# Patient Record
Sex: Female | Born: 1955
Health system: Southern US, Community
[De-identification: ages and names within clinical notes are randomized; demographics above are authoritative.]

## PROBLEM LIST (undated history)

## (undated) DIAGNOSIS — Z9889 Other specified postprocedural states: Secondary | ICD-10-CM

## (undated) DIAGNOSIS — R112 Nausea with vomiting, unspecified: Secondary | ICD-10-CM

## (undated) DIAGNOSIS — I219 Acute myocardial infarction, unspecified: Secondary | ICD-10-CM

## (undated) DIAGNOSIS — K219 Gastro-esophageal reflux disease without esophagitis: Secondary | ICD-10-CM

## (undated) DIAGNOSIS — T7840XA Allergy, unspecified, initial encounter: Secondary | ICD-10-CM

## (undated) DIAGNOSIS — T8859XA Other complications of anesthesia, initial encounter: Secondary | ICD-10-CM

## (undated) DIAGNOSIS — E119 Type 2 diabetes mellitus without complications: Secondary | ICD-10-CM

## (undated) DIAGNOSIS — E785 Hyperlipidemia, unspecified: Secondary | ICD-10-CM

## (undated) DIAGNOSIS — I10 Essential (primary) hypertension: Secondary | ICD-10-CM

## (undated) DIAGNOSIS — I2510 Atherosclerotic heart disease of native coronary artery without angina pectoris: Secondary | ICD-10-CM

## (undated) HISTORY — DX: Acute myocardial infarction, unspecified: I21.9

## (undated) HISTORY — PX: CARDIAC CATHETERIZATION: SHX172

## (undated) HISTORY — PX: OTHER SURGICAL HISTORY: SHX169

## (undated) HISTORY — DX: Allergy, unspecified, initial encounter: T78.40XA

## (undated) HISTORY — DX: Type 2 diabetes mellitus without complications: E11.9

## (undated) HISTORY — DX: Hyperlipidemia, unspecified: E78.5

## (undated) HISTORY — DX: Essential (primary) hypertension: I10

## (undated) HISTORY — DX: Gastro-esophageal reflux disease without esophagitis: K21.9

---

## 1990-03-11 HISTORY — PX: CHOLECYSTECTOMY: SHX55

## 2004-03-11 HISTORY — PX: LAMINECTOMY: SHX219

## 2016-03-11 HISTORY — PX: COLONOSCOPY: SHX174

## 2016-10-04 LAB — HM COLONOSCOPY

## 2018-05-07 ENCOUNTER — Encounter: Payer: Self-pay | Admitting: Neurology

## 2018-05-07 ENCOUNTER — Ambulatory Visit (INDEPENDENT_AMBULATORY_CARE_PROVIDER_SITE_OTHER): Payer: BLUE CROSS/BLUE SHIELD | Admitting: Neurology

## 2018-05-07 VITALS — BP 133/88 | HR 78 | Ht 65.5 in | Wt 153.0 lb

## 2018-05-07 DIAGNOSIS — H02403 Unspecified ptosis of bilateral eyelids: Secondary | ICD-10-CM | POA: Diagnosis not present

## 2018-05-07 NOTE — Progress Notes (Signed)
Reason for visit: Ptosis  Referring physician: Dr. Claybon Jabs is a 63 y.o. female  History of present illness:  Ms. Busalacchi is a 63 year old right-handed white female who noted onset of some discomfort around the right eye about a year ago, she noted swelling around the eye, she has had some problems with ptosis when she becomes very tired that may actually close the eye down.  She had more recently over the last several months have noted some problems with the left eye as well, the left eye will become droopy and will close down completely to the point where she cannot see out of the eye.  She has noted some generalized blurred vision out of both eyes that does not improve with covering one eye or the other.  The patient denies any numbness of the face or the extremities, she has had prior lumbosacral spine surgery and had a right foot drop around the time of surgery that has improved.  The patient does have some increase in generalized fatigue recently.  She has just moved from the [place], [place] area and will be seeing her primary doctor for the first time in the near future.  She gives a history of ehrlichiosis and Lyme disease that was treated several years ago.  The patient was sent to this office for an evaluation of possible myasthenia gravis.  Past Medical History:  Diagnosis Date  . Diabetes mellitus without complication (HCC)   . HLD (hyperlipidemia)     Past Surgical History:  Procedure Laterality Date  . CHOLECYSTECTOMY  1992  . LAMINECTOMY  2006    Family History  Problem Relation Age of Onset  . Diabetes Mother   . Colon cancer Mother   . Prostate cancer Father   . Hypercholesterolemia Father     Social history:  reports that she quit smoking about 5 years ago. She has never used smokeless tobacco. She reports current alcohol use of about 4.0 standard drinks of alcohol per week. She reports previous drug use.  Medications:  Prior to Admission  medications   Medication Sig Start Date End Date Taking? Authorizing Provider  atorvastatin (LIPITOR) 20 MG tablet  04/18/18  Yes [provider]  metFORMIN (GLUCOPHAGE) 1000 MG tablet  03/27/18  Yes [provider]  TRULICITY 0.75 MG/0.5ML SOPN  03/28/18  Yes [provider]     Not on File  ROS:  Out of a complete 14 system review of symptoms, the patient complains only of the following symptoms, and all other reviewed systems are negative.  Fatigue Blurred vision Aching muscles  Blood pressure 133/88, pulse 78, height 5' 5.5" (1.664 m), weight 153 lb (69.4 kg).  Physical Exam  General: The patient is alert and cooperative at the time of the examination.  Eyes: Pupils are equal, round, and reactive to light. Discs are flat bilaterally.  The sclera of the eyes appear to be somewhat inflamed bilaterally, right greater than left.  There may be some slight proptosis on the right.  With superior gaze for 1 minute, no increase in ptosis was noted, no divergence of gaze or subjective double vision was noted.  Neck: The neck is supple, no carotid bruits are noted.  Respiratory: The respiratory examination is clear.  Cardiovascular: The cardiovascular examination reveals a regular rate and rhythm, no obvious murmurs or rubs are noted.  Skin: Extremities are without significant edema.  Neurologic Exam  Mental status: The patient is alert and oriented x  3 at the time of the examination. The patient has apparent normal recent and remote memory, with an apparently normal attention span and concentration ability.  Cranial nerves: Facial symmetry is present. There is good sensation of the face to pinprick and soft touch bilaterally. The strength of the facial muscles and the muscles to head turning and shoulder shrug are normal bilaterally. Speech is well enunciated, no aphasia or dysarthria is noted. Extraocular movements are full. Visual fields are full. The tongue is  midline, and the patient has symmetric elevation of the soft palate. No obvious hearing deficits are noted.  Motor: The motor testing reveals 5 over 5 strength of all 4 extremities. Good symmetric motor tone is noted throughout.  With arms outstretched for 1 minute, no fatigable weakness of the deltoid muscles was noted.  Sensory: Sensory testing is intact to pinprick, soft touch, vibration sensation, and position sense on all 4 extremities, with exception of some decreased pinprick sensation and vibration sensation on the right foot. No evidence of extinction is noted.  Coordination: Cerebellar testing reveals good finger-nose-finger and heel-to-shin bilaterally.  Gait and station: Gait is normal. Tandem gait is normal. Romberg is negative. No drift is seen.  Reflexes: Deep tendon reflexes are symmetric and normal bilaterally. Toes are downgoing bilaterally.   Assessment/Plan:  1.  Reports of bilateral ptosis  The patient will be evaluated for myasthenia gravis.  The clinical examination however does show some swelling around the right eye with inflammatory changes of the sclera of the eyes bilaterally, right greater than left.  There may be some right-sided ptosis.  The patient will need to be evaluated for thyroid eye disease as well.  If the blood work is unremarkable, we may consider MRI of the brain and orbits to further evaluate this issue.  The patient will follow-up in 3 months.  An empiric trial on Mestinon may be done in the future.  Marlan Palau MD 05/07/2018 3:18 PM  [facility] [address] [place], [place] [address]  Phone [phone] Fax [phone]

## 2018-05-11 LAB — ANGIOTENSIN CONVERTING ENZYME: Angio Convert Enzyme: 35 U/L (ref 14–82)

## 2018-05-11 LAB — VITAMIN B12: Vitamin B-12: 576 pg/mL (ref 232–1245)

## 2018-05-11 LAB — TSH: TSH: 0.691 u[IU]/mL (ref 0.450–4.500)

## 2018-05-11 LAB — SEDIMENTATION RATE: Sed Rate: 9 mm/hr (ref 0–40)

## 2018-05-11 LAB — THYROID PEROXIDASE ANTIBODY: Thyroperoxidase Ab SerPl-aCnc: 17 IU/mL (ref 0–34)

## 2018-05-11 LAB — B. BURGDORFI ANTIBODIES: Lyme IgG/IgM Ab: <0.91 {ISR} (ref 0.00–0.90)

## 2018-05-11 LAB — ANA W/REFLEX: Anti Nuclear Antibody(ANA): NEGATIVE

## 2018-05-11 LAB — THYROGLOBULIN ANTIBODY: Thyroglobulin Antibody: <1 IU/mL (ref 0.0–0.9)

## 2018-05-11 LAB — ACETYLCHOLINE RECEPTOR, BINDING: AChR Binding Ab, Serum: <0.03 nmol/L (ref 0.00–0.24)

## 2018-05-12 ENCOUNTER — Telehealth: Payer: Self-pay | Admitting: Neurology

## 2018-05-12 NOTE — Telephone Encounter (Signed)
I called the patient.  The blood work is completely normal.  It is still possible that she may have seronegative myasthenia gravis, if she is amenable to an empiric trial on Mestinon, I will send a prescription in for her.  She is to contact our office if she desires to go on the Mestinon.

## 2018-05-14 ENCOUNTER — Telehealth: Payer: Self-pay | Admitting: Neurology

## 2018-05-14 MED ORDER — PYRIDOSTIGMINE BROMIDE 60 MG PO TABS: ORAL_TABLET | ORAL | 1 refills | Status: DC

## 2018-05-14 NOTE — Telephone Encounter (Signed)
I will call in a prescription for Mestinon taking 30 mg 3 times daily for a couple weeks then go to 60 mg 3 times daily.  If no benefit after another 2 weeks, we will stop the medication entirely.

## 2018-05-14 NOTE — Telephone Encounter (Signed)
Pt called back to in form Dr. Anne Hahn that she is ok with trying the Mestinon. Pt would like it to be sent to the [facility] on [address].

## 2018-05-15 MED ORDER — PYRIDOSTIGMINE BROMIDE 60 MG PO TABS: ORAL_TABLET | ORAL | 1 refills | Status: DC

## 2018-05-15 NOTE — Telephone Encounter (Signed)
RX for mestinon sent to [facility] as requested. I called pt to advise her of this, no answer, left a message asking her to call me back. If pt calls back, please advise her of this.

## 2018-05-15 NOTE — Telephone Encounter (Signed)
Pt has called back because the Mestinon is not at the pharmacy she requested.  Pt is asking that the Mestinon be called into [facility] on [address] [phone]

## 2018-05-15 NOTE — Addendum Note (Signed)
Addended by: Geronimo Running A on: 05/15/2018 12:10 PM   Modules accepted: Orders

## 2018-08-13 ENCOUNTER — Ambulatory Visit: Payer: BLUE CROSS/BLUE SHIELD | Admitting: Neurology

## 2018-09-14 DIAGNOSIS — Z1231 Encounter for screening mammogram for malignant neoplasm of breast: Secondary | ICD-10-CM | POA: Diagnosis not present

## 2018-10-27 DIAGNOSIS — E1169 Type 2 diabetes mellitus with other specified complication: Secondary | ICD-10-CM | POA: Diagnosis not present

## 2018-10-30 DIAGNOSIS — E1169 Type 2 diabetes mellitus with other specified complication: Secondary | ICD-10-CM | POA: Diagnosis not present

## 2018-10-30 DIAGNOSIS — R82998 Other abnormal findings in urine: Secondary | ICD-10-CM | POA: Diagnosis not present

## 2018-11-02 DIAGNOSIS — H02402 Unspecified ptosis of left eyelid: Secondary | ICD-10-CM | POA: Diagnosis not present

## 2018-11-02 DIAGNOSIS — Z0000 Encounter for general adult medical examination without abnormal findings: Secondary | ICD-10-CM | POA: Diagnosis not present

## 2018-11-02 DIAGNOSIS — E1169 Type 2 diabetes mellitus with other specified complication: Secondary | ICD-10-CM | POA: Diagnosis not present

## 2018-11-02 DIAGNOSIS — E7800 Pure hypercholesterolemia, unspecified: Secondary | ICD-10-CM | POA: Diagnosis not present

## 2018-11-25 DIAGNOSIS — Z1212 Encounter for screening for malignant neoplasm of rectum: Secondary | ICD-10-CM | POA: Diagnosis not present

## 2018-12-11 DIAGNOSIS — Z23 Encounter for immunization: Secondary | ICD-10-CM | POA: Diagnosis not present

## 2019-02-25 DIAGNOSIS — E1169 Type 2 diabetes mellitus with other specified complication: Secondary | ICD-10-CM | POA: Diagnosis not present

## 2019-02-25 DIAGNOSIS — E7800 Pure hypercholesterolemia, unspecified: Secondary | ICD-10-CM | POA: Diagnosis not present

## 2019-02-27 DIAGNOSIS — Z20828 Contact with and (suspected) exposure to other viral communicable diseases: Secondary | ICD-10-CM | POA: Diagnosis not present

## 2019-03-07 DIAGNOSIS — Z20828 Contact with and (suspected) exposure to other viral communicable diseases: Secondary | ICD-10-CM | POA: Diagnosis not present

## 2019-03-19 ENCOUNTER — Ambulatory Visit: Payer: PPO | Admitting: Podiatry

## 2019-03-19 ENCOUNTER — Telehealth: Payer: Self-pay | Admitting: *Deleted

## 2019-03-19 ENCOUNTER — Encounter: Payer: Self-pay | Admitting: Podiatry

## 2019-03-19 ENCOUNTER — Other Ambulatory Visit: Payer: Self-pay

## 2019-03-19 DIAGNOSIS — L601 Onycholysis: Secondary | ICD-10-CM | POA: Diagnosis not present

## 2019-03-19 DIAGNOSIS — M79676 Pain in unspecified toe(s): Secondary | ICD-10-CM | POA: Diagnosis not present

## 2019-03-19 DIAGNOSIS — M792 Neuralgia and neuritis, unspecified: Secondary | ICD-10-CM

## 2019-03-19 DIAGNOSIS — E1169 Type 2 diabetes mellitus with other specified complication: Secondary | ICD-10-CM | POA: Diagnosis not present

## 2019-03-19 DIAGNOSIS — M5416 Radiculopathy, lumbar region: Secondary | ICD-10-CM | POA: Diagnosis not present

## 2019-03-19 MED ORDER — NEOMYCIN-POLYMYXIN-HC 3.5-10000-1 OT SOLN: OTIC | 0 refills | Status: DC

## 2019-03-19 MED ORDER — GABAPENTIN 300 MG PO CAPS: 300.0000 mg | ORAL_CAPSULE | Freq: Every day | ORAL | 0 refills | Status: DC

## 2019-03-19 NOTE — Progress Notes (Signed)
  Subjective:  Patient ID: Christina Larsen, female    DOB: Oct 05, 1955,  MRN: [id number]  Chief Complaint  Patient presents with  . Nail Problem    L great toenail; "nail discoloration, thickness, tenderness, noticed nail starting to lift a couple of months ago but been dealing with nail problem for several years; usually get pedicures"   65 y.o. female presents with the above complaint. History confirmed with patient. States she has had numbness in the right great toe since having surgery for her spine. Had dropfoot right, now resolved by surgery.  Objective:  Physical Exam: warm, good capillary refill, nail exam left hallux lysis with dystrophic, no trophic changes or ulcerative lesions and normal DP and PT pulses. Left Foot: normal exam, no swelling, tenderness, instability; ligaments intact, full range of motion of all ankle/foot joints  Right Foot: decreased sensation at right hallux, good strength no residual dropfoot rihgt   Assessment:   1. Onycholysis   2. Lumbar radiculopathy   3. Pain around toenail   4. Neuralgia and neuritis     Plan:  Patient was evaluated and treated and all questions answered.  Nail Onycholysis -Patient elects to proceed with toenail removal today -Nail Avulsed. See procedure note. -Educated on post-procedure care including soaking. Written instructions provided. -Patient to follow up in 2 weeks for nail check.  Procedure: Avulsion of toenail Location: Left 1st toe  Anesthesia: Lidocaine 1% plain; 1.5 mL and Marcaine 0.5% plain; 1.5 mL, digital block. Skin Prep: Betadine. Dressing: Silvadene; telfa; dry, sterile, compression dressing. Technique: Following skin prep, the toe was exsanguinated and a tourniquet was secured at the base of the toe. The nail was freed and avulsed with a hemostat. The area was cleansed. The tourniquet was then removed and sterile dressing applied. Disposition: Patient tolerated procedure well.    Neuralgia, 2/2 Lumbar  radiculopathy and subsequent surgery -Discused treatment options. Discussed trial of gabapentin. Discussed R/b/a. Patient would like to trial. Discussed likely no definitive treatment but we could be focused on treatment management.   Return in about 2 weeks (around 04/02/2019).   MDM

## 2019-03-19 NOTE — Telephone Encounter (Signed)
Patient is wanting to know if it is ok, just removed sock and it is covered in blood,appointment today and had great toenail removed.

## 2019-03-19 NOTE — Telephone Encounter (Signed)
It was probably just some bleeding through the dressing - she can wrap it in more gauze, elevate and it should stop

## 2019-03-19 NOTE — Patient Instructions (Signed)

## 2019-03-20 ENCOUNTER — Telehealth: Payer: Self-pay | Admitting: *Deleted

## 2019-03-20 NOTE — Telephone Encounter (Signed)
Patient called yesterday January 8 and had a nail procedure done by Dr Samuella Cota and the patient called back in the afternoon and stated that the sock was bloody after removing the sock and just wanted to know was that normal and patient was also soaking the bandage and per Dr Samuella Cota that was normal and I stated to the patient if she was trying to get the bandage off to soak 20 minutes due to the bandage could stick to the area and then pat the area dry and apply neosporin and a bandage or could use a band aid and to call the office if any concerns or questions at [phone]. Misty Stanley

## 2019-03-22 ENCOUNTER — Telehealth: Payer: Self-pay | Admitting: *Deleted

## 2019-03-22 NOTE — Telephone Encounter (Signed)
Returned call to patient per Dr. Samuella Cota message, no answer, could not leave Vmessage(voicemail not set up).

## 2019-03-22 NOTE — Telephone Encounter (Signed)
Pt called states she thought she had left her phone number. I told pt that she could take ibuprofen 800mg  3 times a day for the discomfort, if she was able to tolerate the ibuprofen and rest and elevate when possible. Pt states she began the soaking Friday due to her dressing was soaked with blood.

## 2019-03-22 NOTE — Telephone Encounter (Signed)
Left message informing pt I was not certain if she had called but had reviewed Dr. Kandice Hams 03/19/2019 pt schedule and she had a toenail procedure and I was calling to have her call again, if she was the caller at 10:29am and again 1:30pm, both calls the name was difficult to understand the last name and no phone number was given or date of birth, if I had contacted the correct pt to please call again, if not I was sorry for the long message.

## 2019-03-23 ENCOUNTER — Telehealth: Payer: Self-pay | Admitting: Podiatry

## 2019-03-23 NOTE — Telephone Encounter (Signed)
Pt had an ingrown toenail removed on Friday and states she went to work yesterday and after being up on her foot all day her toe became irritated and very painful. Pt would like to know if the doctor can write her a note to be out of work for today and tomorrow.   Please give patient a call if this is something we can do and fax the note to [phone] Attn: Nelva Bush

## 2019-03-23 NOTE — Telephone Encounter (Signed)
Left message informing pt I would fax a letter to [phone] Attn: Nelva Bush with her requested out of work days.

## 2019-04-01 ENCOUNTER — Ambulatory Visit: Payer: PPO | Admitting: Podiatry

## 2019-04-01 ENCOUNTER — Other Ambulatory Visit: Payer: Self-pay

## 2019-04-01 DIAGNOSIS — L601 Onycholysis: Secondary | ICD-10-CM | POA: Diagnosis not present

## 2019-04-01 DIAGNOSIS — M79676 Pain in unspecified toe(s): Secondary | ICD-10-CM

## 2019-04-01 NOTE — Progress Notes (Signed)
  Subjective:  Patient ID: Christina Larsen, female    DOB: 01-Feb-1956,  MRN: [id number]  No chief complaint on file.  64 y.o. female presents for follow up of nail procedure. History confirmed with patient.   Objective:  Physical Exam: Ingrown nail avulsion site: overlying soft crust, no warmth, no drainage and no erythema Assessment:   1. Onycholysis   2. Pain around toenail    Plan:  Patient was evaluated and treated and all questions answered.  S/p Ingrown Toenail Excision, right -Healing well without issue. -Discussed return precautions. -F/u PRN

## 2019-04-02 DIAGNOSIS — G5601 Carpal tunnel syndrome, right upper limb: Secondary | ICD-10-CM | POA: Diagnosis not present

## 2019-04-02 DIAGNOSIS — M79641 Pain in right hand: Secondary | ICD-10-CM | POA: Diagnosis not present

## 2019-04-12 DIAGNOSIS — G5601 Carpal tunnel syndrome, right upper limb: Secondary | ICD-10-CM | POA: Diagnosis not present

## 2019-04-28 DIAGNOSIS — G5601 Carpal tunnel syndrome, right upper limb: Secondary | ICD-10-CM | POA: Diagnosis not present

## 2019-06-21 DIAGNOSIS — E7800 Pure hypercholesterolemia, unspecified: Secondary | ICD-10-CM | POA: Diagnosis not present

## 2019-06-21 DIAGNOSIS — G5600 Carpal tunnel syndrome, unspecified upper limb: Secondary | ICD-10-CM | POA: Diagnosis not present

## 2019-06-21 DIAGNOSIS — M25519 Pain in unspecified shoulder: Secondary | ICD-10-CM | POA: Diagnosis not present

## 2019-06-21 DIAGNOSIS — E1169 Type 2 diabetes mellitus with other specified complication: Secondary | ICD-10-CM | POA: Diagnosis not present

## 2019-06-22 DIAGNOSIS — M25511 Pain in right shoulder: Secondary | ICD-10-CM | POA: Diagnosis not present

## 2019-06-22 DIAGNOSIS — M25512 Pain in left shoulder: Secondary | ICD-10-CM | POA: Diagnosis not present

## 2019-07-28 DIAGNOSIS — M25511 Pain in right shoulder: Secondary | ICD-10-CM | POA: Diagnosis not present

## 2019-07-28 DIAGNOSIS — G5601 Carpal tunnel syndrome, right upper limb: Secondary | ICD-10-CM | POA: Diagnosis not present

## 2019-07-29 DIAGNOSIS — M25511 Pain in right shoulder: Secondary | ICD-10-CM | POA: Diagnosis not present

## 2019-08-04 DIAGNOSIS — M25511 Pain in right shoulder: Secondary | ICD-10-CM | POA: Diagnosis not present

## 2019-08-04 DIAGNOSIS — M25512 Pain in left shoulder: Secondary | ICD-10-CM | POA: Diagnosis not present

## 2019-08-13 DIAGNOSIS — M75111 Incomplete rotator cuff tear or rupture of right shoulder, not specified as traumatic: Secondary | ICD-10-CM | POA: Diagnosis not present

## 2019-08-13 DIAGNOSIS — G5601 Carpal tunnel syndrome, right upper limb: Secondary | ICD-10-CM | POA: Diagnosis not present

## 2019-08-13 DIAGNOSIS — M75110 Incomplete rotator cuff tear or rupture of unspecified shoulder, not specified as traumatic: Secondary | ICD-10-CM | POA: Insufficient documentation

## 2019-08-24 DIAGNOSIS — I10 Essential (primary) hypertension: Secondary | ICD-10-CM | POA: Diagnosis not present

## 2019-08-24 DIAGNOSIS — E1169 Type 2 diabetes mellitus with other specified complication: Secondary | ICD-10-CM | POA: Diagnosis not present

## 2019-08-24 DIAGNOSIS — M25519 Pain in unspecified shoulder: Secondary | ICD-10-CM | POA: Diagnosis not present

## 2019-08-24 DIAGNOSIS — G5600 Carpal tunnel syndrome, unspecified upper limb: Secondary | ICD-10-CM | POA: Diagnosis not present

## 2019-08-26 DIAGNOSIS — G8918 Other acute postprocedural pain: Secondary | ICD-10-CM | POA: Diagnosis not present

## 2019-08-26 DIAGNOSIS — M659 Synovitis and tenosynovitis, unspecified: Secondary | ICD-10-CM | POA: Diagnosis not present

## 2019-08-26 DIAGNOSIS — G5601 Carpal tunnel syndrome, right upper limb: Secondary | ICD-10-CM | POA: Diagnosis not present

## 2019-08-26 DIAGNOSIS — M19011 Primary osteoarthritis, right shoulder: Secondary | ICD-10-CM | POA: Diagnosis not present

## 2019-08-26 DIAGNOSIS — M7541 Impingement syndrome of right shoulder: Secondary | ICD-10-CM | POA: Diagnosis not present

## 2019-08-26 DIAGNOSIS — M25511 Pain in right shoulder: Secondary | ICD-10-CM | POA: Diagnosis not present

## 2019-08-26 DIAGNOSIS — M7521 Bicipital tendinitis, right shoulder: Secondary | ICD-10-CM | POA: Diagnosis not present

## 2019-08-26 DIAGNOSIS — M24111 Other articular cartilage disorders, right shoulder: Secondary | ICD-10-CM | POA: Diagnosis not present

## 2019-08-26 DIAGNOSIS — R600 Localized edema: Secondary | ICD-10-CM | POA: Diagnosis not present

## 2019-08-26 DIAGNOSIS — M75111 Incomplete rotator cuff tear or rupture of right shoulder, not specified as traumatic: Secondary | ICD-10-CM | POA: Diagnosis not present

## 2019-08-26 DIAGNOSIS — Z4889 Encounter for other specified surgical aftercare: Secondary | ICD-10-CM | POA: Diagnosis not present

## 2019-09-08 DIAGNOSIS — G5601 Carpal tunnel syndrome, right upper limb: Secondary | ICD-10-CM | POA: Diagnosis not present

## 2019-09-08 DIAGNOSIS — Z4789 Encounter for other orthopedic aftercare: Secondary | ICD-10-CM | POA: Diagnosis not present

## 2019-09-08 DIAGNOSIS — M25511 Pain in right shoulder: Secondary | ICD-10-CM | POA: Diagnosis not present

## 2019-09-08 DIAGNOSIS — M75111 Incomplete rotator cuff tear or rupture of right shoulder, not specified as traumatic: Secondary | ICD-10-CM | POA: Diagnosis not present

## 2019-09-15 DIAGNOSIS — M25511 Pain in right shoulder: Secondary | ICD-10-CM | POA: Diagnosis not present

## 2019-09-16 DIAGNOSIS — E1169 Type 2 diabetes mellitus with other specified complication: Secondary | ICD-10-CM | POA: Diagnosis not present

## 2019-09-16 DIAGNOSIS — I10 Essential (primary) hypertension: Secondary | ICD-10-CM | POA: Diagnosis not present

## 2019-09-16 DIAGNOSIS — M25519 Pain in unspecified shoulder: Secondary | ICD-10-CM | POA: Diagnosis not present

## 2019-09-16 DIAGNOSIS — G5600 Carpal tunnel syndrome, unspecified upper limb: Secondary | ICD-10-CM | POA: Diagnosis not present

## 2019-09-17 DIAGNOSIS — M25511 Pain in right shoulder: Secondary | ICD-10-CM | POA: Diagnosis not present

## 2019-09-21 DIAGNOSIS — M25511 Pain in right shoulder: Secondary | ICD-10-CM | POA: Diagnosis not present

## 2019-09-23 DIAGNOSIS — M25511 Pain in right shoulder: Secondary | ICD-10-CM | POA: Diagnosis not present

## 2019-09-28 DIAGNOSIS — M25511 Pain in right shoulder: Secondary | ICD-10-CM | POA: Diagnosis not present

## 2019-09-30 DIAGNOSIS — M25511 Pain in right shoulder: Secondary | ICD-10-CM | POA: Diagnosis not present

## 2019-10-06 DIAGNOSIS — G5601 Carpal tunnel syndrome, right upper limb: Secondary | ICD-10-CM | POA: Diagnosis not present

## 2019-10-06 DIAGNOSIS — M25511 Pain in right shoulder: Secondary | ICD-10-CM | POA: Diagnosis not present

## 2019-10-06 DIAGNOSIS — Z4789 Encounter for other orthopedic aftercare: Secondary | ICD-10-CM | POA: Diagnosis not present

## 2019-10-06 DIAGNOSIS — M75111 Incomplete rotator cuff tear or rupture of right shoulder, not specified as traumatic: Secondary | ICD-10-CM | POA: Diagnosis not present

## 2019-10-08 DIAGNOSIS — M25511 Pain in right shoulder: Secondary | ICD-10-CM | POA: Diagnosis not present

## 2019-10-14 DIAGNOSIS — M25511 Pain in right shoulder: Secondary | ICD-10-CM | POA: Diagnosis not present

## 2019-10-18 DIAGNOSIS — M25511 Pain in right shoulder: Secondary | ICD-10-CM | POA: Diagnosis not present

## 2019-10-22 DIAGNOSIS — M25511 Pain in right shoulder: Secondary | ICD-10-CM | POA: Diagnosis not present

## 2019-10-26 DIAGNOSIS — M25511 Pain in right shoulder: Secondary | ICD-10-CM | POA: Diagnosis not present

## 2019-10-28 DIAGNOSIS — E1169 Type 2 diabetes mellitus with other specified complication: Secondary | ICD-10-CM | POA: Diagnosis not present

## 2019-10-28 DIAGNOSIS — E7800 Pure hypercholesterolemia, unspecified: Secondary | ICD-10-CM | POA: Diagnosis not present

## 2019-10-29 DIAGNOSIS — M25511 Pain in right shoulder: Secondary | ICD-10-CM | POA: Diagnosis not present

## 2019-11-01 DIAGNOSIS — G5600 Carpal tunnel syndrome, unspecified upper limb: Secondary | ICD-10-CM | POA: Diagnosis not present

## 2019-11-01 DIAGNOSIS — E1169 Type 2 diabetes mellitus with other specified complication: Secondary | ICD-10-CM | POA: Diagnosis not present

## 2019-11-01 DIAGNOSIS — R82998 Other abnormal findings in urine: Secondary | ICD-10-CM | POA: Diagnosis not present

## 2019-11-01 DIAGNOSIS — E7800 Pure hypercholesterolemia, unspecified: Secondary | ICD-10-CM | POA: Diagnosis not present

## 2019-11-01 DIAGNOSIS — I10 Essential (primary) hypertension: Secondary | ICD-10-CM | POA: Diagnosis not present

## 2019-11-01 DIAGNOSIS — Z1212 Encounter for screening for malignant neoplasm of rectum: Secondary | ICD-10-CM | POA: Diagnosis not present

## 2019-11-01 DIAGNOSIS — Z0000 Encounter for general adult medical examination without abnormal findings: Secondary | ICD-10-CM | POA: Diagnosis not present

## 2019-11-02 DIAGNOSIS — M25511 Pain in right shoulder: Secondary | ICD-10-CM | POA: Diagnosis not present

## 2019-11-04 DIAGNOSIS — M25511 Pain in right shoulder: Secondary | ICD-10-CM | POA: Diagnosis not present

## 2019-11-09 DIAGNOSIS — M25511 Pain in right shoulder: Secondary | ICD-10-CM | POA: Diagnosis not present

## 2019-11-09 DIAGNOSIS — G5601 Carpal tunnel syndrome, right upper limb: Secondary | ICD-10-CM | POA: Diagnosis not present

## 2019-11-12 DIAGNOSIS — M75111 Incomplete rotator cuff tear or rupture of right shoulder, not specified as traumatic: Secondary | ICD-10-CM | POA: Diagnosis not present

## 2019-11-12 DIAGNOSIS — Z4789 Encounter for other orthopedic aftercare: Secondary | ICD-10-CM | POA: Diagnosis not present

## 2019-11-12 DIAGNOSIS — G5601 Carpal tunnel syndrome, right upper limb: Secondary | ICD-10-CM | POA: Diagnosis not present

## 2019-11-23 DIAGNOSIS — Z01419 Encounter for gynecological examination (general) (routine) without abnormal findings: Secondary | ICD-10-CM | POA: Diagnosis not present

## 2019-11-23 DIAGNOSIS — Z1151 Encounter for screening for human papillomavirus (HPV): Secondary | ICD-10-CM | POA: Diagnosis not present

## 2019-11-23 DIAGNOSIS — Z124 Encounter for screening for malignant neoplasm of cervix: Secondary | ICD-10-CM | POA: Diagnosis not present

## 2019-11-24 DIAGNOSIS — Z4789 Encounter for other orthopedic aftercare: Secondary | ICD-10-CM | POA: Diagnosis not present

## 2019-11-24 DIAGNOSIS — M75111 Incomplete rotator cuff tear or rupture of right shoulder, not specified as traumatic: Secondary | ICD-10-CM | POA: Diagnosis not present

## 2019-11-24 DIAGNOSIS — M25511 Pain in right shoulder: Secondary | ICD-10-CM | POA: Diagnosis not present

## 2019-11-24 DIAGNOSIS — G5601 Carpal tunnel syndrome, right upper limb: Secondary | ICD-10-CM | POA: Diagnosis not present

## 2019-12-03 DIAGNOSIS — G5601 Carpal tunnel syndrome, right upper limb: Secondary | ICD-10-CM | POA: Diagnosis not present

## 2019-12-03 DIAGNOSIS — M75111 Incomplete rotator cuff tear or rupture of right shoulder, not specified as traumatic: Secondary | ICD-10-CM | POA: Diagnosis not present

## 2019-12-21 DIAGNOSIS — M75111 Incomplete rotator cuff tear or rupture of right shoulder, not specified as traumatic: Secondary | ICD-10-CM | POA: Diagnosis not present

## 2019-12-21 DIAGNOSIS — G5601 Carpal tunnel syndrome, right upper limb: Secondary | ICD-10-CM | POA: Diagnosis not present

## 2019-12-24 DIAGNOSIS — M75111 Incomplete rotator cuff tear or rupture of right shoulder, not specified as traumatic: Secondary | ICD-10-CM | POA: Diagnosis not present

## 2019-12-24 DIAGNOSIS — Z1231 Encounter for screening mammogram for malignant neoplasm of breast: Secondary | ICD-10-CM | POA: Diagnosis not present

## 2020-01-10 DIAGNOSIS — G8918 Other acute postprocedural pain: Secondary | ICD-10-CM | POA: Diagnosis not present

## 2020-01-10 DIAGNOSIS — M75121 Complete rotator cuff tear or rupture of right shoulder, not specified as traumatic: Secondary | ICD-10-CM | POA: Diagnosis not present

## 2020-01-10 DIAGNOSIS — M19011 Primary osteoarthritis, right shoulder: Secondary | ICD-10-CM | POA: Diagnosis not present

## 2020-01-10 DIAGNOSIS — M7551 Bursitis of right shoulder: Secondary | ICD-10-CM | POA: Diagnosis not present

## 2020-01-10 DIAGNOSIS — M659 Synovitis and tenosynovitis, unspecified: Secondary | ICD-10-CM | POA: Diagnosis not present

## 2020-01-10 DIAGNOSIS — M958 Other specified acquired deformities of musculoskeletal system: Secondary | ICD-10-CM | POA: Diagnosis not present

## 2020-01-16 ENCOUNTER — Other Ambulatory Visit: Payer: Self-pay

## 2020-01-16 ENCOUNTER — Encounter (HOSPITAL_BASED_OUTPATIENT_CLINIC_OR_DEPARTMENT_OTHER): Payer: Self-pay | Admitting: *Deleted

## 2020-01-16 ENCOUNTER — Emergency Department (HOSPITAL_BASED_OUTPATIENT_CLINIC_OR_DEPARTMENT_OTHER): Payer: PPO

## 2020-01-16 ENCOUNTER — Inpatient Hospital Stay (HOSPITAL_BASED_OUTPATIENT_CLINIC_OR_DEPARTMENT_OTHER)
Admission: EM | Admit: 2020-01-16 | Discharge: 2020-01-19 | DRG: 247 | Disposition: A | Discharge status: Home / Self Care | Payer: PPO | Attending: Cardiovascular Disease | Admitting: Cardiovascular Disease

## 2020-01-16 DIAGNOSIS — Q245 Malformation of coronary vessels: Secondary | ICD-10-CM | POA: Diagnosis not present

## 2020-01-16 DIAGNOSIS — I472 Ventricular tachycardia: Secondary | ICD-10-CM | POA: Diagnosis not present

## 2020-01-16 DIAGNOSIS — Z885 Allergy status to narcotic agent status: Secondary | ICD-10-CM

## 2020-01-16 DIAGNOSIS — I2582 Chronic total occlusion of coronary artery: Secondary | ICD-10-CM | POA: Diagnosis present

## 2020-01-16 DIAGNOSIS — E785 Hyperlipidemia, unspecified: Secondary | ICD-10-CM | POA: Diagnosis present

## 2020-01-16 DIAGNOSIS — E1165 Type 2 diabetes mellitus with hyperglycemia: Secondary | ICD-10-CM | POA: Diagnosis present

## 2020-01-16 DIAGNOSIS — Z8249 Family history of ischemic heart disease and other diseases of the circulatory system: Secondary | ICD-10-CM | POA: Diagnosis not present

## 2020-01-16 DIAGNOSIS — E119 Type 2 diabetes mellitus without complications: Secondary | ICD-10-CM | POA: Diagnosis not present

## 2020-01-16 DIAGNOSIS — Z833 Family history of diabetes mellitus: Secondary | ICD-10-CM

## 2020-01-16 DIAGNOSIS — J9811 Atelectasis: Secondary | ICD-10-CM | POA: Diagnosis not present

## 2020-01-16 DIAGNOSIS — Z7984 Long term (current) use of oral hypoglycemic drugs: Secondary | ICD-10-CM | POA: Diagnosis not present

## 2020-01-16 DIAGNOSIS — I10 Essential (primary) hypertension: Secondary | ICD-10-CM | POA: Diagnosis present

## 2020-01-16 DIAGNOSIS — Z7901 Long term (current) use of anticoagulants: Secondary | ICD-10-CM

## 2020-01-16 DIAGNOSIS — Z83438 Family history of other disorder of lipoprotein metabolism and other lipidemia: Secondary | ICD-10-CM

## 2020-01-16 DIAGNOSIS — F17200 Nicotine dependence, unspecified, uncomplicated: Secondary | ICD-10-CM | POA: Diagnosis present

## 2020-01-16 DIAGNOSIS — Z955 Presence of coronary angioplasty implant and graft: Secondary | ICD-10-CM

## 2020-01-16 DIAGNOSIS — Z79899 Other long term (current) drug therapy: Secondary | ICD-10-CM | POA: Diagnosis not present

## 2020-01-16 DIAGNOSIS — R079 Chest pain, unspecified: Secondary | ICD-10-CM | POA: Diagnosis not present

## 2020-01-16 DIAGNOSIS — Z20822 Contact with and (suspected) exposure to covid-19: Secondary | ICD-10-CM | POA: Diagnosis not present

## 2020-01-16 DIAGNOSIS — I214 Non-ST elevation (NSTEMI) myocardial infarction: Principal | ICD-10-CM | POA: Diagnosis present

## 2020-01-16 DIAGNOSIS — Z7982 Long term (current) use of aspirin: Secondary | ICD-10-CM | POA: Diagnosis not present

## 2020-01-16 DIAGNOSIS — I77810 Thoracic aortic ectasia: Secondary | ICD-10-CM | POA: Diagnosis not present

## 2020-01-16 DIAGNOSIS — Z23 Encounter for immunization: Secondary | ICD-10-CM

## 2020-01-16 DIAGNOSIS — I2510 Atherosclerotic heart disease of native coronary artery without angina pectoris: Secondary | ICD-10-CM | POA: Diagnosis present

## 2020-01-16 HISTORY — DX: Atherosclerotic heart disease of native coronary artery without angina pectoris: I25.10

## 2020-01-16 LAB — CBC
HCT: 45.6 % (ref 36.0–46.0)
Hemoglobin: 15.6 g/dL — ABNORMAL HIGH (ref 12.0–15.0)
MCH: 31.5 pg (ref 26.0–34.0)
MCHC: 34.2 g/dL (ref 30.0–36.0)
MCV: 91.9 fL (ref 80.0–100.0)
Platelets: 257 10*3/uL (ref 150–400)
RBC: 4.96 MIL/uL (ref 3.87–5.11)
RDW: 13.7 % (ref 11.5–15.5)
WBC: 13.2 10*3/uL — ABNORMAL HIGH (ref 4.0–10.5)
nRBC: 0 % (ref 0.0–0.2)

## 2020-01-16 MED ORDER — NITROGLYCERIN 2 % TD OINT
1.0000 [in_us] | TOPICAL_OINTMENT | Freq: Once | TRANSDERMAL | Status: AC
  Administered 2020-01-16: 1 [in_us] via TOPICAL
  Filled 2020-01-16: qty 1

## 2020-01-16 MED ORDER — ONDANSETRON HCL 4 MG/2ML IJ SOLN
4.0000 mg | Freq: Once | INTRAMUSCULAR | Status: AC
  Administered 2020-01-16: 4 mg via INTRAVENOUS
  Filled 2020-01-16: qty 2

## 2020-01-16 MED ORDER — ASPIRIN 81 MG PO CHEW
324.0000 mg | CHEWABLE_TABLET | Freq: Once | ORAL | Status: AC
  Administered 2020-01-16: 324 mg via ORAL
  Filled 2020-01-16: qty 4

## 2020-01-16 NOTE — ED Provider Notes (Signed)
[facility] EMERGENCY DEPARTMENT Provider Note   CSN: [id number] Arrival date & time: 01/16/20  2252     History Chief Complaint  Patient presents with  . Chest Pain    Christina Larsen is a 64 y.o. female.  HPI  HPI: A 64 year old patient with a history of treated diabetes, hypertension and hypercholesterolemia presents for evaluation of chest pain. Initial onset of pain was approximately 1-3 hours ago. The patient's chest pain is described as heaviness/pressure/tightness and is not worse with exertion. The patient complains of nausea. The patient's chest pain is middle- or left-sided, is not well-localized, is not sharp and does not radiate to the arms/jaw/neck. The patient denies diaphoresis. The patient has no history of stroke, has no history of peripheral artery disease, has not smoked in the past 90 days, has no relevant family history of coronary artery disease (first degree relative at less than age 35) and does not have an elevated BMI (>=30).   This is a 64 year old female with a history of diabetes, HTN, smoking and hyperlipidemia who presents with chest pain. Patient reports that she was watching [place] this evening when she had sudden onset of anterior substernal chest pain that she describes as pressure. It started around 9:30 PM. She had associated dry heaves and vomiting. No shortness of breath or sweating. Pain is not necessarily exertional but she states that she has not been exerting herself because of recent rotator cuff surgery. She is not noted any leg swelling or pleurisy to the pain. She rates her pain currently at 5 out of 10. She has not taken anything for her symptoms.  Past Medical History:  Diagnosis Date  . Diabetes mellitus without complication (HCC)   . HLD (hyperlipidemia)     There are no problems to display for this patient.   Past Surgical History:  Procedure Laterality Date  . CHOLECYSTECTOMY  1992  . LAMINECTOMY  2006  . rotator cuff  surgery       OB History   No obstetric history on file.     Family History  Problem Relation Age of Onset  . Diabetes Mother   . Colon cancer Mother   . Prostate cancer Father   . Hypercholesterolemia Father   . Hypertension Brother     Social History   Tobacco Use  . Smoking status: Current Some Day Smoker    Last attempt to quit: 2015    Years since quitting: 6.8  . Smokeless tobacco: Never Used  Substance Use Topics  . Alcohol use: Yes    Alcohol/week: 4.0 standard drinks    Types: 4 Glasses of wine per week  . Drug use: Not Currently    Home Medications Prior to Admission medications   Medication Sig Start Date End Date Taking? Authorizing Provider  atorvastatin (LIPITOR) 20 MG tablet  04/18/18   [provider]  gabapentin (NEURONTIN) 300 MG capsule Take 1 capsule (300 mg total) by mouth at bedtime. 03/19/19   Park Liter, DPM  metFORMIN (GLUCOPHAGE) 1000 MG tablet  03/27/18   [provider]  pyridostigmine (MESTINON) 60 MG tablet 1/2 tablet 3 times daily for 2 weeks, then take 1 full tablet 3 times daily. 05/15/18   York Spaniel, MD  TRULICITY 0.75 MG/0.5ML [facility]  03/28/18   [provider]    Allergies    Hydrocodone-acetaminophen  Review of Systems   Review of Systems  Constitutional: Negative for fever.  Respiratory: Negative for cough and shortness of  breath.   Cardiovascular: Positive for chest pain. Negative for palpitations and leg swelling.  Gastrointestinal: Positive for nausea and vomiting. Negative for abdominal pain.  Genitourinary: Negative for dysuria.  All other systems reviewed and are negative.   Physical Exam Updated Vital Signs BP (!) 133/92   Pulse 74   Temp 97.7 F (36.5 C) (Oral)   Resp 16   Ht 1.651 m (5\' 5" )   Wt 67.6 kg   SpO2 94%   BMI 24.79 kg/m   Physical Exam Vitals and nursing note reviewed.  Constitutional:      Appearance: She is well-developed. She is not toxic-appearing or  diaphoretic.  HENT:     Head: Normocephalic and atraumatic.  Eyes:     Pupils: Pupils are equal, round, and reactive to light.  Cardiovascular:     Rate and Rhythm: Normal rate and regular rhythm.     Heart sounds: Normal heart sounds.  Pulmonary:     Effort: Pulmonary effort is normal. No respiratory distress.     Breath sounds: No wheezing.     Comments: Good air movement, occasional wheeze Abdominal:     General: Bowel sounds are normal.     Palpations: Abdomen is soft.  Musculoskeletal:     Cervical back: Neck supple.     Right lower leg: No tenderness. No edema.     Left lower leg: No tenderness. No edema.     Comments: Right upper extremity in sling  Skin:    General: Skin is warm and dry.  Neurological:     Mental Status: She is alert and oriented to person, place, and time.  Psychiatric:        Mood and Affect: Mood normal.     ED Results / Procedures / Treatments   Labs (all labs ordered are listed, but only abnormal results are displayed) Labs Reviewed  BASIC METABOLIC PANEL - Abnormal; Notable for the following components:      Result Value   Glucose, Bld 172 (*)    All other components within normal limits  CBC - Abnormal; Notable for the following components:   WBC 13.2 (*)    Hemoglobin 15.6 (*)    All other components within normal limits  D-DIMER, QUANTITATIVE (NOT AT [facility]) - Abnormal; Notable for the following components:   D-Dimer, Quant 0.54 (*)    All other components within normal limits  TROPONIN I (HIGH SENSITIVITY) - Abnormal; Notable for the following components:   Troponin I (High Sensitivity) 85 (*)    All other components within normal limits  RESPIRATORY PANEL BY RT PCR (FLU A&B, COVID)  TROPONIN I (HIGH SENSITIVITY)    EKG EKG Interpretation  Date/Time:  Sunday January 16 2020 23:14:15 EST Ventricular Rate:  67 PR Interval:    QRS Duration: 103 QT Interval:  392 QTC Calculation: 414 R Axis:   27 Text Interpretation: Sinus  rhythm Low voltage, precordial leads Nonspecific repolarization abnormalities ST elevation, consider inferior injury, anterior depression anteriorly No STEMI Confirmed by [id number] (Ross Marcus) on 01/16/2020 11:18:36 PM   EKG Interpretation  Date/Time:  Monday January 17 2020 00:04:47 EST Ventricular Rate:  73 PR Interval:    QRS Duration: 95 QT Interval:  404 QTC Calculation: 446 R Axis:   21 Text Interpretation: Sinus rhythm Low voltage, precordial leads Borderline ST depression, anterolateral leads Anterior depression slightly improved, no dynamic changes Confirmed by [id number] (Ross Marcus) on 01/17/2020 12:48:32 AM        Radiology DG Chest  Port 1 View  Result Date: 01/16/2020 CLINICAL DATA:  Substernal chest pain EXAM: PORTABLE CHEST 1 VIEW COMPARISON:  None. FINDINGS: The heart size and mediastinal contours are within normal limits. Aortic knob calcifications are seen. Subsegmental atelectasis seen at both lung bases. The visualized skeletal structures are unremarkable. IMPRESSION: No active disease. Electronically Signed   By: Jonna ClarkBindu  Avutu M.D.   On: 01/16/2020 23:47    Procedures .Critical Care Performed by: Shon BatonHorton, Jamicheal Heard F, MD Authorized by: Shon BatonHorton, Myleah Cavendish F, MD   Critical care provider statement:    Critical care time (minutes):  60   Critical care was necessary to treat or prevent imminent or life-threatening deterioration of the following conditions:  Cardiac failure   Critical care was time spent personally by me on the following activities:  Discussions with consultants, evaluation of patient's response to treatment, examination of patient, ordering and performing treatments and interventions, ordering and review of laboratory studies, ordering and review of radiographic studies, pulse oximetry, re-evaluation of patient's condition, obtaining history from patient or surrogate and review of old charts   (including critical care time)  Medications Ordered in  ED Medications  nitroGLYCERIN 50 mg in dextrose 5 % 250 mL (0.2 mg/mL) infusion (10 mcg/min Intravenous Rate/Dose Change 01/17/20 0023)  heparin ADULT infusion 100 units/mL (25000 units/21450mL sodium chloride 0.45%) (has no administration in time range)  0.9 %  sodium chloride infusion (has no administration in time range)  morphine 4 MG/ML injection 4 mg (has no administration in time range)  nitroGLYCERIN (NITROGLYN) 2 % ointment 1 inch (0 inches Topical Hold 01/17/20 0020)  aspirin chewable tablet 324 mg (324 mg Oral Given 01/16/20 2334)  ondansetron (ZOFRAN) injection 4 mg (4 mg Intravenous Given 01/16/20 2351)    ED Course  I have reviewed the triage vital signs and the nursing notes.  Pertinent labs & imaging results that were available during my care of the patient were reviewed by me and considered in my medical decision making (see chart for details).  Clinical Course as of Jan 16 46  [facility]  [id number] Spoke with cardiology fellow, Dr. Okey Dupreose.  Agrees the EKG findings are concerning for ischemic etiology of patient's pain in addition to the troponin.  We will plan for ED to ED transfer as there are no progressive beds available.  Dr. Myrtis SerKatz was the accepting ED physician.   [CH]  [phone] After IV nitroglycerin and 4 mg of morphine, patient's pain level is down to 5 out of 10.  She and her neighbor were updated.   [CH]    Clinical Course User Index [CH] Keigen Caddell, Mayer Maskerourtney F, MD   MDM Rules/Calculators/A&P HEAR Score: 6                        Patient presents with chest pain.  Onset less than 3 hours ago.  She is nontoxic-appearing.  Vital signs notable for a blood pressure of 143/96.  The character of her pain is concerning a pressure-like pain.  She recently also had surgery.  Although she does not have any leg swelling or pleuritic nature of pain, PE would also be consideration.  She certainly has risk factors for ACS.  EKG is sinus rhythm.  She has some anterior depressions but no ST  elevations.  Does not meet STEMI criteria but is concerning.  There is no prior for comparison.  Patient was given aspirin, nitro ointment was placed for pain control.  Chest x-ray  shows no evidence of pneumothorax or pneumonia.  No mediastinal widening to suggest dissection.  Lab work obtained.  Slight hyperglycemia at 172.  D-dimer is 0.54 which is considered negative when using age-adjusted cutoffs as the patient is 64 years old.  Given that she has no other objective findings of a DVT, have higher suspicion for ACS than PE.  Troponin is 85.  Patient was started on IV heparin.  She has had some continued discomfort.  She was started on IV nitroglycerin and morphine.  Will consult with cardiology as patient's presentation is most consistent with an NSTEMI.  12:49 AM See clinical course above.  Patient does have some ongoing pain.  Given lack of available appropriate beds.  Will ED to ED transfer for cardiology evaluation.  Anticipate cardiac catheterization urgently later this morning.  Patient and friend were updated at the bedside.  Final Clinical Impression(s) / ED Diagnoses Final diagnoses:  NSTEMI (non-ST elevated myocardial infarction) [facility])    Rx / DC Orders ED Discharge Orders    None       Hameed Kolar, Mayer Masker, MD 01/17/20 [phone]

## 2020-01-16 NOTE — ED Triage Notes (Signed)
Pt brought in by her neighbor. Pt reports she had rotator cuff surgery last Monday. Tonight she had onset of substernal chest pain around 2130. Pt with dry heaves and vomiting upon arrival

## 2020-01-17 ENCOUNTER — Inpatient Hospital Stay (HOSPITAL_COMMUNITY): Payer: PPO

## 2020-01-17 ENCOUNTER — Encounter (HOSPITAL_COMMUNITY)
Admission: EM | Disposition: A | Discharge status: Home / Self Care | Payer: Self-pay | Source: Home / Self Care | Attending: Cardiovascular Disease

## 2020-01-17 DIAGNOSIS — Z23 Encounter for immunization: Secondary | ICD-10-CM | POA: Diagnosis present

## 2020-01-17 DIAGNOSIS — I2510 Atherosclerotic heart disease of native coronary artery without angina pectoris: Secondary | ICD-10-CM

## 2020-01-17 DIAGNOSIS — E785 Hyperlipidemia, unspecified: Secondary | ICD-10-CM | POA: Diagnosis present

## 2020-01-17 DIAGNOSIS — E119 Type 2 diabetes mellitus without complications: Secondary | ICD-10-CM | POA: Diagnosis not present

## 2020-01-17 DIAGNOSIS — F17200 Nicotine dependence, unspecified, uncomplicated: Secondary | ICD-10-CM | POA: Diagnosis not present

## 2020-01-17 DIAGNOSIS — I2582 Chronic total occlusion of coronary artery: Secondary | ICD-10-CM | POA: Diagnosis not present

## 2020-01-17 DIAGNOSIS — E1165 Type 2 diabetes mellitus with hyperglycemia: Secondary | ICD-10-CM | POA: Diagnosis not present

## 2020-01-17 DIAGNOSIS — Z79899 Other long term (current) drug therapy: Secondary | ICD-10-CM | POA: Diagnosis not present

## 2020-01-17 DIAGNOSIS — I10 Essential (primary) hypertension: Secondary | ICD-10-CM | POA: Diagnosis present

## 2020-01-17 DIAGNOSIS — Z7982 Long term (current) use of aspirin: Secondary | ICD-10-CM | POA: Diagnosis not present

## 2020-01-17 DIAGNOSIS — Z20822 Contact with and (suspected) exposure to covid-19: Secondary | ICD-10-CM | POA: Diagnosis not present

## 2020-01-17 DIAGNOSIS — I77810 Thoracic aortic ectasia: Secondary | ICD-10-CM | POA: Diagnosis not present

## 2020-01-17 DIAGNOSIS — Z83438 Family history of other disorder of lipoprotein metabolism and other lipidemia: Secondary | ICD-10-CM | POA: Diagnosis not present

## 2020-01-17 DIAGNOSIS — I214 Non-ST elevation (NSTEMI) myocardial infarction: Secondary | ICD-10-CM | POA: Diagnosis present

## 2020-01-17 DIAGNOSIS — Z8249 Family history of ischemic heart disease and other diseases of the circulatory system: Secondary | ICD-10-CM | POA: Diagnosis not present

## 2020-01-17 DIAGNOSIS — I472 Ventricular tachycardia: Secondary | ICD-10-CM | POA: Diagnosis not present

## 2020-01-17 DIAGNOSIS — Q245 Malformation of coronary vessels: Secondary | ICD-10-CM | POA: Diagnosis not present

## 2020-01-17 DIAGNOSIS — Z833 Family history of diabetes mellitus: Secondary | ICD-10-CM | POA: Diagnosis not present

## 2020-01-17 DIAGNOSIS — Z7984 Long term (current) use of oral hypoglycemic drugs: Secondary | ICD-10-CM | POA: Diagnosis not present

## 2020-01-17 DIAGNOSIS — Z885 Allergy status to narcotic agent status: Secondary | ICD-10-CM | POA: Diagnosis not present

## 2020-01-17 DIAGNOSIS — Z7901 Long term (current) use of anticoagulants: Secondary | ICD-10-CM | POA: Diagnosis not present

## 2020-01-17 HISTORY — PX: CORONARY STENT INTERVENTION: CATH118234

## 2020-01-17 HISTORY — PX: LEFT HEART CATH AND CORONARY ANGIOGRAPHY: CATH118249

## 2020-01-17 LAB — D-DIMER, QUANTITATIVE: D-Dimer, Quant: 0.54 ug/mL-FEU — ABNORMAL HIGH (ref 0.00–0.50)

## 2020-01-17 LAB — HEMOGLOBIN A1C
Hgb A1c MFr Bld: 7.3 % — ABNORMAL HIGH (ref 4.8–5.6)
Mean Plasma Glucose: 162.81 mg/dL

## 2020-01-17 LAB — ECHOCARDIOGRAM COMPLETE
Area-P 1/2: 3.74 cm2
Calc EF: 47.9 %
Height: 65 in
S' Lateral: 3.5 cm
Single Plane A2C EF: 45.7 %
Single Plane A4C EF: 56.6 %
Weight: 2384 oz

## 2020-01-17 LAB — LIPID PANEL
Cholesterol: 155 mg/dL (ref 0–200)
HDL: 37 mg/dL — ABNORMAL LOW (ref >40)
LDL Cholesterol: 81 mg/dL (ref 0–99)
Total CHOL/HDL Ratio: 4.2 RATIO
Triglycerides: 187 mg/dL — ABNORMAL HIGH (ref <150)
VLDL: 37 mg/dL (ref 0–40)

## 2020-01-17 LAB — HEPARIN LEVEL (UNFRACTIONATED): Heparin Unfractionated: 0.22 IU/mL — ABNORMAL LOW (ref 0.30–0.70)

## 2020-01-17 LAB — MAGNESIUM: Magnesium: 2 mg/dL (ref 1.7–2.4)

## 2020-01-17 LAB — BASIC METABOLIC PANEL
Anion gap: 14 (ref 5–15)
BUN: 10 mg/dL (ref 8–23)
CO2: 24 mmol/L (ref 22–32)
Calcium: 9.7 mg/dL (ref 8.9–10.3)
Chloride: 99 mmol/L (ref 98–111)
Creatinine, Ser: 0.55 mg/dL (ref 0.44–1.00)
GFR, Estimated: >60 mL/min (ref >60)
Glucose, Bld: 172 mg/dL — ABNORMAL HIGH (ref 70–99)
Potassium: 3.8 mmol/L (ref 3.5–5.1)
Sodium: 137 mmol/L (ref 135–145)

## 2020-01-17 LAB — POCT ACTIVATED CLOTTING TIME
Activated Clotting Time: 301 seconds
Activated Clotting Time: 142 seconds

## 2020-01-17 LAB — GLUCOSE, CAPILLARY
Glucose-Capillary: 170 mg/dL — ABNORMAL HIGH (ref 70–99)
Glucose-Capillary: 145 mg/dL — ABNORMAL HIGH (ref 70–99)

## 2020-01-17 LAB — RESPIRATORY PANEL BY RT PCR (FLU A&B, COVID)
Influenza A by PCR: NEGATIVE
Influenza B by PCR: NEGATIVE
SARS Coronavirus 2 by RT PCR: NEGATIVE

## 2020-01-17 LAB — TROPONIN I (HIGH SENSITIVITY)
Troponin I (High Sensitivity): 85 ng/L — ABNORMAL HIGH (ref <18)
Troponin I (High Sensitivity): 860 ng/L (ref <18)

## 2020-01-17 LAB — MRSA PCR SCREENING: MRSA by PCR: NEGATIVE

## 2020-01-17 SURGERY — LEFT HEART CATH AND CORONARY ANGIOGRAPHY
Anesthesia: LOCAL

## 2020-01-17 MED ORDER — SODIUM CHLORIDE 0.9 % IV SOLN
INTRAVENOUS | Status: DC | PRN
  Administered 2020-01-17: 1.75 mg/kg/h via INTRAVENOUS

## 2020-01-17 MED ORDER — TIROFIBAN HCL IN NACL 5-0.9 MG/100ML-% IV SOLN
INTRAVENOUS | Status: AC
  Filled 2020-01-17: qty 100

## 2020-01-17 MED ORDER — MIDAZOLAM HCL 2 MG/2ML IJ SOLN
INTRAMUSCULAR | Status: AC
  Filled 2020-01-17: qty 2

## 2020-01-17 MED ORDER — SODIUM CHLORIDE 0.9 % IV SOLN: 250.0000 mL | INTRAVENOUS | Status: DC | PRN

## 2020-01-17 MED ORDER — FENTANYL CITRATE (PF) 100 MCG/2ML IJ SOLN
INTRAMUSCULAR | Status: DC | PRN
  Administered 2020-01-17: 25 ug via INTRAVENOUS

## 2020-01-17 MED ORDER — HEPARIN (PORCINE) IN NACL 1000-0.9 UT/500ML-% IV SOLN
INTRAVENOUS | Status: DC | PRN
  Administered 2020-01-17 (×3): 500 mL

## 2020-01-17 MED ORDER — SODIUM CHLORIDE 0.9% FLUSH: 3.0000 mL | INTRAVENOUS | Status: DC | PRN

## 2020-01-17 MED ORDER — TICAGRELOR 90 MG PO TABS
ORAL_TABLET | ORAL | Status: DC | PRN
  Administered 2020-01-17: 180 mg via ORAL

## 2020-01-17 MED ORDER — SODIUM CHLORIDE 0.9 % IV SOLN: INTRAVENOUS | Status: DC

## 2020-01-17 MED ORDER — ONDANSETRON HCL 4 MG/2ML IJ SOLN
4.0000 mg | Freq: Four times a day (QID) | INTRAMUSCULAR | Status: DC | PRN
  Administered 2020-01-17 (×3): 4 mg via INTRAVENOUS
  Filled 2020-01-17 (×3): qty 2

## 2020-01-17 MED ORDER — BIVALIRUDIN BOLUS VIA INFUSION - CUPID
INTRAVENOUS | Status: DC | PRN
  Administered 2020-01-17: 50.7 mg via INTRAVENOUS

## 2020-01-17 MED ORDER — MIDAZOLAM HCL 2 MG/2ML IJ SOLN
INTRAMUSCULAR | Status: DC | PRN
  Administered 2020-01-17: 1 mg via INTRAVENOUS

## 2020-01-17 MED ORDER — SODIUM CHLORIDE 0.9 % WEIGHT BASED INFUSION
3.0000 mL/kg/h | INTRAVENOUS | Status: DC
  Administered 2020-01-17: 3 mL/kg/h via INTRAVENOUS

## 2020-01-17 MED ORDER — NITROGLYCERIN 1 MG/10 ML FOR IR/CATH LAB
INTRA_ARTERIAL | Status: AC
  Filled 2020-01-17: qty 10

## 2020-01-17 MED ORDER — SODIUM CHLORIDE 0.9% FLUSH
3.0000 mL | Freq: Two times a day (BID) | INTRAVENOUS | Status: DC
  Administered 2020-01-17 – 2020-01-19 (×3): 3 mL via INTRAVENOUS

## 2020-01-17 MED ORDER — ASPIRIN EC 81 MG PO TBEC
81.0000 mg | DELAYED_RELEASE_TABLET | Freq: Every day | ORAL | Status: DC
  Administered 2020-01-18 – 2020-01-19 (×2): 81 mg via ORAL
  Filled 2020-01-17 (×2): qty 1

## 2020-01-17 MED ORDER — PERFLUTREN LIPID MICROSPHERE
1.0000 mL | INTRAVENOUS | Status: AC | PRN
  Administered 2020-01-17: 2 mL via INTRAVENOUS
  Filled 2020-01-17: qty 10

## 2020-01-17 MED ORDER — INSULIN ASPART 100 UNIT/ML ~~LOC~~ SOLN: 0.0000 [IU] | Freq: Every day | SUBCUTANEOUS | Status: DC

## 2020-01-17 MED ORDER — FENTANYL CITRATE (PF) 100 MCG/2ML IJ SOLN
INTRAMUSCULAR | Status: AC
  Filled 2020-01-17: qty 2

## 2020-01-17 MED ORDER — GABAPENTIN 300 MG PO CAPS
300.0000 mg | ORAL_CAPSULE | Freq: Every evening | ORAL | Status: DC | PRN
  Administered 2020-01-17: 300 mg via ORAL
  Filled 2020-01-17: qty 1

## 2020-01-17 MED ORDER — HYDRALAZINE HCL 20 MG/ML IJ SOLN: 10.0000 mg | INTRAMUSCULAR | Status: AC | PRN

## 2020-01-17 MED ORDER — OXYCODONE-ACETAMINOPHEN 5-325 MG PO TABS
1.0000 | ORAL_TABLET | ORAL | Status: DC | PRN
  Administered 2020-01-17 (×2): 1 via ORAL
  Filled 2020-01-17 (×2): qty 1

## 2020-01-17 MED ORDER — LIDOCAINE HCL (PF) 1 % IJ SOLN
INTRAMUSCULAR | Status: DC | PRN
  Administered 2020-01-17: 10 mL

## 2020-01-17 MED ORDER — LABETALOL HCL 5 MG/ML IV SOLN: 10.0000 mg | INTRAVENOUS | Status: AC | PRN

## 2020-01-17 MED ORDER — HEPARIN (PORCINE) IN NACL 1000-0.9 UT/500ML-% IV SOLN
INTRAVENOUS | Status: AC
  Filled 2020-01-17: qty 1000

## 2020-01-17 MED ORDER — MORPHINE SULFATE (PF) 4 MG/ML IV SOLN
4.0000 mg | Freq: Once | INTRAVENOUS | Status: AC
  Administered 2020-01-17: 4 mg via INTRAVENOUS
  Filled 2020-01-17: qty 1

## 2020-01-17 MED ORDER — TIROFIBAN HCL IN NACL 5-0.9 MG/100ML-% IV SOLN
INTRAVENOUS | Status: DC | PRN
  Administered 2020-01-17: 0.15 ug/kg/min via INTRAVENOUS

## 2020-01-17 MED ORDER — HEPARIN (PORCINE) IN NACL 1000-0.9 UT/500ML-% IV SOLN
INTRAVENOUS | Status: AC
  Filled 2020-01-17: qty 500

## 2020-01-17 MED ORDER — TICAGRELOR 90 MG PO TABS
90.0000 mg | ORAL_TABLET | Freq: Two times a day (BID) | ORAL | Status: DC
  Administered 2020-01-17 – 2020-01-19 (×4): 90 mg via ORAL
  Filled 2020-01-17 (×4): qty 1

## 2020-01-17 MED ORDER — TIROFIBAN (AGGRASTAT) BOLUS VIA INFUSION
INTRAVENOUS | Status: DC | PRN
  Administered 2020-01-17: 1690 ug via INTRAVENOUS

## 2020-01-17 MED ORDER — ATORVASTATIN CALCIUM 80 MG PO TABS
80.0000 mg | ORAL_TABLET | Freq: Every day | ORAL | Status: DC
  Administered 2020-01-17 – 2020-01-19 (×3): 80 mg via ORAL
  Filled 2020-01-17: qty 1
  Filled 2020-01-17: qty 2
  Filled 2020-01-17: qty 1

## 2020-01-17 MED ORDER — ASPIRIN 81 MG PO CHEW
81.0000 mg | CHEWABLE_TABLET | ORAL | Status: AC
  Administered 2020-01-17: 81 mg via ORAL
  Filled 2020-01-17: qty 1

## 2020-01-17 MED ORDER — SODIUM CHLORIDE 0.9% FLUSH: 3.0000 mL | Freq: Two times a day (BID) | INTRAVENOUS | Status: DC

## 2020-01-17 MED ORDER — NITROGLYCERIN 1 MG/10 ML FOR IR/CATH LAB
INTRA_ARTERIAL | Status: DC | PRN
  Administered 2020-01-17: 100 ug via INTRACORONARY

## 2020-01-17 MED ORDER — POTASSIUM CHLORIDE CRYS ER 20 MEQ PO TBCR
20.0000 meq | EXTENDED_RELEASE_TABLET | Freq: Once | ORAL | Status: AC
  Administered 2020-01-17: 20 meq via ORAL
  Filled 2020-01-17: qty 1

## 2020-01-17 MED ORDER — ENOXAPARIN SODIUM 40 MG/0.4ML ~~LOC~~ SOLN
40.0000 mg | SUBCUTANEOUS | Status: DC
  Administered 2020-01-19: 40 mg via SUBCUTANEOUS
  Filled 2020-01-17: qty 0.4

## 2020-01-17 MED ORDER — SODIUM CHLORIDE 0.9% FLUSH
3.0000 mL | Freq: Two times a day (BID) | INTRAVENOUS | Status: DC
  Administered 2020-01-17: 3 mL via INTRAVENOUS

## 2020-01-17 MED ORDER — TICAGRELOR 90 MG PO TABS
ORAL_TABLET | ORAL | Status: AC
  Filled 2020-01-17: qty 1

## 2020-01-17 MED ORDER — HEPARIN (PORCINE) 25000 UT/250ML-% IV SOLN
1100.0000 [IU]/h | INTRAVENOUS | Status: DC
  Administered 2020-01-17: 950 [IU]/h via INTRAVENOUS
  Filled 2020-01-17: qty 250

## 2020-01-17 MED ORDER — IOHEXOL 350 MG/ML SOLN
INTRAVENOUS | Status: DC | PRN
  Administered 2020-01-17: 145 mL

## 2020-01-17 MED ORDER — LIDOCAINE HCL (PF) 1 % IJ SOLN
INTRAMUSCULAR | Status: AC
  Filled 2020-01-17: qty 30

## 2020-01-17 MED ORDER — INSULIN ASPART 100 UNIT/ML ~~LOC~~ SOLN
0.0000 [IU] | Freq: Three times a day (TID) | SUBCUTANEOUS | Status: DC
  Administered 2020-01-18: 2 [IU] via SUBCUTANEOUS
  Administered 2020-01-18: 3 [IU] via SUBCUTANEOUS
  Administered 2020-01-19 (×2): 2 [IU] via SUBCUTANEOUS

## 2020-01-17 MED ORDER — BIVALIRUDIN TRIFLUOROACETATE 250 MG IV SOLR
INTRAVENOUS | Status: AC
  Filled 2020-01-17: qty 250

## 2020-01-17 MED ORDER — SODIUM CHLORIDE 0.9 % IV SOLN
INTRAVENOUS | Status: DC | PRN
  Administered 2020-01-17: 500 mL via INTRAVENOUS

## 2020-01-17 MED ORDER — SODIUM CHLORIDE 0.9 % WEIGHT BASED INFUSION: 1.0000 mL/kg/h | INTRAVENOUS | Status: DC

## 2020-01-17 MED ORDER — METOPROLOL TARTRATE 12.5 MG HALF TABLET
12.5000 mg | ORAL_TABLET | Freq: Two times a day (BID) | ORAL | Status: DC
  Administered 2020-01-17: 12.5 mg via ORAL
  Filled 2020-01-17: qty 1

## 2020-01-17 MED ORDER — NITROGLYCERIN IN D5W 200-5 MCG/ML-% IV SOLN
0.0000 ug/min | INTRAVENOUS | Status: DC
  Administered 2020-01-17: 5 ug/min via INTRAVENOUS
  Filled 2020-01-17: qty 250

## 2020-01-17 SURGICAL SUPPLY — 18 items
BALLN EMERGE MR 2.5X12 (BALLOONS) ×2
BALLN SAPPHIRE ~~LOC~~ 3.25X15 (BALLOONS) ×1 IMPLANT
BALLOON EMERGE MR 2.5X12 (BALLOONS) IMPLANT
CATH EXTRAC PRONTO 5.5F 138CM (CATHETERS) ×1 IMPLANT
CATH INFINITI 5FR MULTPACK ANG (CATHETERS) ×1 IMPLANT
CATH LAUNCHER 6FR EBU 3.75 (CATHETERS) ×1 IMPLANT
KIT ENCORE 26 ADVANTAGE (KITS) ×1 IMPLANT
KIT HEART LEFT (KITS) ×2 IMPLANT
KIT MICROPUNCTURE NIT STIFF (SHEATH) ×1 IMPLANT
PACK CARDIAC CATHETERIZATION (CUSTOM PROCEDURE TRAY) ×2 IMPLANT
SHEATH PINNACLE 5F 10CM (SHEATH) ×1 IMPLANT
SHEATH PINNACLE 6F 10CM (SHEATH) ×1 IMPLANT
STENT RESOLUTE ONYX 3.0X18 (Permanent Stent) ×1 IMPLANT
TRANSDUCER W/STOPCOCK (MISCELLANEOUS) ×2 IMPLANT
TUBING CIL FLEX 10 FLL-RA (TUBING) ×2 IMPLANT
WIRE EMERALD ST .035X150CM (WIRE) ×1 IMPLANT
WIRE PT2 MS 185 (WIRE) ×1 IMPLANT
WIRE RUNTHROUGH .014X180CM (WIRE) ×1 IMPLANT

## 2020-01-17 NOTE — Interval H&P Note (Signed)
History and Physical Interval Note:  01/17/2020 1:53 PM  Christina Larsen  has presented today for surgery, with the diagnosis of NSTEMI.  The various methods of treatment have been discussed with the patient and family. After consideration of risks, benefits and other options for treatment, the patient has consented to  Procedure(s): LEFT HEART CATH AND CORONARY ANGIOGRAPHY (N/A) as a surgical intervention.  The patient's history has been reviewed, patient examined, no change in status, stable for surgery.  I have reviewed the patient's chart and labs.  Questions were answered to the patient's satisfaction.    Cath Lab Visit (complete for each Cath Lab visit)  Clinical Evaluation Leading to the Procedure:   ACS: Yes.    Non-ACS:  N/A  [name]

## 2020-01-17 NOTE — ED Triage Notes (Signed)
Pt presents to ED BIB [facility] from [facility]. Pt initial c/c CP. Pt reports CP 2/10. Nitro drip, heparin, and IVF initiated by [facility]

## 2020-01-17 NOTE — Progress Notes (Signed)
ACT 142sec, 9F sheath removed from RFA at 1740.  Manual pressure applied to right groin for 20 min.  No bleeding or hematoma.  Rt DP and PT pulses palpated before and after sheath pull.  Tegaderm dressing applied to site.  Post instructions given to patient.  Patient understands.  bp 97/61, hr 65

## 2020-01-17 NOTE — Brief Op Note (Addendum)
BRIEF CARDIAC CATHETERIZATION NOTE  01/17/2020  3:39 PM  PATIENT:  [name]  64 y.o. female  PRE-OPERATIVE DIAGNOSIS:  NSTEMI  POST-OPERATIVE DIAGNOSIS:  NSTEMI  PROCEDURE:  Procedure(s): LEFT HEART CATH AND CORONARY ANGIOGRAPHY (N/A) CORONARY STENT INTERVENTION (N/A)  SURGEON:  Surgeon(s) and Role:    * Malaisha Silliman, Cristal Deer, MD - Primary  FINDINGS: 1. 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 2. Mildly elevated LVED with lateral wall akinesis. 3. Successful PCI to proximal LCx using Resolute Onyx 3.0 x 18 mm drug-eluting stent with 0% residual stenosis and TIMI-3 flow.  RECOMMENDATIONS: 1. DAPT with aspirin and ticagrelor for at least 12 months. 2. Aggressive secondary prevention. 3. Medical therapy for moderate ramus and distal LCx disease, as well as CTO of RCA. 4. Remove right femoral artery sheath in 2 hours.  Yvonne Kendall, MD [facility]

## 2020-01-17 NOTE — Progress Notes (Signed)
ANTICOAGULATION CONSULT NOTE - Initial Consult  Pharmacy Consult for Heparin Indication: chest pain/ACS  Allergies  Allergen Reactions  . Hydrocodone-Acetaminophen     Pt gets sick    Patient Measurements: Height: 5\' 5"  (165.1 cm) Weight: 67.6 kg (149 lb) IBW/kg (Calculated) : 57  Vital Signs: Temp: 98.3 F (36.8 C) (11/08 0114) Temp Source: Oral (11/08 0114) BP: 96/68 (11/08 0245) Pulse Rate: 70 (11/08 0245)  Labs: Recent Labs    01/16/20 2345  HGB 15.6*  HCT 45.6  PLT 257  CREATININE 0.55  TROPONINIHS 85*    Estimated Creatinine Clearance: 62.2 mL/min (by C-G formula based on SCr of 0.55 mg/dL).   Medical History: Past Medical History:  Diagnosis Date  . Diabetes mellitus without complication (HCC)   . HLD (hyperlipidemia)     Medications:  No current facility-administered medications on file prior to encounter.   Current Outpatient Medications on File Prior to Encounter  Medication Sig Dispense Refill  . atorvastatin (LIPITOR) 20 MG tablet Take 20 mg by mouth daily.     13/07/21 gabapentin (NEURONTIN) 300 MG capsule Take 1 capsule (300 mg total) by mouth at bedtime. (Patient taking differently: Take 300 mg by mouth at bedtime as needed (pain). ) 30 capsule 0  . metFORMIN (GLUCOPHAGE) 1000 MG tablet Take 1,000 mg by mouth every evening.     Marland Kitchen oxyCODONE-acetaminophen (PERCOCET/ROXICET) 5-325 MG tablet Take 1-2 tablets by mouth every 4 (four) hours as needed for severe pain.     . TRULICITY 1.5 MG/0.5ML SOPN Inject 1.5 mg into the muscle every Sunday.     . [DISCONTINUED] pyridostigmine (MESTINON) 60 MG tablet 1/2 tablet 3 times daily for 2 weeks, then take 1 full tablet 3 times daily. 90 tablet 1     Assessment: 64 y.o. female with chest pain for heparin.  Heparin 950 units/hr started at [facility] at 0100  Goal of Therapy:  Heparin level 0.3-0.7 units/ml Monitor platelets by anticoagulation protocol: Yes   Plan:  Continue Heparin at current rate  Check heparin  level in 8 hours.   [facility], PharmD, BCPS   Johnice Riebe, Geannie Risen 01/17/2020,3:36 AM

## 2020-01-17 NOTE — H&P (View-Only) (Signed)
 Progress Note  Patient Name: Christina Larsen Date of Encounter: 01/17/2020  [facility] Cardiologist: No primary care provider on file.   Subjective   Still with lingering chest heaviness.   Inpatient Medications    Scheduled Meds: . [START ON 01/18/2020] aspirin EC  81 mg Oral Daily  . atorvastatin  80 mg Oral Daily  . metoprolol tartrate  12.5 mg Oral BID   Continuous Infusions: . sodium chloride 500 mL (01/17/20 0032)  . heparin 950 Units/hr (01/17/20 0055)  . nitroGLYCERIN 25 mcg/min (01/17/20 0525)   PRN Meds: sodium chloride, gabapentin, ondansetron (ZOFRAN) IV, oxyCODONE-acetaminophen   Vital Signs    Vitals:   01/17/20 0500 01/17/20 0530 01/17/20 0545 01/17/20 0629  BP: 93/66 104/70 91/62 102/72  Pulse: (!) 53 (!) 57 (!) 57 61  Resp: (!) 21 (!) 23 19 19  Temp:   98.3 F (36.8 C) 98 F (36.7 C)  TempSrc:    Oral  SpO2: 93% 93% 94% 94%  Weight:      Height:       No intake or output data in the 24 hours ending 01/17/20 0810 Last 3 Weights 01/16/2020 05/07/2018  Weight (lbs) 149 lb 153 lb  Weight (kg) 67.586 kg 69.4 kg      Telemetry    SR with short runs of NSVT 3,4 beats - Personally Reviewed  ECG    SB with nonspecific ST changes with PVC - Personally Reviewed  Physical Exam  Pleasant female, sitting up in bed.  GEN: No acute distress.   Neck: No JVD Cardiac: RRR, no murmurs, rubs, or gallops.  Respiratory: Clear to auscultation bilaterally. GI: Soft, nontender, non-distended  MS: No edema; No deformity. Right arm in sling s/p rotator cuff surgery Neuro:  Nonfocal  Psych: Normal affect   Labs    High Sensitivity Troponin:   Recent Labs  Lab 01/16/20 2345 01/17/20 0235  TROPONINIHS 85* 860*      Chemistry Recent Labs  Lab 01/16/20 2345  NA 137  K 3.8  CL 99  CO2 24  GLUCOSE 172*  BUN 10  CREATININE 0.55  CALCIUM 9.7  GFRNONAA >60  ANIONGAP 14     Hematology Recent Labs  Lab 01/16/20 2345  WBC 13.2*  RBC 4.96    HGB 15.6*  HCT 45.6  MCV 91.9  MCH 31.5  MCHC 34.2  RDW 13.7  PLT 257    BNPNo results for input(s): BNP, PROBNP in the last 168 hours.   DDimer  Recent Labs  Lab 01/16/20 2345  DDIMER 0.54*    Lipid Panel  No results found for: CHOL, TRIG, HDL, CHOLHDL, VLDL, LDLCALC, LDLDIRECT, LABVLDL   Radiology    DG Chest Port 1 View  Result Date: 01/16/2020 CLINICAL DATA:  Substernal chest pain EXAM: PORTABLE CHEST 1 VIEW COMPARISON:  None. FINDINGS: The heart size and mediastinal contours are within normal limits. Aortic knob calcifications are seen. Subsegmental atelectasis seen at both lung bases. The visualized skeletal structures are unremarkable. IMPRESSION: No active disease. Electronically Signed   By: Bindu  Avutu M.D.   On: 01/16/2020 23:47    Cardiac Studies   N/a   Patient Profile     64 y.o. female with PMH of HLD, DM and tobacco use who presented with chest pain/pressure and found to have elevated troponin.   Assessment & Plan    1. NSTEMI: hsTn up to 860 this morning. EKG on admission showed ST depression in anterior leads, which are improved   on EKG this morning. Remains on IV heparin and nitro. Still with mild lingering chest heaviness 3/10. Will plan for cardiac cath today.  --The patient understands that risks included but are not limited to stroke (1 in 1000), death (1 in 1000), kidney failure [usually temporary] (1 in 500), bleeding (1 in 200), allergic reaction [possibly serious] (1 in 200).  -- continue ASA, statin, heparin and nitro. No room titrate BB with bradycardia -- echo pending  2. DM2: on metformin and trulicity PTA.  -- Hgb A1c pending -- add SSI -- consider SGLT2 pending cath  3. HLD: on high dose statin -- FLP pending  4. S/p right rotator cuff surgery: initial surgery back in June, reports retearing while in PT and redo surgery about a week ago. Has follow up in the office planned in a week  5. NSVT: some short 3-4 beat runs noted on  telemetry.  -- continue BB as above -- suppl K+ -- check mag  For questions or updates, please contact [facility] Please consult [email or web address] for contact info under        Signed, Lindsay Roberts, NP  01/17/2020, 8:10 AM    Patient seen and examined. Agree with assessment and plan. Patient developed significant chest pain last evening while watching "[place]"  Series.  Patient is currently getting a 2D echo at the bedside. Wall motion appears grossly within normal limits. Review of ECG from last evening demonstrates early inferolateral J-point elevation with precordial ST depression. The patient has been on IV heparin and IV nitroglycerin. ECG today shows normalization of prior ECG changes. Telemetry now shows sinus bradycardia at 55 bpm. She continues to have mild residual chest pressure. High-sensitivity troponin 85 > 860. Suspect patient has high-grade stenosis with flow improved this morning compared to last evening. Plan cardiac catheterization this a.m. with probable PCI. With right arm in postoperative stabilization, will need to do cath from left radial or femoral approach.  Patient is aware of the risk benefits of the procedure. Discussed the importance of complete smoking cessation.  Auburn Hert A. Neco Kling, MD, FACC 01/17/2020 9:15 AM   

## 2020-01-17 NOTE — ED Notes (Signed)
Christina Larsen [phone] emergency contact

## 2020-01-17 NOTE — ED Provider Notes (Signed)
6:05 AM Assumed care from Dr. Wilkie Aye at [facility], please see their note for full history, physical and decision making until this point. In brief this is a 64 y.o. year old female who presented to the ED tonight with Chest Pain     NSTEMI, heparin and ntg started. Cardiology consulted on arrival. Concerning story, likely needs cath but not emergently unless trop continues to rise, will wait pending that result.   Cardiology to admit. Pain improved but not resolved.   CRITICAL CARE Performed by: Marily Memos Total critical care time: 32 minutes Critical care time was exclusive of separately billable procedures and treating other patients. Critical care was necessary to treat or prevent imminent or life-threatening deterioration. Critical care was time spent personally by me on the following activities: development of treatment plan with patient and/or surrogate as well as nursing, discussions with consultants, evaluation of patient's response to treatment, examination of patient, obtaining history from patient or surrogate, ordering and performing treatments and interventions, ordering and review of laboratory studies, ordering and review of radiographic studies, pulse oximetry and re-evaluation of patient's condition.   Labs, studies and imaging reviewed by myself and considered in medical decision making if ordered. Imaging interpreted by radiology.  Labs Reviewed  BASIC METABOLIC PANEL - Abnormal; Notable for the following components:      Result Value   Glucose, Bld 172 (*)    All other components within normal limits  CBC - Abnormal; Notable for the following components:   WBC 13.2 (*)    Hemoglobin 15.6 (*)    All other components within normal limits  D-DIMER, QUANTITATIVE (NOT AT [facility]) - Abnormal; Notable for the following components:   D-Dimer, Quant 0.54 (*)    All other components within normal limits  TROPONIN I (HIGH SENSITIVITY) - Abnormal; Notable for the following components:    Troponin I (High Sensitivity) 85 (*)    All other components within normal limits  TROPONIN I (HIGH SENSITIVITY) - Abnormal; Notable for the following components:   Troponin I (High Sensitivity) 860 (*)    All other components within normal limits  RESPIRATORY PANEL BY RT PCR (FLU A&B, COVID)  RESPIRATORY PANEL BY RT PCR (FLU A&B, COVID)  HEPARIN LEVEL (UNFRACTIONATED)  HEMOGLOBIN A1C  TROPONIN I (HIGH SENSITIVITY)    DG Chest Port 1 View  Final Result      No follow-ups on file.    Marily Memos, MD 01/17/20 [phone]

## 2020-01-17 NOTE — Plan of Care (Signed)
  Problem: Education: Goal: Knowledge of General Education information will improve Description: Including pain rating scale, medication(s)/side effects and non-pharmacologic comfort measures Outcome: Progressing   Problem: Clinical Measurements: Goal: Ability to maintain clinical measurements within normal limits will improve Outcome: Progressing   Problem: Clinical Measurements: Goal: Diagnostic test results will improve Outcome: Progressing   Problem: Clinical Measurements: Goal: Cardiovascular complication will be avoided Outcome: Progressing   Problem: Nutrition: Goal: Adequate nutrition will be maintained Outcome: Progressing   Problem: Elimination: Goal: Will not experience complications related to urinary retention Outcome: Progressing   Problem: Pain Managment: Goal: General experience of comfort will improve Outcome: Progressing   Problem: Skin Integrity: Goal: Risk for impaired skin integrity will decrease Outcome: Progressing   Problem: Education: Goal: Understanding of CV disease, CV risk reduction, and recovery process will improve Outcome: Progressing   Problem: Cardiovascular: Goal: Ability to achieve and maintain adequate cardiovascular perfusion will improve Outcome: Progressing   Problem: Cardiovascular: Goal: Vascular access site(s) Level 0-1 will be maintained Outcome: Progressing

## 2020-01-17 NOTE — H&P (Signed)
Cardiology History & Physical    Patient ID: Christina Larsen MRN: [id number], DOB/AGE: 11/05/1955   Admit date: 01/16/2020  Primary Physician: Geoffry Paradise, MD Primary Cardiologist: No primary care provider on file.  Patient Profile    Christina Larsen is a 63 y.o. woman with a history of type 2 diabetes, hyperlipidemia, smoking (current), and recent rotator cuff surgery (right arm immobilized), who presents with chest pain.  History of Present Illness    Earlier this evening, beginning at approximately 9:30 this evening, she developed sudden onset chest pressure while at rest that was associated with nausea, vomiting, and cold sweats. The pain has had a somewhat waxing and waning course but has not completely subsided since onset. It is not pleuritic or positional. Prior to this episode, she has never had anything similar. Prior to her recent rotator cuff surgery, she was walking her dog regularly without any anginal symptoms or exertional intolerance. She currently denies any recent dyspnea. No orthopnea or leg swelling. No dizziness or syncope.   She was taken initially to [facility] ED by her neighbor. Serial ECGs showed anteroseptal ST depression and some nonspecific ST changes in the anterolateral and possibly the inferior leads (interpretation limited by baseline sway). Troponin was elevated to 85. She was given aspirin and started on heparin infusion with bolus. Pain began improving with nitroglycerin infusion. She was transferred here for further management. On arrival to [facility] ED, still having 2/10 chest pain, but improving. ECG repeated on arrival and ST changes are nearly resolved. Repeat troponin pending. She is hemodynamically stable, though with low-normal BP on current nitro infusion rate at 15 mcg/min.   Past Medical History   Past Medical History:  Diagnosis Date  . Diabetes mellitus without complication (HCC)   . HLD (hyperlipidemia)     Past Surgical History:  Procedure  Laterality Date  . CHOLECYSTECTOMY  1992  . LAMINECTOMY  2006  . rotator cuff surgery       Allergies Allergies  Allergen Reactions  . Hydrocodone-Acetaminophen     Pt gets sick    Home Medications    Prior to Admission medications   Medication Sig Start Date End Date Taking? Authorizing Provider  atorvastatin (LIPITOR) 20 MG tablet Take 20 mg by mouth daily.  04/18/18  Yes [provider]  gabapentin (NEURONTIN) 300 MG capsule Take 1 capsule (300 mg total) by mouth at bedtime. Patient taking differently: Take 300 mg by mouth at bedtime as needed (pain).  03/19/19  Yes Park Liter, DPM  metFORMIN (GLUCOPHAGE) 1000 MG tablet Take 1,000 mg by mouth every evening.  03/27/18  Yes [provider]  oxyCODONE-acetaminophen (PERCOCET/ROXICET) 5-325 MG tablet Take 1-2 tablets by mouth every 4 (four) hours as needed for severe pain.  01/11/20  Yes [provider]  TRULICITY 1.5 MG/0.5ML SOPN Inject 1.5 mg into the muscle every Sunday.  03/28/18  Yes [provider]    Family History    Family History  Problem Relation Age of Onset  . Diabetes Mother   . Colon cancer Mother   . Prostate cancer Father   . Hypercholesterolemia Father   . Hypertension Brother    She indicated that her mother is deceased. She indicated that her father is deceased. She indicated that her sister is alive. She indicated that her brother is alive.   Social History    Social History   Socioeconomic History  . Marital status: Widowed    Spouse name: Not on file  .  Number of children: Not on file  . Years of education: Not on file  . Highest education level: Not on file  Occupational History  . Not on file  Tobacco Use  . Smoking status: Current Some Day Smoker    Last attempt to quit: 2015    Years since quitting: 6.8  . Smokeless tobacco: Never Used  Substance and Sexual Activity  . Alcohol use: Yes    Alcohol/week: 4.0 standard drinks    Types: 4 Glasses of  wine per week  . Drug use: Not Currently  . Sexual activity: Not on file  Other Topics Concern  . Not on file  Social History Narrative  . Not on file   Social Determinants of Health   Financial Resource Strain:   . Difficulty of Paying Living Expenses: Not on file  Food Insecurity:   . Worried About Programme researcher, broadcasting/film/video in the Last Year: Not on file  . Ran Out of Food in the Last Year: Not on file  Transportation Needs:   . Lack of Transportation (Medical): Not on file  . Lack of Transportation (Non-Medical): Not on file  Physical Activity:   . Days of Exercise per Week: Not on file  . Minutes of Exercise per Session: Not on file  Stress:   . Feeling of Stress : Not on file  Social Connections:   . Frequency of Communication with Friends and Family: Not on file  . Frequency of Social Gatherings with Friends and Family: Not on file  . Attends Religious Services: Not on file  . Active Member of Clubs or Organizations: Not on file  . Attends Banker Meetings: Not on file  . Marital Status: Not on file  Intimate Partner Violence:   . Fear of Current or Ex-Partner: Not on file  . Emotionally Abused: Not on file  . Physically Abused: Not on file  . Sexually Abused: Not on file     Review of Systems   A comprehensive review of systems was performed with pertinent positives and negatives noted in the HPI.   Physical Exam    BP 96/68   Pulse 70   Temp 98.3 F (36.8 C) (Oral)   Resp (!) 24   Ht 5\' 5"  (1.651 m)   Wt 67.6 kg   SpO2 95%   BMI 24.79 kg/m  General: Alert, NAD HEENT: Normal  Neck: No bruits or JVD. Lungs:  Resp regular and unlabored, CTA bilaterally. Heart: Regular rhythm, no s3, s4, or murmurs. Abdomen: Soft, non-tender, non-distended, BS +.  Extremities: Right arm immobilized in splint. Warm. No clubbing, cyanosis or edema. Radial and femoral pulses 2+ and equal bilaterally. Psych: Normal affect. Neuro: Alert and oriented. No gross focal  deficits. No abnormal movements.  Labs    Cardiac Panel (last 3 results) Recent Labs    01/16/20 2345 01/17/20 0235  TROPONINIHS 85* pending   Lab Results  Component Value Date   WBC 13.2 (H) 01/16/2020   HGB 15.6 (H) 01/16/2020   HCT 45.6 01/16/2020   MCV 91.9 01/16/2020   PLT 257 01/16/2020    Recent Labs  Lab 01/16/20 2345  NA 137  K 3.8  CL 99  CO2 24  BUN 10  CREATININE 0.55  CALCIUM 9.7  GLUCOSE 172*   No results found for: CHOL, HDL, LDLCALC, TRIG Lab Results  Component Value Date   DDIMER 0.54 (H) 01/16/2020     Radiology Studies    DG Chest  Port 1 View  Result Date: 01/16/2020 CLINICAL DATA:  Substernal chest pain EXAM: PORTABLE CHEST 1 VIEW COMPARISON:  None. FINDINGS: The heart size and mediastinal contours are within normal limits. Aortic knob calcifications are seen. Subsegmental atelectasis seen at both lung bases. The visualized skeletal structures are unremarkable. IMPRESSION: No active disease. Electronically Signed   By: Jonna Clark M.D.   On: 01/16/2020 23:47    ECG & Cardiac Imaging   All ECGs personally reviewed:   ECG 01/16/20 23:01 - NSR, anteroseptal ST depression, baseline wander and artifact in inferior and lateral leads. ECG 01/16/20 23:14 - NSR, anteroseptal ST depression, < 53mm ST elevation in V6 only ECG 01/17/20 00:04 - NSR, anteroseptal ST depression, < 36mm ST elevation in V6 only (improving), baseline wander in inferior leads ECG 01/17/20 02:44 - NSR, ST changes nearly resolved  Assessment & Plan    NSTEMI: Typical chest pain associated with ischemic ECG changes and mild troponin elevation. Suspect circumflex culprit based on ECG, though did not quite meet STEMI criteria. She still has some mild waxing/waning pain, but it has improved with medical therapy and ST changes have improved substantially. Second troponin is still pending. The patient's TIMI risk score is 6, which indicates a 41% risk of all cause mortality, new or  recurrent myocardial infarction or need for urgent revascularization in the next 14 days.  - Will need urgent/emergent coronary angiography this morning. Given improvement in pain and ECG changes, it would be be more ideal at this point to be able to proceed with non-emergent catheterization in a few hours. However, if next troponin suggests significant ongoing injury and she continues to have pain, we may need to proceed more emergently.   - Access: left radial vs femoral artery due to immobilized right arm  - No contraindications to DES and no pertinent allergies. - Continue heparin infusion with pharmacy consult - Aspirin 81mg  daily, received full dose PTA - Continue nitroglycerin infusion at current rate. Further titration limited by blood pressure. - Start beta blocker tomorrow if able, currently limited by heart rate and blood pressure - Increase atorvastatin to 80mg  daily - Further risk stratification with TTE, lipids, and A1c in the morning. - Smoking cessation counseling and cardiac rehab referral  Type 2 DM: currently mildly hyperglycemic. On MFM and Trulicity. - Check A1c in the morning - Hold metformin for angiography - Last dose Trulicity was yesterday - SSI insulin only for now  Hyperlipidemia: No recent lipids. Currently takes atorvastatin 20mg . Goal LDL < 70. - Increase atorvastatin as above - Lipid panel ordered.  Nutrition: NPO for cath lab DVT ppx: full-dose heparin Advanced Care Planning: Full Code   Signed, , MD 01/17/2020, 3:53 AM

## 2020-01-17 NOTE — Progress Notes (Signed)
  Echocardiogram 2D Echocardiogram has been performed.  [name] 01/17/2020, 9:27 AM

## 2020-01-17 NOTE — Progress Notes (Addendum)
Progress Note  Patient Name: Christina Larsen Date of Encounter: 01/17/2020  [facility] Cardiologist: No primary care provider on file.   Subjective   Still with lingering chest heaviness.   Inpatient Medications    Scheduled Meds: . [START ON 01/18/2020] aspirin EC  81 mg Oral Daily  . atorvastatin  80 mg Oral Daily  . metoprolol tartrate  12.5 mg Oral BID   Continuous Infusions: . sodium chloride 500 mL (01/17/20 0032)  . heparin 950 Units/hr (01/17/20 0055)  . nitroGLYCERIN 25 mcg/min (01/17/20 0525)   PRN Meds: sodium chloride, gabapentin, ondansetron (ZOFRAN) IV, oxyCODONE-acetaminophen   Vital Signs    Vitals:   01/17/20 0500 01/17/20 0530 01/17/20 0545 01/17/20 0629  BP: 93/66 104/70 91/62 102/72  Pulse: (!) 53 (!) 57 (!) 57 61  Resp: (!) 21 (!) 23 19 19   Temp:   98.3 F (36.8 C) 98 F (36.7 C)  TempSrc:    Oral  SpO2: 93% 93% 94% 94%  Weight:      Height:       No intake or output data in the 24 hours ending 01/17/20 0810 Last 3 Weights 01/16/2020 05/07/2018  Weight (lbs) 149 lb 153 lb  Weight (kg) 67.586 kg 69.4 kg      Telemetry    SR with short runs of NSVT 3,4 beats - Personally Reviewed  ECG    SB with nonspecific ST changes with PVC - Personally Reviewed  Physical Exam  Pleasant female, sitting up in bed.  GEN: No acute distress.   Neck: No JVD Cardiac: RRR, no murmurs, rubs, or gallops.  Respiratory: Clear to auscultation bilaterally. GI: Soft, nontender, non-distended  MS: No edema; No deformity. Right arm in sling s/p rotator cuff surgery Neuro:  Nonfocal  Psych: Normal affect   Labs    High Sensitivity Troponin:   Recent Labs  Lab 01/16/20 2345 01/17/20 0235  TROPONINIHS 85* 860*      Chemistry Recent Labs  Lab 01/16/20 2345  NA 137  K 3.8  CL 99  CO2 24  GLUCOSE 172*  BUN 10  CREATININE 0.55  CALCIUM 9.7  GFRNONAA >60  ANIONGAP 14     Hematology Recent Labs  Lab 01/16/20 2345  WBC 13.2*  RBC 4.96    HGB 15.6*  HCT 45.6  MCV 91.9  MCH 31.5  MCHC 34.2  RDW 13.7  PLT 257    BNPNo results for input(s): BNP, PROBNP in the last 168 hours.   DDimer  Recent Labs  Lab 01/16/20 2345  DDIMER 0.54*    Lipid Panel  No results found for: CHOL, TRIG, HDL, CHOLHDL, VLDL, LDLCALC, LDLDIRECT, LABVLDL   Radiology    DG Chest Port 1 View  Result Date: 01/16/2020 CLINICAL DATA:  Substernal chest pain EXAM: PORTABLE CHEST 1 VIEW COMPARISON:  None. FINDINGS: The heart size and mediastinal contours are within normal limits. Aortic knob calcifications are seen. Subsegmental atelectasis seen at both lung bases. The visualized skeletal structures are unremarkable. IMPRESSION: No active disease. Electronically Signed   By: 13/09/2019 M.D.   On: 01/16/2020 23:47    Cardiac Studies   N/a   Patient Profile     64 y.o. female with PMH of HLD, DM and tobacco use who presented with chest pain/pressure and found to have elevated troponin.   Assessment & Plan    1. NSTEMI: hsTn up to 860 this morning. EKG on admission showed ST depression in anterior leads, which are improved  on EKG this morning. Remains on IV heparin and nitro. Still with mild lingering chest heaviness 3/10. Will plan for cardiac cath today.  --The patient understands that risks included but are not limited to stroke (1 in 1000), death (1 in 1000), kidney failure [usually temporary] (1 in 500), bleeding (1 in 200), allergic reaction [possibly serious] (1 in 200).  -- continue ASA, statin, heparin and nitro. No room titrate BB with bradycardia -- echo pending  2. DM2: on metformin and trulicity PTA.  -- Hgb A1c pending -- add SSI -- consider SGLT2 pending cath  3. HLD: on high dose statin -- FLP pending  4. S/p right rotator cuff surgery: initial surgery back in June, reports retearing while in PT and redo surgery about a week ago. Has follow up in the office planned in a week  5. NSVT: some short 3-4 beat runs noted on  telemetry.  -- continue BB as above -- suppl K+ -- check mag  For questions or updates, please contact [facility] Please consult [email or web address] for contact info under        Signed, Laverda Page, NP  01/17/2020, 8:10 AM    Patient seen and examined. Agree with assessment and plan. Patient developed significant chest pain last evening while watching "[employer].  Patient is currently getting a 2D echo at the bedside. Wall motion appears grossly within normal limits. Review of ECG from last evening demonstrates early inferolateral J-point elevation with precordial ST depression. The patient has been on IV heparin and IV nitroglycerin. ECG today shows normalization of prior ECG changes. Telemetry now shows sinus bradycardia at 55 bpm. She continues to have mild residual chest pressure. High-sensitivity troponin 85 > 860. Suspect patient has high-grade stenosis with flow improved this morning compared to last evening. Plan cardiac catheterization this a.m. with probable PCI. With right arm in postoperative stabilization, will need to do cath from left radial or femoral approach.  Patient is aware of the risk benefits of the procedure. Discussed the importance of complete smoking cessation.  [name], MD, [facility] 01/17/2020 9:15 AM

## 2020-01-18 ENCOUNTER — Encounter (HOSPITAL_COMMUNITY): Payer: Self-pay | Admitting: Internal Medicine

## 2020-01-18 LAB — CBC
HCT: 39.1 % (ref 36.0–46.0)
Hemoglobin: 13 g/dL (ref 12.0–15.0)
MCH: 31.4 pg (ref 26.0–34.0)
MCHC: 33.2 g/dL (ref 30.0–36.0)
MCV: 94.4 fL (ref 80.0–100.0)
Platelets: 192 10*3/uL (ref 150–400)
RBC: 4.14 MIL/uL (ref 3.87–5.11)
RDW: 13.8 % (ref 11.5–15.5)
WBC: 14.5 10*3/uL — ABNORMAL HIGH (ref 4.0–10.5)
nRBC: 0 % (ref 0.0–0.2)

## 2020-01-18 LAB — BASIC METABOLIC PANEL
Anion gap: 11 (ref 5–15)
BUN: 19 mg/dL (ref 8–23)
CO2: 20 mmol/L — ABNORMAL LOW (ref 22–32)
Calcium: 9 mg/dL (ref 8.9–10.3)
Chloride: 105 mmol/L (ref 98–111)
Creatinine, Ser: 0.66 mg/dL (ref 0.44–1.00)
GFR, Estimated: >60 mL/min (ref >60)
Glucose, Bld: 185 mg/dL — ABNORMAL HIGH (ref 70–99)
Potassium: 3.8 mmol/L (ref 3.5–5.1)
Sodium: 136 mmol/L (ref 135–145)

## 2020-01-18 LAB — GLUCOSE, CAPILLARY
Glucose-Capillary: 144 mg/dL — ABNORMAL HIGH (ref 70–99)
Glucose-Capillary: 151 mg/dL — ABNORMAL HIGH (ref 70–99)
Glucose-Capillary: 233 mg/dL — ABNORMAL HIGH (ref 70–99)
Glucose-Capillary: 136 mg/dL — ABNORMAL HIGH (ref 70–99)
Glucose-Capillary: 154 mg/dL — ABNORMAL HIGH (ref 70–99)

## 2020-01-18 LAB — HIV ANTIBODY (ROUTINE TESTING W REFLEX): HIV Screen 4th Generation wRfx: NONREACTIVE

## 2020-01-18 MED ORDER — INFLUENZA VAC A&B SA ADJ QUAD 0.5 ML IM PRSY
0.5000 mL | PREFILLED_SYRINGE | INTRAMUSCULAR | Status: AC
  Administered 2020-01-19: 0.5 mL via INTRAMUSCULAR
  Filled 2020-01-18: qty 0.5

## 2020-01-18 MED ORDER — METOPROLOL TARTRATE 25 MG PO TABS
25.0000 mg | ORAL_TABLET | Freq: Two times a day (BID) | ORAL | Status: DC
  Administered 2020-01-18 – 2020-01-19 (×3): 25 mg via ORAL
  Filled 2020-01-18 (×3): qty 1

## 2020-01-18 NOTE — Discharge Instructions (Signed)

## 2020-01-18 NOTE — Progress Notes (Signed)
OT Cancellation Note  Patient Details Name: Christina Larsen MRN: [id number] DOB: Dec 04, 1955   Cancelled Treatment:    Reason Eval/Treat Not Completed: OT screened, no needs identified, will sign off; pt declines need for OT at this time. States she's been completing ADL/mobility tasks with intermittent assist from her neighbor - states has been dealing with shoulder injury for quite some time now and with no concerns performing ADL after return home. Acute OT to sign off at this time. Please re-consult should pt's needs change.  Marcy Siren, OT Acute Rehabilitation Services Pager [phone] Office [phone]   Orlando Penner 01/18/2020, 1:01 PM

## 2020-01-18 NOTE — TOC Benefit Eligibility Note (Signed)
Transition of Care [facility]) Benefit Eligibility Note    Patient Details  Name: Omaya Nieland MRN: [id number] Date of Birth: 1956-02-14   Medication/Dose: Kary Kos  90 MG BID  Covered?: Yes  Tier: 3 Drug  Prescription Coverage Preferred Pharmacy: [facility] with Person/Company/Phone Number:: [id number]  @   [employer]  DO #  [phone] OPT- 2  Co-Pay: $ 45.00  Prior Approval: No  Deductible: Met       Memory Argue Phone Number: 01/18/2020, 12:02 PM

## 2020-01-18 NOTE — Progress Notes (Signed)
CARDIAC REHAB PHASE I   PRE:  Rate/Rhythm: 69 SR    BP: sitting 97/65    SaO2: 89-91 RA  MODE:  Ambulation: 110 ft   POST:  Rate/Rhythm: 82 SR    BP: sitting 106/70     SaO2: 91-93 RA  Pt weak and lightheaded with ambulation. To BR then slow, cautious pace in hall, min assist. Short distance. SaO2 low as well as BP. To recliner. Long discussion of MI, stent, restrictions, Brilinta importance, smoking cessation, diet, exercise, NTG and CRPII. Pt very receptive. Gave her IS and practiced, 1250 mL, encouraged use and more walking today. Initially suggested OT today and MD put order in. However with further discussion pt feels she does not need this referral as she has been dealing with her immobile right arm for 2 months.  Will refer to CRPII in [place] however it will probably depend on her rotator cuff PT progress.  [phone]  [name] CES, ACSM 01/18/2020 10:45 AM

## 2020-01-18 NOTE — Progress Notes (Signed)
Progress Note  Patient Name: Christina Larsen Date of Encounter: 01/18/2020  Primary Cardiologist: new, [name], MD   Subjective   No recurrent chest pain  Inpatient Medications    Scheduled Meds: . aspirin EC  81 mg Oral Daily  . atorvastatin  80 mg Oral Daily  . enoxaparin (LOVENOX) injection  40 mg Subcutaneous Q24H  . insulin aspart  0-15 Units Subcutaneous TID WC  . insulin aspart  0-5 Units Subcutaneous QHS  . metoprolol tartrate  12.5 mg Oral BID  . sodium chloride flush  3 mL Intravenous Q12H  . ticagrelor  90 mg Oral BID   Continuous Infusions: . sodium chloride     PRN Meds: sodium chloride, gabapentin, ondansetron (ZOFRAN) IV, oxyCODONE-acetaminophen, sodium chloride flush   Vital Signs    Vitals:   01/17/20 2150 01/17/20 2340 01/18/20 0323 01/18/20 0745  BP: 104/67 92/61 105/72 94/69  Pulse:  69 71 73  Resp:  20 20 17   Temp:  98.3 F (36.8 C) 98.1 F (36.7 C) 98.2 F (36.8 C)  TempSrc:  Oral Oral Oral  SpO2:  91% 91% 91%  Weight:      Height:        Intake/Output Summary (Last 24 hours) at 01/18/2020 0831 Last data filed at 01/18/2020 0323 Gross per 24 hour  Intake 1872.61 ml  Output -  Net 1872.61 ml    I/O since admission: +1872  [facility] Weights   01/16/20 2303  Weight: 67.6 kg    Telemetry    Sinus - Personally Reviewed  ECG    ECG (independently read by me): NSR at 65; no STT changes  Physical Exam   BP 94/69   Pulse 73   Temp 98.2 F (36.8 C) (Oral)   Resp 17   Ht 5\' 5"  (1.651 m)   Wt 67.6 kg   SpO2 91%   BMI 24.79 kg/m  General: Alert, oriented, no distress.  Skin: normal turgor, no rashes, warm and dry HEENT: Normocephalic, atraumatic. Pupils equal round and reactive to light; sclera anicteric; extraocular muscles intact;  Nose without nasal septal hypertrophy Mouth/Parynx benign;  Neck: No JVD, no carotid bruits; normal carotid upstroke Lungs: clear to ausculatation and percussion; no wheezing or rales Chest  wall: without tenderness to palpitation Heart: PMI not displaced, RRR, s1 s2 normal, 1/6 systolic murmur, no diastolic murmur, no rubs, gallops, thrills, or heaves Abdomen: soft, nontender; no hepatosplenomehaly, BS+; abdominal aorta nontender and not dilated by palpation. Back: no CVA tenderness Pulses 2+ Musculoskeletal: full range of motion, normal strength, no joint deformities Extremities:R arm in postop splint; no clubbing cyanosis or edema, Homan's sign negative  Neurologic: grossly nonfocal; Cranial nerves grossly wnl Psychologic: Normal mood and affect  Labs    Chemistry Recent Labs  Lab 01/16/20 2345 01/18/20 0043  NA 137 136  K 3.8 3.8  CL 99 105  CO2 24 20*  GLUCOSE 172* 185*  BUN 10 19  CREATININE 0.55 0.66  CALCIUM 9.7 9.0  GFRNONAA >60 >60  ANIONGAP 14 11     Hematology Recent Labs  Lab 01/16/20 2345 01/18/20 0043  WBC 13.2* 14.5*  RBC 4.96 4.14  HGB 15.6* 13.0  HCT 45.6 39.1  MCV 91.9 94.4  MCH 31.5 31.4  MCHC 34.2 33.2  RDW 13.7 13.8  PLT 257 192    Cardiac EnzymesNo results for input(s): TROPONINI in the last 168 hours. No results for input(s): TROPIPOC in the last 168 hours.   BNPNo results for  input(s): BNP, PROBNP in the last 168 hours.   DDimer  Recent Labs  Lab 01/16/20 2345  DDIMER 0.54*     Lipid Panel     Component Value Date/Time   CHOL 155 01/17/2020 1206   TRIG 187 (H) 01/17/2020 1206   HDL 37 (L) 01/17/2020 1206   CHOLHDL 4.2 01/17/2020 1206   VLDL 37 01/17/2020 1206   LDLCALC 81 01/17/2020 1206     Radiology    CARDIAC CATHETERIZATION  Result Date: 01/17/2020 Conclusions: 1. Significant two-vessel coronary artery disease with occlusions of the proximal LCx (acute with heavy thrombus burden) and mid RCA (chronic with left-to-right collaterals).  Moderate, non-obstructive coronary artery disease is also present in the ramus intermedius and distal LCx (codominant vessel). 2. Mid LAD myocardial bridge. 3. Mildly  elevated left ventricular filling pressure with lateral wall hypokinesis and mildly reduced contraction. 4. Successful PCI to proximal LCx with aspiration thrombectomy and placement of Resolute Onyx 3.0 x 18 mm drug-eluting stent (postdilated to 3.3 mm) with 0% residual stenosis and TIMI-3 flow. Recommendations: 1. Dual antiplatelet therapy with aspirin and ticagrelor for at least 12 months. 2. Aggressive secondary prevention, including high-intensity statin therapy and smoking cessation. 3. Medical management of chronic total occlusion of mid RCA as well as moderate ramus/distal LCx disease. 4. Remove right femoral artery sheath with manual compression two hours after discontinuation of bivalirudin. Yvonne Kendallhristopher End, MD [facility]   DG Chest Port 1 View  Result Date: 01/16/2020 CLINICAL DATA:  Substernal chest pain EXAM: PORTABLE CHEST 1 VIEW COMPARISON:  None. FINDINGS: The heart size and mediastinal contours are within normal limits. Aortic knob calcifications are seen. Subsegmental atelectasis seen at both lung bases. The visualized skeletal structures are unremarkable. IMPRESSION: No active disease. Electronically Signed   By: Jonna ClarkBindu  Avutu M.D.   On: 01/16/2020 23:47   ECHOCARDIOGRAM COMPLETE  Result Date: 01/17/2020    ECHOCARDIOGRAM REPORT   Patient Name:   Christina Larsen Date of Exam: 01/17/2020 Medical Rec #:  [id number]     Height:       65.0 in Accession #:    [id number]    Weight:       149.0 lb Date of Birth:  04/01/1955      BSA:          1.746 m Patient Age:    64 years      BP:           102/72 mmHg Patient Gender: F             HR:           51 bpm. Exam Location:  Inpatient Procedure: 2D Echo, Cardiac Doppler, Color Doppler and Intracardiac            Opacification Agent Indications:    NSTEMI I21.4  History:        Patient has no prior history of Echocardiogram examinations.                 Risk Factors:Diabetes, Dyslipidemia and Current Smoker.  Sonographer:    Renella CunasJulia Swaim RDCS Referring  Phys: [id number] SCOTT W ROSE IMPRESSIONS  1. Left ventricular ejection fraction, by estimation, is 55%. The left ventricle has normal function. The left ventricle has no regional wall motion abnormalities. Left ventricular diastolic parameters were normal.  2. Right ventricular systolic function is normal. The right ventricular size is normal.  3. The mitral valve is normal in structure. No evidence of mitral valve regurgitation. No evidence  of mitral stenosis.  4. The aortic valve is normal in structure. Aortic valve regurgitation is not visualized. Mild aortic valve sclerosis is present, with no evidence of aortic valve stenosis.  5. Aortic dilatation noted. There is mild dilatation of the ascending aorta, measuring 40 mm.  6. The inferior vena cava is normal in size with greater than 50% respiratory variability, suggesting right atrial pressure of 3 mmHg. FINDINGS  Left Ventricle: Left ventricular ejection fraction, by estimation, is 55%. The left ventricle has normal function. The left ventricle has no regional wall motion abnormalities. Definity contrast agent was given IV to delineate the left ventricular endocardial borders. The left ventricular internal cavity size was normal in size. There is no left ventricular hypertrophy. Left ventricular diastolic parameters were normal. Right Ventricle: The right ventricular size is normal. No increase in right ventricular wall thickness. Right ventricular systolic function is normal. Left Atrium: Left atrial size was normal in size. Right Atrium: Right atrial size was normal in size. Pericardium: There is no evidence of pericardial effusion. Mitral Valve: The mitral valve is normal in structure. No evidence of mitral valve regurgitation. No evidence of mitral valve stenosis. Tricuspid Valve: The tricuspid valve is normal in structure. Tricuspid valve regurgitation is not demonstrated. No evidence of tricuspid stenosis. Aortic Valve: The aortic valve is normal in  structure. Aortic valve regurgitation is not visualized. Mild aortic valve sclerosis is present, with no evidence of aortic valve stenosis. Pulmonic Valve: The pulmonic valve was normal in structure. Pulmonic valve regurgitation is not visualized. No evidence of pulmonic stenosis. Aorta: The aortic root is normal in size and structure and aortic dilatation noted. There is mild dilatation of the ascending aorta, measuring 40 mm. Venous: The inferior vena cava is normal in size with greater than 50% respiratory variability, suggesting right atrial pressure of 3 mmHg. IAS/Shunts: No atrial level shunt detected by color flow Doppler.  LEFT VENTRICLE PLAX 2D LVIDd:         4.50 cm      Diastology LVIDs:         3.50 cm      LV e' medial:    5.98 cm/s LV PW:         0.90 cm      LV E/e' medial:  11.3 LV IVS:        0.90 cm      LV e' lateral:   5.57 cm/s LVOT diam:     2.20 cm      LV E/e' lateral: 12.1 LV SV:         72 LV SV Index:   41 LVOT Area:     3.80 cm  LV Volumes (MOD) LV vol d, MOD A2C: 129.0 ml LV vol d, MOD A4C: 143.0 ml LV vol s, MOD A2C: 70.1 ml LV vol s, MOD A4C: 62.1 ml LV SV MOD A2C:     58.9 ml LV SV MOD A4C:     143.0 ml LV SV MOD BP:      64.9 ml RIGHT VENTRICLE RV S prime:     10.20 cm/s TAPSE (M-mode): 2.1 cm LEFT ATRIUM             Index       RIGHT ATRIUM           Index LA diam:        2.80 cm 1.60 cm/m  RA Area:     10.60 cm LA Vol (A2C):   34.4 ml  19.71 ml/m RA Volume:   24.90 ml  14.27 ml/m LA Vol (A4C):   33.3 ml 19.08 ml/m LA Biplane Vol: 37.7 ml 21.60 ml/m  AORTIC VALVE LVOT Vmax:   87.70 cm/s LVOT Vmean:  57.700 cm/s LVOT VTI:    0.190 m  AORTA Ao Root diam: 3.60 cm Ao Asc diam:  4.00 cm MITRAL VALVE MV Area (PHT): 3.74 cm    SHUNTS MV Decel Time: 203 msec    Systemic VTI:  0.19 m MV E velocity: 67.30 cm/s  Systemic Diam: 2.20 cm MV A velocity: 52.30 cm/s MV E/A ratio:  1.29 Charlton Haws MD Electronically signed by Charlton Haws MD Signature Date/Time: 01/17/2020/9:34:52 AM    Final      Cardiac Studies   Conclusions: 1. Significant two-vessel coronary artery disease with occlusions of the proximal LCx (acute with heavy thrombus burden) and mid RCA (chronic with left-to-right collaterals).  Moderate, non-obstructive coronary artery disease is also present in the ramus intermedius and distal LCx (codominant vessel). 2. Mid LAD myocardial bridge. 3. Mildly elevated left ventricular filling pressure with lateral wall hypokinesis and mildly reduced contraction. 4. Successful PCI to proximal LCx with aspiration thrombectomy and placement of Resolute Onyx 3.0 x 18 mm drug-eluting stent (postdilated to 3.3 mm) with 0% residual stenosis and TIMI-3 flow.  Recommendations: 1. Dual antiplatelet therapy with aspirin and ticagrelor for at least 12 months. 2. Aggressive secondary prevention, including high-intensity statin therapy and smoking cessation. 3. Medical management of chronic total occlusion of mid RCA as well as moderate ramus/distal LCx disease. 4. Remove right femoral artery sheath with manual compression two hours after discontinuation of bivalirudin  Intervention    ECHO IMPRESSIONS  1. Left ventricular ejection fraction, by estimation, is 55%. The left  ventricle has normal function. The left ventricle has no regional wall  motion abnormalities. Left ventricular diastolic parameters were normal.  2. Right ventricular systolic function is normal. The right ventricular  size is normal.  3. The mitral valve is normal in structure. No evidence of mitral valve  regurgitation. No evidence of mitral stenosis.  4. The aortic valve is normal in structure. Aortic valve regurgitation is  not visualized. Mild aortic valve sclerosis is present, with no evidence  of aortic valve stenosis.  5. Aortic dilatation noted. There is mild dilatation of the ascending  aorta, measuring 40 mm.  6. The inferior vena cava is normal in size with greater than 50%  respiratory  variability, suggesting right atrial pressure of 3 mmHg.   Patient Profile     64 y.o. female with PMH of HLD, DM and tobacco use who presented with chest pain/pressure and found to have elevated troponin.  Assessment & Plan    1. NSTEMI: Catheterization data reviewed.  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  She required aspiration thrombectomy for significant clot burden and ultimate stenting to her proximal circumflex.  There is distal circumflex disease.  Presently she feels exceptionally well.  There is not been any recurrent chest pressure or heaviness.  Plan to continue aspirin/Brilinta for minimum of 1 year.  Will try to titrate beta-blocker therapy today if blood pressure allows.  Medical therapy for concomitant CAD.  2.  Hyperlipidemia: Atorvastatin has been increased to 80 mg daily  and attempt to induce plaque regression.  3.  Right rotator cuff surgery: Initial surgery in June, redo surgery 1 week ago.  Right arm is in a stabilizer.  Since patient is on aspirin and Brilinta would prefer not to use any nonsteroidal anti-inflammatory therapy if at all possible.  Will ask occupational therapy to see patient today per recommendation of cardiac rehab.  4.  Type 2 diabetes mellitus: Previous meds were Metformin and Trulicity.  Metformin will be on hold for 48 hours post procedure.  Trulicity is given weekly, prescribed by Dr. Jacky Kindle.  Hemoglobin A1c 7.3  5.  Tobacco use: Patient admitted to intermittent smoking.  Discussed the importance of complete smoking cessation.  She is committed.  We will keep in hospital today.  Attempt to slightly titrate beta-blocker therapy as blood pressure allows.  Cardiac rehab to work with patient will obtain  occupational therapy consult.  Tentative plan for discharge tomorrow if stable.  Signed, [name], MD, [facility] 01/18/2020, 8:31 AM

## 2020-01-19 ENCOUNTER — Other Ambulatory Visit (HOSPITAL_COMMUNITY): Payer: Self-pay | Admitting: Cardiology

## 2020-01-19 ENCOUNTER — Encounter (HOSPITAL_COMMUNITY): Payer: Self-pay | Admitting: Cardiology

## 2020-01-19 LAB — BASIC METABOLIC PANEL
Anion gap: 10 (ref 5–15)
BUN: 12 mg/dL (ref 8–23)
CO2: 23 mmol/L (ref 22–32)
Calcium: 8.5 mg/dL — ABNORMAL LOW (ref 8.9–10.3)
Chloride: 104 mmol/L (ref 98–111)
Creatinine, Ser: 0.61 mg/dL (ref 0.44–1.00)
GFR, Estimated: >60 mL/min (ref >60)
Glucose, Bld: 155 mg/dL — ABNORMAL HIGH (ref 70–99)
Potassium: 3.5 mmol/L (ref 3.5–5.1)
Sodium: 137 mmol/L (ref 135–145)

## 2020-01-19 LAB — GLUCOSE, CAPILLARY
Glucose-Capillary: 140 mg/dL — ABNORMAL HIGH (ref 70–99)
Glucose-Capillary: 141 mg/dL — ABNORMAL HIGH (ref 70–99)

## 2020-01-19 MED ORDER — ATORVASTATIN CALCIUM 80 MG PO TABS
80.0000 mg | ORAL_TABLET | Freq: Every day | ORAL | 1 refills | Status: DC
Start: 2020-01-20 — End: 2020-01-19

## 2020-01-19 MED ORDER — ATORVASTATIN CALCIUM 80 MG PO TABS
80.0000 mg | ORAL_TABLET | Freq: Every day | ORAL | 3 refills | Status: DC
Start: 2020-01-20 — End: 2020-03-14

## 2020-01-19 MED ORDER — TICAGRELOR 90 MG PO TABS
90.0000 mg | ORAL_TABLET | Freq: Two times a day (BID) | ORAL | 2 refills | Status: DC
Start: 2020-01-19 — End: 2020-01-19

## 2020-01-19 MED ORDER — METOPROLOL TARTRATE 25 MG PO TABS
25.0000 mg | ORAL_TABLET | Freq: Two times a day (BID) | ORAL | 0 refills | Status: DC
Start: 2020-01-19 — End: 2020-01-19

## 2020-01-19 MED ORDER — METOPROLOL TARTRATE 25 MG PO TABS
25.0000 mg | ORAL_TABLET | Freq: Two times a day (BID) | ORAL | 3 refills | Status: DC
Start: 2020-01-19 — End: 2020-03-14

## 2020-01-19 MED ORDER — ASPIRIN 81 MG PO TBEC
81.0000 mg | DELAYED_RELEASE_TABLET | Freq: Every day | ORAL | 11 refills | Status: DC
Start: 2020-01-20 — End: 2020-01-19

## 2020-01-19 MED ORDER — NITROGLYCERIN 0.4 MG SL SUBL: 0.4000 mg | SUBLINGUAL_TABLET | SUBLINGUAL | 2 refills | Status: DC | PRN

## 2020-01-19 MED ORDER — ASPIRIN 81 MG PO TBEC
81.0000 mg | DELAYED_RELEASE_TABLET | Freq: Every day | ORAL | 3 refills | Status: DC
Start: 2020-01-20 — End: 2020-06-12

## 2020-01-19 MED ORDER — TICAGRELOR 90 MG PO TABS
90.0000 mg | ORAL_TABLET | Freq: Two times a day (BID) | ORAL | 3 refills | Status: DC
Start: 2020-01-19 — End: 2020-03-14

## 2020-01-19 MED FILL — BRILINTA 90 MG TABLET: 90 | 30 days supply | Qty: 60 | Fill #0

## 2020-01-19 MED FILL — ASPIRIN LOW DOSE 81 MG TBEC: 81 | 30 days supply | Qty: 30 | Fill #0

## 2020-01-19 MED FILL — METOPROLOL TARTRATE 25 MG T: 25 | 90 days supply | Qty: 180 | Fill #0

## 2020-01-19 MED FILL — ATORVASTATIN CALCIUM 80 MG: 80 | 90 days supply | Qty: 90 | Fill #0

## 2020-01-19 MED FILL — NITROGLYCERIN 0.4 MG TAB SL: 0.4 | 8 days supply | Qty: 25 | Fill #0

## 2020-01-19 NOTE — Progress Notes (Signed)
CARDIAC REHAB PHASE I   PRE:  Rate/Rhythm: 73 SR    BP: sitting 84/56    SaO2: 93 RA  MODE:  Ambulation: 400 ft   POST:  Rate/Rhythm: 83 SR    BP: sitting 91/70     SaO2: 94 RA   Pt feeling a little stronger. BP low but no lightheadedness today. Has not received metroprolol yet. SaO2 improved after she worked on IS yesterday and walked. No questions, ready for d/c. [phone]  Christina Larsen CES, ACSM 01/19/2020 8:49 AM

## 2020-01-19 NOTE — Plan of Care (Signed)

## 2020-01-19 NOTE — TOC Transition Note (Signed)
Transition of Care [facility]) - CM/SW Discharge Note   Patient Details  Name: Christina Larsen MRN: [id number] Date of Birth: Apr 20, 1955  [facility] CM/SW Contact:  Leone Haven, RN Phone Number: 01/19/2020, 12:44 PM   Clinical Narrative:    NCM spoke with patient, informed her about her copay for brilinta, TOC will fill the first 30 days for patient.     Final next level of care: Home/Self Care Barriers to Discharge: No Barriers Identified   Patient Goals and CMS Choice Patient states their goals for this hospitalization and ongoing recovery are:: get better   Choice offered to / list presented to : NA  Discharge Placement                       Discharge Plan and Services                          HH Arranged: NA          Social Determinants of Health (SDOH) Interventions     Readmission Risk Interventions No flowsheet data found.

## 2020-01-19 NOTE — Discharge Summary (Signed)
Discharge Summary    Patient ID: Christina Larsen MRN: [id number]; DOB: [birth year]  Admit date: 01/16/2020 Discharge date: 01/19/2020  Primary Care Provider: Geoffry Paradise, MD  Primary Cardiologist: Christina Guadalajara, MD  Primary Electrophysiologist:  None   Discharge Diagnoses    Principal Problem:   NSTEMI (non-ST elevated myocardial infarction) [facility]) Active Problems:   Type 2 diabetes mellitus (HCC)   Hyperlipidemia   Current smoker  Diagnostic Studies/Procedures    Cath: 01/17/20  Conclusions: 1. Significant two-vessel coronary artery disease with occlusions of the proximal LCx (acute with heavy thrombus burden) and mid RCA (chronic with left-to-right collaterals).  Moderate, non-obstructive coronary artery disease is also present in the ramus intermedius and distal LCx (codominant vessel). 2. Mid LAD myocardial bridge. 3. Mildly elevated left ventricular filling pressure with lateral wall hypokinesis and mildly reduced contraction. 4. Successful PCI to proximal LCx with aspiration thrombectomy and placement of Resolute Onyx 3.0 x 18 mm drug-eluting stent (postdilated to 3.3 mm) with 0% residual stenosis and TIMI-3 flow.  Recommendations: 1. Dual antiplatelet therapy with aspirin and ticagrelor for at least 12 months. 2. Aggressive secondary prevention, including high-intensity statin therapy and smoking cessation. 3. Medical management of chronic total occlusion of mid RCA as well as moderate ramus/distal LCx disease. 4. Remove right femoral artery sheath with manual compression two hours after discontinuation of bivalirudin.  Christina Kendall, MD  Diagnostic Dominance: Co-dominant  Intervention   Echo: 01/17/20  IMPRESSIONS    1. Left ventricular ejection fraction, by estimation, is 55%. The left  ventricle has normal function. The left ventricle has no regional wall  motion abnormalities. Left ventricular diastolic parameters were normal.  2. Right ventricular  systolic function is normal. The right ventricular  size is normal.  3. The mitral valve is normal in structure. No evidence of mitral valve  regurgitation. No evidence of mitral stenosis.  4. The aortic valve is normal in structure. Aortic valve regurgitation is  not visualized. Mild aortic valve sclerosis is present, with no evidence  of aortic valve stenosis.  5. Aortic dilatation noted. There is mild dilatation of the ascending  aorta, measuring 40 mm.  6. The inferior vena cava is normal in size with greater than 50%  respiratory variability, suggesting right atrial pressure of 3 mmHg.  _____________   History of Present Illness     Christina Larsen is a 64 y.o. female with past medical history of diabetes, hypertension, hyperlipidemia, tobacco use and recent rotator cuff surgery who presented with chest pain. Earlier the evening of admission, beginning at approximately 9:30 this evening, she developed sudden onset chest pressure while at rest that was associated with nausea, vomiting, and cold sweats. The pain had a somewhat waxing and waning course but hadnot completely subsided since onset. It was not pleuritic or positional. Prior to this episode, she has never had anything similar. Prior to her recent rotator cuff surgery, she was walking her dog regularly without any anginal symptoms or exertional intolerance. She currently denies any recent dyspnea. No orthopnea or leg swelling. No dizziness or syncope.   She was taken initially to [name] by her neighbor. Serial ECGs showed anteroseptal ST depression and some nonspecific ST changes in the anterolateral and possibly the inferior leads (interpretation limited by baseline sway). Troponin was elevated to 85. She was given aspirin and started on heparin infusion with bolus. Pain began improving with nitroglycerin infusion. She was transferred here for further management. On arrival to [name], still having 2/10 chest pain,  but improving. ECG  repeated on arrival and ST changes are nearly resolved.  She was hemodynamically stable, though with low-normal BP on current nitro infusion rate at 15 mcg/min.   Admitted to cardiology for further management   Hospital Course     1. 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  She required aspiration thrombectomy for significant clot burden and ultimate stenting to her proximal circumflex.  High-sensitivity troponin peaked at 860.  Placed on DAPT with aspirin/Brilinta for minimum of 1 year. Did relatively well with cardiac rehab.  No recurrent chest pain.  Echo showed normal EF of 55% with no regional wall motion abnormalities. --Continue aspirin, Brilinta, statin, beta-blocker  2.  Hyperlipidemia: Atorvastatin was increased to 80 mg daily and attempt to induce plaque regression. --LFTs/FLP in 8 weeks  3.  Right rotator cuff surgery: Initial surgery in June, redo surgery 1 week prior to admission.  Right arm remained in a stabilizer.  Since patient is on aspirin and Brilinta would prefer not to use any nonsteroidal anti-inflammatory therapy if at all possible.   --Has scheduled follow-up with surgeon  4.  Type 2 diabetes mellitus: Previous meds were Metformin and Trulicity. Trulicity is given weekly, prescribed by Dr. Jacky Larsen.  Hemoglobin A1c 7.3 --Plan to resume home medications at discharge  5.  Tobacco use: Patient admitted to intermittent smoking.  Discussed the importance of complete smoking cessation.    Did the patient have an acute coronary syndrome (MI, NSTEMI, STEMI, etc) this admission?:  Yes                               AHA/ACC Clinical Performance & Quality Measures: 5. Aspirin prescribed? - Yes 6. ADP  Receptor Inhibitor (Plavix/Clopidogrel, Brilinta/Ticagrelor or Effient/Prasugrel) prescribed (includes medically managed patients)? - Yes 7. Beta Blocker prescribed? - Yes 8. High Intensity Statin (Lipitor 40-80mg  or Crestor 20-40mg ) prescribed? - Yes 9. EF assessed during THIS hospitalization? - Yes 10. For EF <40%, was ACEI/ARB prescribed? - Not Applicable (EF >/= 40%) 11. For EF <40%, Aldosterone Antagonist (Spironolactone or Eplerenone) prescribed? - Not Applicable (EF >/= 40%) 12. Cardiac Rehab Phase II ordered (including medically managed patients)? - Yes   _____________  Discharge Vitals Blood pressure 115/69, pulse 69, temperature 100.2 F (37.9 C), temperature source Oral, resp. rate 16, height  (1.651 m), weight 67.6 kg, SpO2 98 %.  Filed Weights   01/16/20 2303  Weight: 67.6 kg    Labs & Radiologic Studies    CBC Recent Labs    01/16/20 2345 01/18/20 0043  WBC 13.2* 14.5*  HGB 15.6* 13.0  HCT 45.6 39.1  MCV 91.9 94.4  PLT 257 192   Basic Metabolic Panel Recent Labs    16/10/96 2345 01/17/20 1206 01/18/20 0043 01/19/20 0035  NA   < >  --  136 137  K   < >  --  3.8 3.5  CL   < >  --  105 104  CO2   < >  --  20* 23  GLUCOSE   < >  --  185* 155*  BUN   < >  --  19 12  CREATININE   < >  --  0.66 0.61  CALCIUM   < >  --  9.0 8.5*  MG  --  2.0  --   --    < > = values in this interval not displayed.   Liver Function Tests No results for input(s): AST, ALT, ALKPHOS, BILITOT, PROT, ALBUMIN in the last 72 hours. No results for input(s): LIPASE, AMYLASE in the last 72 hours. High Sensitivity Troponin:   Recent Labs  Lab 01/16/20 2345 01/17/20 0235  TROPONINIHS 85* 860*    BNP Invalid input(s): POCBNP D-Dimer Recent Labs    01/16/20 2345  DDIMER 0.54*   Hemoglobin A1C Recent Labs    01/17/20 0811  HGBA1C 7.3*   Fasting Lipid Panel Recent Labs    01/17/20 1206  CHOL 155  HDL 37*  LDLCALC 81  TRIG [id number]*  CHOLHDL 4.2   Thyroid  Function Tests No results for input(s): TSH, T4TOTAL, T3FREE, THYROIDAB in the last 72 hours.  Invalid input(s): FREET3 _____________  CARDIAC CATHETERIZATION  Result Date: 01/17/2020 Conclusions: 1. Significant two-vessel coronary artery disease with occlusions of the proximal LCx (acute with heavy thrombus burden) and mid RCA (chronic with left-to-right collaterals).  Moderate, non-obstructive coronary artery disease is also present in the ramus intermedius and distal LCx (codominant vessel). 2. Mid LAD myocardial bridge. 3. Mildly elevated left ventricular filling pressure with lateral wall hypokinesis and mildly reduced contraction. 4. Successful PCI to proximal LCx with aspiration thrombectomy and placement of Resolute Onyx 3.0 x 18 mm drug-eluting stent (postdilated to 3.3 mm) with 0% residual stenosis and TIMI-3 flow. Recommendations: 1. Dual antiplatelet therapy with aspirin and ticagrelor for at least 12 months. 2. Aggressive secondary prevention, including high-intensity statin therapy and smoking cessation. 3. Medical management of chronic total occlusion of mid RCA as well as moderate ramus/distal LCx disease. 4. Remove right femoral artery sheath with manual compression two hours after discontinuation of bivalirudin. Christina Kendallhristopher End, MD [facility]   DG Chest Port 1 View  Result Date: 01/16/2020 CLINICAL DATA:  Substernal chest pain EXAM: PORTABLE CHEST 1 VIEW COMPARISON:  None. FINDINGS: The heart size and mediastinal contours are within normal limits. Aortic knob calcifications are seen. Subsegmental atelectasis seen at both lung bases. The visualized skeletal structures are unremarkable. IMPRESSION: No active disease. Electronically Signed   By: Jonna ClarkBindu  Avutu M.D.   On: 01/16/2020 23:47   ECHOCARDIOGRAM COMPLETE  Result Date: 01/17/2020    ECHOCARDIOGRAM REPORT   Patient Name:   Christina Larsen Date of Exam: 01/17/2020 Medical Rec #:  [id number]     Height:       65.0 in Accession #:     [id number]    Weight:       149.0 lb Date of Birth:  01/04/1956      BSA:          1.746 m Patient Age:    64 years      BP:           102/72 mmHg Patient Gender: F             HR:           51 bpm. Exam Location:  Inpatient Procedure: 2D Echo, Cardiac Doppler, Color Doppler and Intracardiac            Opacification Agent Indications:    NSTEMI I21.4  History:        Patient has no prior history of Echocardiogram examinations.  Risk Factors:Diabetes, Dyslipidemia and Current Smoker.  Sonographer:    Renella Cunas RDCS Referring Phys: [id number] SC[facility] IMPRESSIONS  1. Left ventricular ejection fraction, by estimation, is 55%. The left ventricle has normal function. The left ventricle has no regional wall motion abnormalities. Left ventricular diastolic parameters were normal.  2. Right ventricular systolic function is normal. The right ventricular size is normal.  3. The mitral valve is normal in structure. No evidence of mitral valve regurgitation. No evidence of mitral stenosis.  4. The aortic valve is normal in structure. Aortic valve regurgitation is not visualized. Mild aortic valve sclerosis is present, with no evidence of aortic valve stenosis.  5. Aortic dilatation noted. There is mild dilatation of the ascending aorta, measuring 40 mm.  6. The inferior vena cava is normal in size with greater than 50% respiratory variability, suggesting right atrial pressure of 3 mmHg. FINDINGS  Left Ventricle: Left ventricular ejection fraction, by estimation, is 55%. The left ventricle has normal function. The left ventricle has no regional wall motion abnormalities. Definity contrast agent was given IV to delineate the left ventricular endocardial borders. The left ventricular internal cavity size was normal in size. There is no left ventricular hypertrophy. Left ventricular diastolic parameters were normal. Right Ventricle: The right ventricular size is normal. No increase in right ventricular wall  thickness. Right ventricular systolic function is normal. Left Atrium: Left atrial size was normal in size. Right Atrium: Right atrial size was normal in size. Pericardium: There is no evidence of pericardial effusion. Mitral Valve: The mitral valve is normal in structure. No evidence of mitral valve regurgitation. No evidence of mitral valve stenosis. Tricuspid Valve: The tricuspid valve is normal in structure. Tricuspid valve regurgitation is not demonstrated. No evidence of tricuspid stenosis. Aortic Valve: The aortic valve is normal in structure. Aortic valve regurgitation is not visualized. Mild aortic valve sclerosis is present, with no evidence of aortic valve stenosis. Pulmonic Valve: The pulmonic valve was normal in structure. Pulmonic valve regurgitation is not visualized. No evidence of pulmonic stenosis. Aorta: The aortic root is normal in size and structure and aortic dilatation noted. There is mild dilatation of the ascending aorta, measuring 40 mm. Venous: The inferior vena cava is normal in size with greater than 50% respiratory variability, suggesting right atrial pressure of 3 mmHg. IAS/Shunts: No atrial level shunt detected by color flow Doppler.  LEFT VENTRICLE PLAX 2D LVIDd:         4.50 cm      Diastology LVIDs:         3.50 cm      LV e' medial:    5.98 cm/s LV PW:         0.90 cm      LV E/e' medial:  11.3 LV IVS:        0.90 cm      LV e' lateral:   5.57 cm/s LVOT diam:     2.20 cm      LV E/e' lateral: 12.1 LV SV:         72 LV SV Index:   41 LVOT Area:     3.80 cm  LV Volumes (MOD) LV vol d, MOD A2C: 129.0 ml LV vol d, MOD A4C: 143.0 ml LV vol s, MOD A2C: 70.1 ml LV vol s, MOD A4C: 62.1 ml LV SV MOD A2C:     58.9 ml LV SV MOD A4C:     143.0 ml LV SV MOD BP:  64.9 ml RIGHT VENTRICLE RV S prime:     10.20 cm/s TAPSE (M-mode): 2.1 cm LEFT ATRIUM             Index       RIGHT ATRIUM           Index LA diam:        2.80 cm 1.60 cm/m  RA Area:     10.60 cm LA Vol (A2C):   34.4 ml 19.71  ml/m RA Volume:   24.90 ml  14.27 ml/m LA Vol (A4C):   33.3 ml 19.08 ml/m LA Biplane Vol: 37.7 ml 21.60 ml/m  AORTIC VALVE LVOT Vmax:   87.70 cm/s LVOT Vmean:  57.700 cm/s LVOT VTI:    0.190 m  AORTA Ao Root diam: 3.60 cm Ao Asc diam:  4.00 cm MITRAL VALVE MV Area (PHT): 3.74 cm    SHUNTS MV Decel Time: 203 msec    Systemic VTI:  0.19 m MV E velocity: 67.30 cm/s  Systemic Diam: 2.20 cm MV A velocity: 52.30 cm/s MV E/A ratio:  1.29 Charlton Haws MD Electronically signed by Charlton Haws MD Signature Date/Time: 01/17/2020/9:34:52 AM    Final    Disposition   Pt is being discharged home today in good condition.  Follow-up Plans & Appointments     Follow-up Information    Rosalio Macadamia, NP Follow up on 02/08/2020.   Specialties: Nurse Practitioner, Interventional Cardiology, Cardiology, Radiology Why: at 2:15pm for your follow up appt.  Contact information: [address] [place] [address] [phone]              Discharge Instructions    Amb Referral to Cardiac Rehabilitation   Complete by: As directed    Diagnosis:  Coronary Stents NSTEMI PTCA     After initial evaluation and assessments completed: Virtual Based Care may be provided alone or in conjunction with Phase 2 Cardiac Rehab based on patient barriers.: Yes   Call MD for:  redness, tenderness, or signs of infection (pain, swelling, redness, odor or green/yellow discharge around incision site)   Complete by: As directed    Diet - low sodium heart healthy   Complete by: As directed    Discharge instructions   Complete by: As directed    Groin Site Care Refer to this sheet in the next few weeks. These instructions provide you with information on caring for yourself after your procedure. Your caregiver may also give you more specific instructions. Your treatment has been planned according to current medical practices, but problems sometimes occur. Call your caregiver if you have any problems or  questions after your procedure. HOME CARE INSTRUCTIONS You may shower 24 hours after the procedure. Remove the bandage (dressing) and gently wash the site with plain soap and water. Gently pat the site dry.  Do not apply powder or lotion to the site.  Do not sit in a bathtub, swimming pool, or whirlpool for 5 to 7 days.  No bending, squatting, or lifting anything over 10 pounds (4.5 kg) as directed by your caregiver.  Inspect the site at least twice daily.  Do not drive home if you are discharged the same day of the procedure. Have someone else drive you.  You may drive 24 hours after the procedure unless otherwise instructed by your caregiver.  What to expect: Any bruising will usually fade within 1 to 2 weeks.  Blood that collects in the tissue (hematoma) may be painful to the  touch. It should usually decrease in size and tenderness within 1 to 2 weeks.  SEEK IMMEDIATE MEDICAL CARE IF: You have unusual pain at the groin site or down the affected leg.  You have redness, warmth, swelling, or pain at the groin site.  You have drainage (other than a small amount of blood on the dressing).  You have chills.  You have a fever or persistent symptoms for more than 72 hours.  You have a fever and your symptoms suddenly get worse.  Your leg becomes pale, cool, tingly, or numb.  You have heavy bleeding from the site. Hold pressure on the site. Marland Kitchen  PLEASE DO NOT MISS ANY DOSES OF YOUR BRILINTA!!!!! Also keep a log of you blood pressures and bring back to your follow up appt. Please call the office with any questions.   Patients taking blood thinners should generally stay away from medicines like ibuprofen, Advil, Motrin, naproxen, and Aleve due to risk of stomach bleeding. You may take Tylenol as directed or talk to your primary doctor about alternatives.  PLEASE ENSURE THAT YOU DO NOT RUN OUT OF YOUR BRILINTA/PLAVIX. This medication is very important to remain on for at least one year. IF you have  issues obtaining this medication due to cost please CALL the office 3-5 business days prior to running out in order to prevent missing doses of this medication.   Increase activity slowly   Complete by: As directed       Discharge Medications   Allergies as of 01/19/2020      Reactions   Hydrocodone-acetaminophen    Pt gets sick      Medication List    STOP taking these medications   olmesartan-hydrochlorothiazide 40-25 MG tablet Commonly known as: BENICAR HCT     TAKE these medications   aspirin 81 MG EC tablet Take 1 tablet (81 mg total) by mouth daily. Swallow whole. Start taking on: January 20, 2020   atorvastatin 80 MG tablet Commonly known as: LIPITOR Take 1 tablet (80 mg total) by mouth daily. Start taking on: January 20, 2020 What changed:   medication strength  how much to take   gabapentin 300 MG capsule Commonly known as: NEURONTIN Take 1 capsule (300 mg total) by mouth at bedtime. What changed:   when to take this  reasons to take this   metFORMIN 1000 MG tablet Commonly known as: GLUCOPHAGE Take 1,000 mg by mouth every evening.   metoprolol tartrate 25 MG tablet Commonly known as: LOPRESSOR Take 1 tablet (25 mg total) by mouth 2 (two) times daily.   nitroGLYCERIN 0.4 MG SL tablet Commonly known as: Nitrostat Place 1 tablet (0.4 mg total) under the tongue every 5 (five) minutes as needed.   oxyCODONE-acetaminophen 5-325 MG tablet Commonly known as: PERCOCET/ROXICET Take 1-2 tablets by mouth every 4 (four) hours as needed for severe pain.   ticagrelor 90 MG Tabs tablet Commonly known as: BRILINTA Take 1 tablet (90 mg total) by mouth 2 (two) times daily.   Trulicity 1.5 MG/0.5ML Sopn Generic drug: Dulaglutide Inject 1.5 mg into the muscle every Sunday.       Outstanding Labs/Studies   FLP/LFTs in 8 weeks  Duration of Discharge Encounter   Greater than 30 minutes including physician time.  Signed, Laverda Page,  NP 01/19/2020, 11:49 AM

## 2020-01-19 NOTE — Progress Notes (Addendum)
Progress Note  Patient Name: Christina Larsen Date of Encounter: 01/19/2020  Primary Cardiologist: new, [name], MD   Subjective   No recurrent chest pain; ambulating  Inpatient Medications    Scheduled Meds: . aspirin EC  81 mg Oral Daily  . atorvastatin  80 mg Oral Daily  . enoxaparin (LOVENOX) injection  40 mg Subcutaneous Q24H  . influenza vaccine adjuvanted  0.5 mL Intramuscular Tomorrow-1000  . insulin aspart  0-15 Units Subcutaneous TID WC  . insulin aspart  0-5 Units Subcutaneous QHS  . metoprolol tartrate  25 mg Oral BID  . sodium chloride flush  3 mL Intravenous Q12H  . ticagrelor  90 mg Oral BID   Continuous Infusions: . sodium chloride     PRN Meds: sodium chloride, gabapentin, ondansetron (ZOFRAN) IV, oxyCODONE-acetaminophen, sodium chloride flush   Vital Signs    Vitals:   01/18/20 1922 01/18/20 2312 01/19/20 0303 01/19/20 0712  BP: 105/71 98/75 101/77 100/69  Pulse: 70 74 70 67  Resp: 20 20 15 20   Temp: 98.9 F (37.2 C) 98.5 F (36.9 C) 98.2 F (36.8 C) 98.5 F (36.9 C)  TempSrc: Oral Oral Oral Oral  SpO2: 91% 93% 94% 90%  Weight:      Height:        Intake/Output Summary (Last 24 hours) at 01/19/2020 1005 Last data filed at 01/18/2020 2123 Gross per 24 hour  Intake 845 ml  Output --  Net 845 ml    I/O since admission: +[phone]  [facility] Weights   01/16/20 2303  Weight: 67.6 kg    Telemetry    Sinus - Personally Reviewed  ECG    ECG (independently read by me): NSR at 65; no STT changes  Physical Exam   BP 100/69 (BP Location: Left Arm)   Pulse 67   Temp 98.5 F (36.9 C) (Oral)   Resp 20   Ht 5\' 5"  (1.651 m)   Wt 67.6 kg   SpO2 90%   BMI 24.79 kg/m  General: Alert, oriented, no distress.  Skin: normal turgor, no rashes, warm and dry HEENT: Normocephalic, atraumatic. Pupils equal round and reactive to light; sclera anicteric; extraocular muscles intact; Fundi ** Nose without nasal septal hypertrophy Mouth/Parynx  benign; Mallinpatti scale Neck: No JVD, no carotid bruits; normal carotid upstroke Lungs: clear to ausculatation and percussion; no wheezing or rales Chest wall: without tenderness to palpitation Heart: PMI not displaced, RRR, s1 s2 normal, 1/6 systolic murmur, no diastolic murmur, no rubs, gallops, thrills, or heaves Abdomen: soft, nontender; no hepatosplenomehaly, BS+; abdominal aorta nontender and not dilated by palpation. Back: no CVA tenderness Pulses 2+ right groin site stable without hematoma or ecchymosis Musculoskeletal: full range of motion, normal strength, no joint deformities Extremities: R arm in arm splint; no clubbing cyanosis or edema, Homan's sign negative  Neurologic: grossly nonfocal; Cranial nerves grossly wnl Psychologic: Normal mood and affect      Labs    Chemistry Recent Labs  Lab 01/16/20 2345 01/18/20 0043 01/19/20 0035  NA 137 136 137  K 3.8 3.8 3.5  CL 99 105 104  CO2 24 20* 23  GLUCOSE 172* 185* 155*  BUN 10 19 12   CREATININE 0.55 0.66 0.61  CALCIUM 9.7 9.0 8.5*  GFRNONAA >60 >60 >60  ANIONGAP 14 11 10      Hematology Recent Labs  Lab 01/16/20 2345 01/18/20 0043  WBC 13.2* 14.5*  RBC 4.96 4.14  HGB 15.6* 13.0  HCT 45.6 39.1  MCV 91.9 94.4  MCH 31.5 31.4  MCHC 34.2 33.2  RDW 13.7 13.8  PLT 257 192    Cardiac EnzymesNo results for input(s): TROPONINI in the last 168 hours. No results for input(s): TROPIPOC in the last 168 hours.   BNPNo results for input(s): BNP, PROBNP in the last 168 hours.   DDimer  Recent Labs  Lab 01/16/20 2345  DDIMER 0.54*     Lipid Panel     Component Value Date/Time   CHOL 155 01/17/2020 1206   TRIG 187 (H) 01/17/2020 1206   HDL 37 (L) 01/17/2020 1206   CHOLHDL 4.2 01/17/2020 1206   VLDL 37 01/17/2020 1206   LDLCALC 81 01/17/2020 1206     Radiology    CARDIAC CATHETERIZATION  Result Date: 01/17/2020 Conclusions: 1. Significant two-vessel coronary artery disease with occlusions of the  proximal LCx (acute with heavy thrombus burden) and mid RCA (chronic with left-to-right collaterals).  Moderate, non-obstructive coronary artery disease is also present in the ramus intermedius and distal LCx (codominant vessel). 2. Mid LAD myocardial bridge. 3. Mildly elevated left ventricular filling pressure with lateral wall hypokinesis and mildly reduced contraction. 4. Successful PCI to proximal LCx with aspiration thrombectomy and placement of Resolute Onyx 3.0 x 18 mm drug-eluting stent (postdilated to 3.3 mm) with 0% residual stenosis and TIMI-3 flow. Recommendations: 1. Dual antiplatelet therapy with aspirin and ticagrelor for at least 12 months. 2. Aggressive secondary prevention, including high-intensity statin therapy and smoking cessation. 3. Medical management of chronic total occlusion of mid RCA as well as moderate ramus/distal LCx disease. 4. Remove right femoral artery sheath with manual compression two hours after discontinuation of bivalirudin. Yvonne Kendall, MD [facility]    Cardiac Studies   Conclusions: 1. Significant two-vessel coronary artery disease with occlusions of the proximal LCx (acute with heavy thrombus burden) and mid RCA (chronic with left-to-right collaterals).  Moderate, non-obstructive coronary artery disease is also present in the ramus intermedius and distal LCx (codominant vessel). 2. Mid LAD myocardial bridge. 3. Mildly elevated left ventricular filling pressure with lateral wall hypokinesis and mildly reduced contraction. 4. Successful PCI to proximal LCx with aspiration thrombectomy and placement of Resolute Onyx 3.0 x 18 mm drug-eluting stent (postdilated to 3.3 mm) with 0% residual stenosis and TIMI-3 flow.  Recommendations: 1. Dual antiplatelet therapy with aspirin and ticagrelor for at least 12 months. 2. Aggressive secondary prevention, including high-intensity statin therapy and smoking cessation. 3. Medical management of chronic total  occlusion of mid RCA as well as moderate ramus/distal LCx disease. 4. Remove right femoral artery sheath with manual compression two hours after discontinuation of bivalirudin  Intervention    ECHO IMPRESSIONS  1. Left ventricular ejection fraction, by estimation, is 55%. The left  ventricle has normal function. The left ventricle has no regional wall  motion abnormalities. Left ventricular diastolic parameters were normal.  2. Right ventricular systolic function is normal. The right ventricular  size is normal.  3. The mitral valve is normal in structure. No evidence of mitral valve  regurgitation. No evidence of mitral stenosis.  4. The aortic valve is normal in structure. Aortic valve regurgitation is  not visualized. Mild aortic valve sclerosis is present, with no evidence  of aortic valve stenosis.  5. Aortic dilatation noted. There is mild dilatation of the ascending  aorta, measuring 40 mm.  6. The inferior vena cava is normal in size with greater than 50%  respiratory variability, suggesting right atrial pressure of 3 mmHg.   Patient Profile  64 y.o. female with PMH of HLD, DM and tobacco use who presented with chest pain/pressure and found to have elevated troponin.  Assessment & Plan    1. NSTEMI: Catheterization data reviewed.  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  She required aspiration thrombectomy for significant clot burden and ultimate stenting to her proximal circumflex.  There is distal circumflex disease.  She continues to be remained stable without recurrent chest pain.  This morning she did notice some mild shortness of breath with walking.  Telemetry shows sinus rhythm in the seventies.  Continue beta-blocker  therapy as blood pressure and pulse allow.  Continue aspirin/Brilinta for minimum of 1 year.  2.  Hyperlipidemia: Atorvastatin has been increased to 80 mg daily and attempt to induce plaque regression.  She is tolerating medication.  3.  Right rotator cuff surgery: Initial surgery in June, redo surgery 1 week ago.  Right arm is in a stabilizer.  Since patient is on aspirin and Brilinta would prefer not to use any nonsteroidal anti-inflammatory therapy if at all possible.  Patient was seen by Occupational Therapy yesterday.  She is stable no need for further evaluation.  4.  Type 2 diabetes mellitus: Previous meds were Metformin and Trulicity.  Metformin will be on hold for 48 hours post procedure.  Trulicity is given weekly, prescribed by Dr. Jacky Kindle.  Hemoglobin A1c 7.3  5.  Tobacco use: Patient admitted to intermittent smoking prior to admission.  She had been counseled on smoking cessation and intends to no longer smoke.   Will discharge today with plans for [facility] visit in 1 week with APP and office follow-up with me subsequently.  Signed, [name], MD, [facility] 01/19/2020, 10:05 AM

## 2020-01-21 ENCOUNTER — Telehealth: Payer: Self-pay | Admitting: Cardiovascular Disease

## 2020-01-21 NOTE — Telephone Encounter (Signed)
Spoke with the patient, will keep current appt with Lawson Fiscal

## 2020-01-21 NOTE — Telephone Encounter (Signed)
Patient was recently admitted to the hospital on 01/16/20 for NSTEMI. Upon discharge, she was prescribed Brillinta. Patient states she began taking this medication on 01/19/20. Ever since starting Brillinta she reports experiencing SOB when she lays down at night. Pt has had no other symptoms. SOB only occurs at night once she goes to bed.   Pt read that this is a possible side effect of Brillinta and would like to know if she can switch to something else.   Advised patient I would route to Dr. Tresa Endo and PharmD for advisement.

## 2020-01-21 NOTE — Telephone Encounter (Signed)
I spoke with Christina Larsen.  We discussed her overall symptom, it does sound like she is symptomatic from Brilinta as her shortness of breath spontaneously occurs at rest and does not occurs with physical activity.  I did recommend to drink some caffeine to help with her symptoms.  She is aware that about 14% of people who takes Brilinta will have shortness of breath as their main side effect.  However out of those 14% population, a lot of the patient's symptoms will eventually resolve by themselves.  If the symptoms still persist on the next follow-up, we can potentially consider switching her to Plavix after a single 600 mg loading dose.  She is aware that in clinical trials, Brilinta is a superior drug compared to the generic Plavix.  If at all possible, she should continue on the Brilinta.  We may have to fill out medication assistance form for her since she is on Medicare and has limited income.  If so, we will try to bridge her with samples on the next visit.   She is also aware that it is absolutely important she is compliant with dual antiplatelet therapy and should never come off of a antiplatelet drug without talking to her cardiologist.  In conclusion, for the time being, she was to continue on the Brilinta with as needed caffeinated drinks to help with the symptom.

## 2020-01-21 NOTE — Telephone Encounter (Signed)
Pt c/o medication issue:  1. Name of Medication: ticagrelor (BRILINTA) 90 MG TABS tablet  2. How are you currently taking this medication (dosage and times per day)? 1 tablet (90 mg total) by mouth 2 (two) times daily  3. Are you having a reaction (difficulty breathing--STAT)? Yes   4. What is your medication issue? Patient was prescribed Brilinta in the hospital. She states ever since the night of 01/19/20, she has been experiencing mild SOB. Per patient, the episodes of SOB are only at night when she is laying down. However, she assumes Brilinta is the cause of this SOB due to the listed side effects.     Pt c/o Shortness Of Breath: STAT if SOB developed within the last 24 hours or pt is noticeably SOB on the phone  1. Are you currently SOB (can you hear that pt is SOB on the phone)? No  2. How long have you been experiencing SOB? Since the night of 01/19/20  3. Are you SOB when sitting or when up moving around? When laying down in bed at night   4. Are you currently experiencing any other symptoms?  No

## 2020-01-21 NOTE — Telephone Encounter (Signed)
thanks

## 2020-01-24 ENCOUNTER — Telehealth (HOSPITAL_COMMUNITY): Payer: Self-pay

## 2020-01-24 NOTE — Telephone Encounter (Signed)
Pt insurance is active and benefits verified through [employer]. Co-pay $15.00, DED $0.00/$0.00 met, out of pocket $3,400.00/$0.00 met, co-insurance 0%. No pre-authorization required. Kavina/HTA, 01/24/20 @ 327PM, TMA#[id number]  Will contact patient to see if she is interested in the Cardiac Rehab Program. If interested, patient will need to complete follow up appt. Once completed, patient will be contacted for scheduling upon review by the RN Navigator.

## 2020-01-24 NOTE — Telephone Encounter (Signed)
Attempted to call patient in regards to Cardiac Rehab - LM on VM 

## 2020-01-25 DIAGNOSIS — I214 Non-ST elevation (NSTEMI) myocardial infarction: Secondary | ICD-10-CM | POA: Diagnosis not present

## 2020-01-25 DIAGNOSIS — Z720 Tobacco use: Secondary | ICD-10-CM | POA: Diagnosis not present

## 2020-01-25 DIAGNOSIS — E1169 Type 2 diabetes mellitus with other specified complication: Secondary | ICD-10-CM | POA: Diagnosis not present

## 2020-01-25 DIAGNOSIS — M75111 Incomplete rotator cuff tear or rupture of right shoulder, not specified as traumatic: Secondary | ICD-10-CM | POA: Diagnosis not present

## 2020-01-25 DIAGNOSIS — I10 Essential (primary) hypertension: Secondary | ICD-10-CM | POA: Diagnosis not present

## 2020-01-25 DIAGNOSIS — B373 Candidiasis of vulva and vagina: Secondary | ICD-10-CM | POA: Diagnosis not present

## 2020-01-25 DIAGNOSIS — Z4789 Encounter for other orthopedic aftercare: Secondary | ICD-10-CM | POA: Diagnosis not present

## 2020-01-25 DIAGNOSIS — G5601 Carpal tunnel syndrome, right upper limb: Secondary | ICD-10-CM | POA: Diagnosis not present

## 2020-01-25 DIAGNOSIS — M25511 Pain in right shoulder: Secondary | ICD-10-CM | POA: Diagnosis not present

## 2020-01-25 DIAGNOSIS — Z9889 Other specified postprocedural states: Secondary | ICD-10-CM | POA: Diagnosis not present

## 2020-01-25 DIAGNOSIS — E7800 Pure hypercholesterolemia, unspecified: Secondary | ICD-10-CM | POA: Diagnosis not present

## 2020-01-25 NOTE — Progress Notes (Signed)
CARDIOLOGY OFFICE NOTE  Date:  02/08/2020    Christina Larsen Date of Birth: October 04, 1955 Medical Record #[id number]  PCP:  Geoffry Paradise, MD  Cardiologist:  Tresa Endo   Chief Complaint  Patient presents with  . Follow-up  . Hospitalization Follow-up    Seen for Dr. Tresa Endo    History of Present Illness: Christina Larsen is a 64 y.o. female who presents today for a post hospital visit. Seen for Dr. Tresa Endo (NEW).   She has a history of diabetes, hypertension, hyperlipidemia, tobacco use and recent rotator cuff surgery.   Presented earlier this month with chest pain. She was taken initially to [facility] ED by her neighbor. Serial ECGs showed anteroseptal ST depression and some nonspecific ST changes in the anterolateral and possibly the inferior leads (interpretation limited by baseline sway). Troponin was elevated to 85. She was given aspirin and started on heparin infusion with bolus. Pain began improving with nitroglycerin infusion. She was transferred to [facility] for further management. She underwent cath - ended up having thrombectomy and subsequent stenting of the LCX. Placed on DAPT.   Comes in today. Here alone. She is doing ok but has had some shortness of breath - feels like she has to take a deep breath on occasion - had been waking up in the night with this. She did call and was told to try some caffeine. It is improving with caffeine and is not waking up with this anymore. Cost is probably going to be prohibitive. Only has 4 pills left. Will see if we can get some assistance. Little shaky at times - vague description. No chest pain. She has stopped smoking - was smoking 4 to 5 cigs per day. She notes she has had a vaginal discharge. PCP recommended Monistat and did not give Diflucan. She wants to resume her vitamins and Melatonin.  Asking about cold medicines if needed.   Past Medical History:  Diagnosis Date  . Coronary artery disease    11/9 NSTEMI, PCI to pLCx with aspiration  thrombectomy/DESx1, occluded mRCA with left to right collaterals  . Diabetes mellitus without complication (HCC)   . HLD (hyperlipidemia)     Past Surgical History:  Procedure Laterality Date  . CHOLECYSTECTOMY  1992  . CORONARY STENT INTERVENTION N/A 01/17/2020   Procedure: CORONARY STENT INTERVENTION;  Surgeon: Yvonne Kendall, MD;  Location: [facility] INVASIVE CV LAB;  Service: Cardiovascular;  Laterality: N/A;  . LAMINECTOMY  2006  . LEFT HEART CATH AND CORONARY ANGIOGRAPHY N/A 01/17/2020   Procedure: LEFT HEART CATH AND CORONARY ANGIOGRAPHY;  Surgeon: Yvonne Kendall, MD;  Location: [facility] INVASIVE CV LAB;  Service: Cardiovascular;  Laterality: N/A;  . rotator cuff surgery       Medications: Current Meds  Medication Sig  . aspirin 81 MG EC tablet Take 1 tablet (81 mg total) by mouth daily. Swallow whole.  Marland Kitchen atorvastatin (LIPITOR) 80 MG tablet Take 1 tablet (80 mg total) by mouth daily.  Marland Kitchen gabapentin (NEURONTIN) 300 MG capsule Take 300 mg by mouth as needed (for nerve pain).  . metFORMIN (GLUCOPHAGE) 1000 MG tablet Take 1,000 mg by mouth every evening.   . metoprolol tartrate (LOPRESSOR) 25 MG tablet Take 1 tablet (25 mg total) by mouth 2 (two) times daily.  . nitroGLYCERIN (NITROSTAT) 0.4 MG SL tablet Place 1 tablet (0.4 mg total) under the tongue every 5 (five) minutes as needed.  . ticagrelor (BRILINTA) 90 MG TABS tablet Take 1 tablet (90 mg total) by mouth 2 (two)  times daily.  . TRULICITY 1.5 MG/0.5ML SOPN Inject 1.5 mg into the muscle every Sunday.      Allergies: Allergies  Allergen Reactions  . Hydrocodone-Acetaminophen     Pt gets sick    Social History: The patient  reports that she has been smoking. She has never used smokeless tobacco. She reports current alcohol use of about 4.0 standard drinks of alcohol per week. She reports previous drug use.   Family History: The patient's family history includes Colon cancer in her mother; Diabetes in her mother;  Hypercholesterolemia in her father; Hypertension in her brother; Prostate cancer in her father.   Review of Systems: Please see the history of present illness.   All other systems are reviewed and negative.   Physical Exam: VS:  BP 116/64   Pulse 90   Ht 5\' 5"  (1.651 m)   Wt 150 lb (68 kg)   SpO2 98%   BMI 24.96 kg/m  .  BMI Body mass index is 24.96 kg/m.  Wt Readings from Last 3 Encounters:  02/08/20 150 lb (68 kg)  01/16/20 149 lb (67.6 kg)  05/07/18 153 lb (69.4 kg)    General: Alert and in no acute distress.   Cardiac: Regular rate and rhythm. No murmurs, rubs, or gallops. No edema.  Respiratory:  Lungs are clear to auscultation bilaterally with normal work of breathing.  GI: Soft and nontender.  MS: No deformity or atrophy. Gait and ROM intact. Right shoulder in sling.   Skin: Warm and dry. Color is normal.  Neuro:  Strength and sensation are intact and no gross focal deficits noted.  Psych: Alert, appropriate and with normal affect.   LABORATORY DATA:  EKG:  EKG is not ordered today.    Lab Results  Component Value Date   WBC 14.5 (H) 01/18/2020   HGB 13.0 01/18/2020   HCT 39.1 01/18/2020   PLT 192 01/18/2020   GLUCOSE 155 (H) 01/19/2020   CHOL 155 01/17/2020   TRIG 187 (H) 01/17/2020   HDL 37 (L) 01/17/2020   LDLCALC 81 01/17/2020   NA 137 01/19/2020   K 3.5 01/19/2020   CL 104 01/19/2020   CREATININE 0.61 01/19/2020   BUN 12 01/19/2020   CO2 23 01/19/2020   TSH 0.691 05/07/2018   HGBA1C 7.3 (H) 01/17/2020     BNP (last 3 results) No results for input(s): BNP in the last 8760 hours.  ProBNP (last 3 results) No results for input(s): PROBNP in the last 8760 hours.   Other Studies Reviewed Today:  Cath: 01/17/20  Conclusions: 1. Significant two-vessel coronary artery disease with occlusions of the proximal LCx (acute with heavy thrombus burden) and mid RCA (chronic with left-to-right collaterals). Moderate, non-obstructive coronary artery  disease is also present in the ramus intermedius and distal LCx (codominant vessel). 2. Mid LAD myocardial bridge. 3. Mildly elevated left ventricular filling pressure with lateral wall hypokinesis and mildly reduced contraction. 4. Successful PCI to proximal LCx with aspiration thrombectomy and placement of Resolute Onyx 3.0 x 18 mm drug-eluting stent (postdilated to 3.3 mm) with 0% residual stenosis and TIMI-3 flow.  Recommendations: 1. Dual antiplatelet therapy with aspirin and ticagrelor for at least 12 months. 2. Aggressive secondary prevention, including high-intensity statin therapy and smoking cessation. 3. Medical management of chronic total occlusion of mid RCA as well as moderate ramus/distal LCx disease. 4. Remove right femoral artery sheath with manual compression two hours after discontinuation of bivalirudin.  13/8/21, MD  Diagnostic Dominance: Co-dominant  Intervention   Echo: 01/17/20  IMPRESSIONS  1. Left ventricular ejection fraction, by estimation, is 55%. The left  ventricle has normal function. The left ventricle has no regional wall  motion abnormalities. Left ventricular diastolic parameters were normal.  2. Right ventricular systolic function is normal. The right ventricular  size is normal.  3. The mitral valve is normal in structure. No evidence of mitral valve  regurgitation. No evidence of mitral stenosis.  4. The aortic valve is normal in structure. Aortic valve regurgitation is  not visualized. Mild aortic valve sclerosis is present, with no evidence  of aortic valve stenosis.  5. Aortic dilatation noted. There is mild dilatation of the ascending  aorta, measuring 40 mm.  6. The inferior vena cava is normal in size with greater than 50%  respiratory variability, suggesting right atrial pressure of 3 mmHg.  _____________   Assessment/Plan:  1. NSTEMI - has had cath - has CTO of the RCA with acute thrombotic occlusion of the pLCX  - dRCA collateralized via septal perforators arising from the LAD. Also with myocardial bridging of the LAD and has moderate disease of the RI - she has had aspiration thrombectomy due to clot burden and ultimate stenting of the LCX. She is on DAPT - echo with preserved EF. She is interested in cardiac rehab - may need to modify. Follow up lab today.   2. HLD - on high dose statin. Needs fasting labs on return.   3. Rotator cuff surgery - per ortho.   4. DM - per PCP  5. Tobacco abuse - she is not smoking.   6. Vaginal discharge - may need GYN referral. I do not think this is related to her current regimen.   Current medicines are reviewed with the patient today.  The patient does not have concerns regarding medicines other than what has been noted above.  The following changes have been made:  See above.  Labs/ tests ordered today include:    Orders Placed This Encounter  Procedures  . Basic metabolic panel  . CBC     Disposition:   FU with Dr. Tresa Endo and his team at [facility] in 4 to 6 weeks with fasting labs.    Patient is agreeable to this plan and will call if any problems develop in the interim.   SignedNorma Fredrickson, NP  02/08/2020 2:48 PM  [facility] [address] [place], [place]  [address] Phone: [phone] Fax: [phone]

## 2020-02-08 ENCOUNTER — Other Ambulatory Visit: Payer: Self-pay

## 2020-02-08 ENCOUNTER — Encounter: Payer: Self-pay | Admitting: Nurse Practitioner

## 2020-02-08 ENCOUNTER — Ambulatory Visit: Payer: PPO | Admitting: Nurse Practitioner

## 2020-02-08 VITALS — BP 116/64 | HR 90 | Ht 65.0 in | Wt 150.0 lb

## 2020-02-08 DIAGNOSIS — I259 Chronic ischemic heart disease, unspecified: Secondary | ICD-10-CM

## 2020-02-08 DIAGNOSIS — E782 Mixed hyperlipidemia: Secondary | ICD-10-CM

## 2020-02-08 DIAGNOSIS — I10 Essential (primary) hypertension: Secondary | ICD-10-CM | POA: Diagnosis not present

## 2020-02-08 DIAGNOSIS — I214 Non-ST elevation (NSTEMI) myocardial infarction: Secondary | ICD-10-CM | POA: Diagnosis not present

## 2020-02-08 MED ORDER — FLUCONAZOLE 150 MG PO TABS: 150.0000 mg | ORAL_TABLET | Freq: Every day | ORAL | 0 refills | Status: DC

## 2020-02-08 NOTE — Patient Instructions (Addendum)
After Visit Summary:  We will be checking the following labs today - BMET and CBC   Medication Instructions:    Continue with your current medicines.   Samples of Brilinta  I have sent a message to the nurse to get you help with your Brilinta  Do not run out of Brilinta   If you need a refill on your cardiac medications before your next appointment, please call your pharmacy.     Testing/Procedures To Be Arranged:  N/A  Follow-Up:   See Dr. Tresa Endo and his team in 4 to 6 weeks with fasting labs.     At [facility], you and your health needs are our priority.  As part of our continuing mission to provide you with exceptional heart care, we have created designated Provider Care Teams.  These Care Teams include your primary Cardiologist (physician) and Advanced Practice Providers (APPs -  Physician Assistants and Nurse Practitioners) who all work together to provide you with the care you need, when you need it.  Special Instructions:  . Stay safe, wash your hands for at least 20 seconds and wear a mask when needed.  . It was good to talk with you today.  . Cold medicines - Coricidin HBP can use this or plain antihistamines. No decongestants. No Sudafed. Plain Robitussin ok for coughs.  . Stop smoking! Peri [place] blood sugar control! . Continue with your walking.  . I will send a message to cardiac rehab.    Call the [facility] office at [phone] if you have any questions, problems or concerns.

## 2020-02-09 ENCOUNTER — Telehealth (HOSPITAL_COMMUNITY): Payer: Self-pay | Admitting: *Deleted

## 2020-02-09 LAB — BASIC METABOLIC PANEL
BUN/Creatinine Ratio: 19 (ref 12–28)
BUN: 13 mg/dL (ref 8–27)
CO2: 23 mmol/L (ref 20–29)
Calcium: 9.5 mg/dL (ref 8.7–10.3)
Chloride: 105 mmol/L (ref 96–106)
Creatinine, Ser: 0.68 mg/dL (ref 0.57–1.00)
GFR calc Af Amer: 105 mL/min/{1.73_m2} (ref >59)
GFR calc non Af Amer: 91 mL/min/{1.73_m2} (ref >59)
Glucose: 216 mg/dL — ABNORMAL HIGH (ref 65–99)
Potassium: 3.8 mmol/L (ref 3.5–5.2)
Sodium: 141 mmol/L (ref 134–144)

## 2020-02-09 LAB — CBC
Hematocrit: 40.7 % (ref 34.0–46.6)
Hemoglobin: 14 g/dL (ref 11.1–15.9)
MCH: 31.3 pg (ref 26.6–33.0)
MCHC: 34.4 g/dL (ref 31.5–35.7)
MCV: 91 fL (ref 79–97)
Platelets: 204 10*3/uL (ref 150–450)
RBC: 4.47 x10E6/uL (ref 3.77–5.28)
RDW: 12.9 % (ref 11.7–15.4)
WBC: 9.3 10*3/uL (ref 3.4–10.8)

## 2020-02-09 NOTE — Telephone Encounter (Signed)
Spoke with patient about cardiac rehab. Patient is still under the care of her orthopedist from her rotator cuff repair surgery. Will follow up with Mrs Mikel at the end of the month. Patient will need clearance from her orthopedic surgeon to begin exercise at cardiac rehab. Dennie Bible is currently wearing a sling. Dennie Bible says that she is walking at home.Gladstone Lighter, RN,BSN 02/09/2020 9:31 AM

## 2020-02-10 ENCOUNTER — Telehealth: Payer: Self-pay

## 2020-02-10 NOTE — Telephone Encounter (Addendum)
**Note De-identified Euva Rundell Obfuscation** -----  **Note De-Identified Cherly Erno Obfuscation** Message from Rosalio Macadamia, NP sent at 02/08/2020  2:31 PM EST ----- Can you see if there is anyway to help with her Brilinta?

## 2020-02-10 NOTE — Telephone Encounter (Signed)
**Note De-Identified Bryar Dahms Obfuscation** The pt and I discussed her [place]st through [place] [employer] for her Brilinta and she is interested in applying. Per her request I called back and left a detailed message on her VM providing her with needed information to give to [place] when [place] over the [place]: 1.[place] phone number ([phone]) 2.MDs name: Dr [name] 3. His fax number 4. His address.  She is aware to call us back if she is advised that she is not eligible to be approved for the program.  I will forward this message to NL Triage as FYI.

## 2020-02-22 DIAGNOSIS — M25511 Pain in right shoulder: Secondary | ICD-10-CM | POA: Diagnosis not present

## 2020-02-22 DIAGNOSIS — G5601 Carpal tunnel syndrome, right upper limb: Secondary | ICD-10-CM | POA: Diagnosis not present

## 2020-02-22 DIAGNOSIS — Z4789 Encounter for other orthopedic aftercare: Secondary | ICD-10-CM | POA: Diagnosis not present

## 2020-02-22 DIAGNOSIS — M75111 Incomplete rotator cuff tear or rupture of right shoulder, not specified as traumatic: Secondary | ICD-10-CM | POA: Diagnosis not present

## 2020-02-23 ENCOUNTER — Telehealth (HOSPITAL_COMMUNITY): Payer: Self-pay | Admitting: *Deleted

## 2020-02-23 ENCOUNTER — Other Ambulatory Visit (HOSPITAL_BASED_OUTPATIENT_CLINIC_OR_DEPARTMENT_OTHER): Payer: Self-pay | Admitting: Internal Medicine

## 2020-02-23 ENCOUNTER — Telehealth: Payer: Self-pay | Admitting: Cardiovascular Disease

## 2020-02-23 NOTE — Telephone Encounter (Signed)
Returned call to dental group. Explained that for initial exam no clearance should be needed. If she needs deep cleanings, extractions, etc they would need to request clearance since she is on anti-platelets - advised they would need to provide # of extractions if local epi/lidocaine is to be used and provided her a fax # to send such request if necessary

## 2020-02-23 NOTE — Telephone Encounter (Signed)
Pt returned call.  Update on how her appt went on yesterday.  Dennie Bible will begin to wean the sling and has begun physical therapy.  Emphasized no strength training for 6 weeks.  Pt next follow up is on 04/04/20.  Will continue to hold from scheduling until she has been cleared to participate in cardiac rehab.  Will check back in with pt after 1/25. Alanson Aly, BSN Cardiac and Emergency planning/management officer

## 2020-02-23 NOTE — Telephone Encounter (Signed)
Bridgette from [employer] states that she was told to call us in regards to this patient's dentist appt. She stated she wasn't sure what we needed from her beings that this is the patients first appointment with them so it will just be an initial exam and xray. No work will be done and no medications need to be held prior. Please advise if surgical clearance is needed for this.

## 2020-02-23 NOTE — Telephone Encounter (Signed)
Left message for pt requesting call back to Cardiac rehab.  Pt with recent follow up with Orthopedist on 12/14.  Can see in care everywhere follow up visit with [facility] on 11/16.  Checking in with pt on how she is progressing from repeater rotator cuff repair on 11/1 for readiness to participate in cardiac rehab. Will also seek clearance for activity and restrictions prior to scheduling. Alanson Aly, BSN Cardiac and Emergency planning/management officer

## 2020-03-01 DIAGNOSIS — M25511 Pain in right shoulder: Secondary | ICD-10-CM | POA: Diagnosis not present

## 2020-03-06 DIAGNOSIS — M25511 Pain in right shoulder: Secondary | ICD-10-CM | POA: Diagnosis not present

## 2020-03-07 ENCOUNTER — Telehealth: Payer: Self-pay | Admitting: Cardiovascular Disease

## 2020-03-07 NOTE — Telephone Encounter (Signed)
   [facility] Pre-operative Risk Assessment    Request for surgical clearance:  1. What type of surgery is being performed? Periodontal Scaling and Root Planing  2. When is this surgery scheduled? 03/09/20  3. What type of clearance is required (medical clearance vs. Pharmacy clearance to hold med vs. Both)? Medical  4. Are there any medications that need to be held prior to surgery and how long? None.   5. Practice name and name of physician performing surgery? [facility], Dr. Virgel Manifold  6. What is your office phone number [phone]   7.   What is your office fax number [phone]  8.   Anesthesia type (None, local, MAC, general) ? Local   Christina Larsen 03/07/2020, 3:36 PM  _________________________________________________________________   (provider comments below)

## 2020-03-07 NOTE — Telephone Encounter (Signed)
   Primary Cardiologist: [name], MD  Chart reviewed as part of pre-operative protocol coverage.   Simple dental extractions and cleanings are considered low risk procedures per guidelines and generally do not require any specific cardiac clearance. It is also generally accepted that for simple extractions and dental cleanings, there is no need to interrupt blood thinner therapy.  SBE prophylaxis is not required for the patient from a cardiac standpoint.  Of note, she was hospitalized early November 2021 and underwent cardiac cath 01/17/20 with PCI/DES to prox LCx with aspiration thrombectomy. She was recommended for Aspirin/Brilinta for 12 months. As she is only 2.5 months from stenting, dual antiplatelet therapy (DAPT) may not be held at this time. I did call dental office to try to ensure they are aware of her DAPT and she may need more gauze during cleaning, but the line was busy.   I will route this recommendation to the requesting party via Epic fax function and remove from pre-op pool.  Please call with questions.  Christina Sorrow, NP 03/07/2020, 4:10 PM

## 2020-03-08 DIAGNOSIS — M25511 Pain in right shoulder: Secondary | ICD-10-CM | POA: Diagnosis not present

## 2020-03-13 MED FILL — TRULICITY 1.5 MG/0.5 ML PEN: 1.5 | 84 days supply | Qty: 6 | Fill #0

## 2020-03-14 ENCOUNTER — Other Ambulatory Visit: Payer: Self-pay | Admitting: Cardiovascular Disease

## 2020-03-14 ENCOUNTER — Other Ambulatory Visit: Payer: Self-pay

## 2020-03-14 ENCOUNTER — Ambulatory Visit (INDEPENDENT_AMBULATORY_CARE_PROVIDER_SITE_OTHER): Payer: PPO | Admitting: Cardiovascular Disease

## 2020-03-14 ENCOUNTER — Encounter: Payer: Self-pay | Admitting: Cardiovascular Disease

## 2020-03-14 DIAGNOSIS — Z79899 Other long term (current) drug therapy: Secondary | ICD-10-CM

## 2020-03-14 DIAGNOSIS — E785 Hyperlipidemia, unspecified: Secondary | ICD-10-CM

## 2020-03-14 DIAGNOSIS — Z87891 Personal history of nicotine dependence: Secondary | ICD-10-CM | POA: Diagnosis not present

## 2020-03-14 DIAGNOSIS — E118 Type 2 diabetes mellitus with unspecified complications: Secondary | ICD-10-CM

## 2020-03-14 DIAGNOSIS — I214 Non-ST elevation (NSTEMI) myocardial infarction: Secondary | ICD-10-CM | POA: Diagnosis not present

## 2020-03-14 DIAGNOSIS — M25511 Pain in right shoulder: Secondary | ICD-10-CM | POA: Diagnosis not present

## 2020-03-14 DIAGNOSIS — I259 Chronic ischemic heart disease, unspecified: Secondary | ICD-10-CM

## 2020-03-14 MED ORDER — ATORVASTATIN CALCIUM 80 MG PO TABS
80.0000 mg | ORAL_TABLET | Freq: Every day | ORAL | 3 refills | Status: DC
Start: 2020-03-14 — End: 2020-03-14

## 2020-03-14 MED ORDER — NITROGLYCERIN 0.4 MG SL SUBL
0.4000 mg | SUBLINGUAL_TABLET | SUBLINGUAL | 2 refills | Status: DC | PRN
Start: 2020-03-14 — End: 2020-03-14

## 2020-03-14 MED ORDER — TICAGRELOR 90 MG PO TABS
90.0000 mg | ORAL_TABLET | Freq: Two times a day (BID) | ORAL | 3 refills | Status: DC
Start: 2020-03-14 — End: 2020-05-09

## 2020-03-14 MED ORDER — METOPROLOL TARTRATE 25 MG PO TABS
25.0000 mg | ORAL_TABLET | Freq: Two times a day (BID) | ORAL | 3 refills | Status: DC
Start: 2020-03-14 — End: 2020-03-14

## 2020-03-14 MED FILL — NITROGLYCERIN 0.4 MG TAB SL: 0.4 | 7 days supply | Qty: 25 | Fill #0

## 2020-03-14 MED FILL — BRILINTA 90 MG TABLET: 90 | 90 days supply | Qty: 180 | Fill #0

## 2020-03-14 NOTE — Patient Instructions (Signed)
Medication Instructions:  No changes *If you need a refill on your cardiac medications before your next appointment, please call your pharmacy*   Lab Work: CMET, CBC, TSH and Lipid Profile - FASTING in our office today If you have labs (blood work) drawn today and your tests are completely normal, you will receive your results only by: Marland Kitchen MyChart Message (if you have MyChart) OR . A paper copy in the mail If you have any lab test that is abnormal or we need to change your treatment, we will call you to review the results.   Testing/Procedures: None ordered   Follow-Up: At [facility], you and your health needs are our priority.  As part of our continuing mission to provide you with exceptional heart care, we have created designated Provider Care Teams.  These Care Teams include your primary Cardiologist (physician) and Advanced Practice Providers (APPs -  Physician Assistants and Nurse Practitioners) who all work together to provide you with the care you need, when you need it.  We recommend signing up for the patient portal called "MyChart".  Sign up information is provided on this After Visit Summary.  MyChart is used to connect with patients for Virtual Visits (Telemedicine).  Patients are able to view lab/test results, encounter notes, upcoming appointments, etc.  Non-urgent messages can be sent to your provider as well.   To learn more about what you can do with MyChart, go to [email or web address].    Your next appointment:   4 month(s)  The format for your next appointment:   In Person  Provider:   [name], MD   Other Instructions None

## 2020-03-15 DIAGNOSIS — Z79899 Other long term (current) drug therapy: Secondary | ICD-10-CM | POA: Diagnosis not present

## 2020-03-15 LAB — COMPREHENSIVE METABOLIC PANEL
ALT: 28 IU/L (ref 0–32)
AST: 21 IU/L (ref 0–40)
Albumin/Globulin Ratio: 1.8 (ref 1.2–2.2)
Albumin: 4.5 g/dL (ref 3.8–4.8)
Alkaline Phosphatase: 97 IU/L (ref 44–121)
BUN/Creatinine Ratio: 25 (ref 12–28)
BUN: 14 mg/dL (ref 8–27)
Bilirubin Total: 0.7 mg/dL (ref 0.0–1.2)
CO2: 20 mmol/L (ref 20–29)
Calcium: 9.7 mg/dL (ref 8.7–10.3)
Chloride: 103 mmol/L (ref 96–106)
Creatinine, Ser: 0.57 mg/dL (ref 0.57–1.00)
GFR calc Af Amer: 112 mL/min/{1.73_m2} (ref >59)
GFR calc non Af Amer: 97 mL/min/{1.73_m2} (ref >59)
Globulin, Total: 2.5 g/dL (ref 1.5–4.5)
Glucose: 120 mg/dL — ABNORMAL HIGH (ref 65–99)
Potassium: 4.2 mmol/L (ref 3.5–5.2)
Sodium: 140 mmol/L (ref 134–144)
Total Protein: 7 g/dL (ref 6.0–8.5)

## 2020-03-15 LAB — LIPID PANEL
Chol/HDL Ratio: 2.8 ratio (ref 0.0–4.4)
Cholesterol, Total: 117 mg/dL (ref 100–199)
HDL: 42 mg/dL (ref >39)
LDL Chol Calc (NIH): 52 mg/dL (ref 0–99)
Triglycerides: 130 mg/dL (ref 0–149)
VLDL Cholesterol Cal: 23 mg/dL (ref 5–40)

## 2020-03-15 LAB — TSH: TSH: 0.535 u[IU]/mL (ref 0.450–4.500)

## 2020-03-15 NOTE — Progress Notes (Signed)
Cardiology Office Note    Date:  03/16/2020   ID:  Christina Larsen, DOB Feb 01, 1956, MRN [id number]  PCP:  Burnard Bunting, MD  Cardiologist:  Shelva Majestic, MD   Initial office visit with me following her recent non-STEMI and hospitalization  History of Present Illness:  Christina Larsen is a 65 y.o. female who is originally from [place].  She has a history of diabetes mellitus, hypertension, hyperlipidemia, and tobacco use.  She had undergone initial rotator cuff surgery in June 2021 and underwent a redo surgery at the end of October.  While watching [facility], she had developed significant chest pain and presented to [facility] where ECG showed anteroseptal ST depression.  Troponin was elevated to 85 > 860.  She was given aspirin and started on heparin and nitroglycerin.  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  She required aspiration thrombectomy for significant clot burden and underwent successful stenting of her proximal circumflex vessel.  There was distal circumflex disease treated medically.  I had seen her during her hospitalization.  We had a lengthy discussion regarding absolute smoking cessation and since her presentation she has not had any further cigarettes.  She has been undergoing physical therapy for her redo shoulder surgery.  She was seen for initial post hospital follow-up on November 30 by Kathrynn Humble, NP.  Presently, she feels well.  She denies any recurrent chest pain symptomatology.  She is unaware of palpitations.  She continues to be on aspirin and Brilinta for DAPT for minimum of 1 year, and is on metoprolol tartrate 25 mg twice a day.  She is tolerating atorvastatin 80 mg  daily and is on metformin and Trulicity for her diabetes mellitus.  She has peripheral neuropathy and is on gabapentin.  She presents for initial office evaluation with me.    Past Medical History:  Diagnosis Date  . Coronary artery disease    11/9 NSTEMI, PCI to pLCx with aspiration thrombectomy/DESx1, occluded mRCA with left to right collaterals  . Diabetes mellitus without complication ([place])   . HLD (hyperlipidemia)     Past Surgical History:  Procedure Laterality Date  . CHOLECYSTECTOMY  1992  . CORONARY STENT INTERVENTION N/A 01/17/2020   Procedure: CORONARY STENT INTERVENTION;  Surgeon: Nelva Bush, MD;  Location: [place] [facility];  Service: Cardiovascular;  Laterality: N/A;  . LAMINECTOMY  2006  . LEFT HEART CATH AND CORONARY ANGIOGRAPHY N/A 01/17/2020   Procedure: LEFT HEART CATH AND CORONARY ANGIOGRAPHY;  Surgeon: Nelva Bush, MD;  Location: [place] [facility];  Service: Cardiovascular;  Laterality: N/A;  . rotator cuff surgery      Current Medications: Outpatient Medications Prior to Visit  Medication Sig Dispense Refill  . aspirin 81 MG EC tablet Take 1 tablet (81 mg total) by mouth daily. Swallow whole. 90 tablet 3  . gabapentin (NEURONTIN) 300 MG capsule Take 300 mg by mouth as needed (for nerve pain).    . metFORMIN (GLUCOPHAGE) 1000 MG tablet Take 1,000 mg by mouth 2 (two) times daily with a meal.    . TRULICITY 1.5 AJ/6.8TL SOPN Inject 1.5 mg into the muscle every Sunday.     Marland Kitchen atorvastatin (LIPITOR) 80 MG tablet Take 1 tablet (80 mg  total) by mouth daily. 90 tablet 3  . metoprolol tartrate (LOPRESSOR) 25 MG tablet Take 1 tablet (25 mg total) by mouth 2 (two) times daily. 180 tablet 3  . nitroGLYCERIN (NITROSTAT) 0.4 MG SL tablet Place 1 tablet (0.4 mg total) under the tongue every 5 (five) minutes as needed. 25 tablet 2  . ticagrelor (BRILINTA) 90 MG TABS tablet Take 1 tablet (90 mg total) by mouth 2 (two) times daily. 180 tablet 3   No  facility-administered medications prior to visit.     Allergies:   Hydrocodone-acetaminophen   Social History   Socioeconomic History  . Marital status: Widowed    Spouse name: Not on file  . Number of children: Not on file  . Years of education: Not on file  . Highest education level: Not on file  Occupational History  . Not on file  Tobacco Use  . Smoking status: Current Some Day Smoker    Last attempt to quit: 2015    Years since quitting: 7.0  . Smokeless tobacco: Never Used  Substance and Sexual Activity  . Alcohol use: Yes    Alcohol/week: 4.0 standard drinks    Types: 4 Glasses of wine per week  . Drug use: Not Currently  . Sexual activity: Not on file  Other Topics Concern  . Not on file  Social History Narrative  . Not on file   Social Determinants of Health   Financial Resource Strain: Not on file  Food Insecurity: Not on file  Transportation Needs: Not on file  Physical Activity: Not on file  Stress: Not on file  Social Connections: Not on file    Socially she lives by herself.  She quit smoking the day of her heart attack.  She had been divorced from her husband who subsequently has died.  She has children.  Family History:  The patient's  family history includes Colon cancer in her mother; Diabetes in her mother; Hypercholesterolemia in her father; Hypertension in her brother; Prostate cancer in her father.   ROS General: Negative; No fevers, chills, or night sweats;  HEENT: Negative; No changes in vision or hearing, sinus congestion, difficulty swallowing Pulmonary: Negative; No cough, wheezing, shortness of breath, hemoptysis Cardiovascular: Negative; No chest pain, presyncope, syncope, palpitations GI: Negative; No nausea, vomiting, diarrhea, or abdominal pain GU: Negative; No dysuria, hematuria, or difficulty voiding Musculoskeletal: Negative; no myalgias, joint pain, or weakness Hematologic/Oncology: Negative; no easy bruising,  bleeding Endocrine: Negative; no heat/cold intolerance; no diabetes Neuro: Negative; no changes in balance, headaches Skin: Negative; No rashes or skin lesions Psychiatric: Negative; No behavioral problems, depression Sleep: Negative; No snoring, daytime sleepiness, hypersomnolence, bruxism, restless legs, hypnogognic hallucinations, no cataplexy Other comprehensive 14 point system review is negative.   PHYSICAL EXAM:   VS:  BP 136/74   Pulse 68   Ht 5' 5"  (1.651 m)   Wt 150 lb (68 kg)   BMI 24.96 kg/m     BP blood pressure by me was 124/72  Wt Readings from Last 3 Encounters:  03/14/20 150 lb (68 kg)  02/08/20 150 lb (68 kg)  01/16/20 149 lb (67.6 kg)    General: Alert, oriented, no distress.  Skin: normal turgor, no rashes, warm and dry HEENT: Normocephalic, atraumatic. Pupils equal round and reactive to light; sclera anicteric; extraocular muscles intact;  Nose without nasal septal hypertrophy Mouth/Parynx benign; Mallinpatti scale 2 Neck: No JVD, no carotid bruits; normal carotid upstroke Lungs: clear to ausculatation and percussion; no wheezing or  rales Chest wall: without tenderness to palpitation Heart: PMI not displaced, RRR, s1 s2 normal, 1/6 systolic murmur, no diastolic murmur, no rubs, gallops, thrills, or heaves Abdomen: soft, nontender; no hepatosplenomehaly, BS+; abdominal aorta nontender and not dilated by palpation. Back: no CVA tenderness Pulses 2+ Musculoskeletal: full range of motion, normal strength, no joint deformities Extremities: no clubbing cyanosis or edema, Homan's sign negative  Neurologic: grossly nonfocal; Cranial nerves grossly wnl Psychologic: Normal mood and affect   Studies/Labs Reviewed:   EKG:  EKG is  ordered today.  ECG (independently read by me): Normal sinus rhythm at 68 bpm.  Left axis deviation.  Nondiagnostic T wave abnormality.  Recent Labs: BMP Latest Ref Rng & Units 03/15/2020 02/08/2020 01/19/2020  Glucose 65 - 99 mg/dL  120(H) 216(H) 155(H)  BUN 8 - 27 mg/dL 14 13 12   Creatinine 0.57 - 1.00 mg/dL 0.57 0.68 0.61  BUN/Creat Ratio 12 - 28 25 19  -  Sodium 134 - 144 mmol/L 140 141 137  Potassium 3.5 - 5.2 mmol/L 4.2 3.8 3.5  Chloride 96 - 106 mmol/L 103 105 104  CO2 20 - 29 mmol/L 20 23 23   Calcium 8.7 - 10.3 mg/dL 9.7 9.5 8.5(L)     Hepatic Function Latest Ref Rng & Units 03/15/2020  Total Protein 6.0 - 8.5 g/dL 7.0  Albumin 3.8 - 4.8 g/dL 4.5  AST 0 - 40 IU/L 21  ALT 0 - 32 IU/L 28  Alk Phosphatase 44 - 121 IU/L 97  Total Bilirubin 0.0 - 1.2 mg/dL 0.7    CBC Latest Ref Rng & Units 02/08/2020 01/18/2020 01/16/2020  WBC 3.4 - 10.8 x10E3/uL 9.3 14.5(H) 13.2(H)  Hemoglobin 11.1 - 15.9 g/dL 14.0 13.0 15.6(H)  Hematocrit 34.0 - 46.6 % 40.7 39.1 45.6  Platelets 150 - 450 x10E3/uL 204 192 257   Lab Results  Component Value Date   MCV 91 02/08/2020   MCV 94.4 01/18/2020   MCV 91.9 01/16/2020   Lab Results  Component Value Date   TSH 0.535 03/15/2020   Lab Results  Component Value Date   HGBA1C 7.3 (H) 01/17/2020     BNP No results found for: BNP  ProBNP No results found for: PROBNP   Lipid Panel     Component Value Date/Time   CHOL 117 03/15/2020 0812   TRIG 130 03/15/2020 0812   HDL 42 03/15/2020 0812   CHOLHDL 2.8 03/15/2020 0812   CHOLHDL 4.2 01/17/2020 1206   VLDL 37 01/17/2020 1206   LDLCALC 52 03/15/2020 0812   LABVLDL 23 03/15/2020 0812     RADIOLOGY: No results found.   Additional studies/ records that were reviewed today include:   01/17/2020 Conclusions: 1. Significant two-vessel coronary artery disease with occlusions of the proximal LCx (acute with heavy thrombus burden) and mid RCA (chronic with left-to-right collaterals). Moderate, non-obstructive coronary artery disease is also present in the ramus intermedius and distal LCx (codominant vessel). 2. Mid LAD myocardial bridge. 3. Mildly elevated left ventricular filling pressure with lateral wall hypokinesis and  mildly reduced contraction. 4. Successful PCI to proximal LCx with aspiration thrombectomy and placement of Resolute Onyx 3.0 x 18 mm drug-eluting stent (postdilated to 3.3 mm) with 0% residual stenosis and TIMI-3 flow.  Recommendations: 1. Dual antiplatelet therapy with aspirin and ticagrelor for at least 12 months. 2. Aggressive secondary prevention, including high-intensity statin therapy and smoking cessation. 3. Medical management of chronic total occlusion of mid RCA as well as moderate ramus/distal LCx disease. 4. Remove right femoral  artery sheath with manual compression two hours after discontinuation of bivalirudin  Intervention    ECHO IMPRESSIONS  1. Left ventricular ejection fraction, by estimation, is 55%. The left  ventricle has normal function. The left ventricle has no regional wall  motion abnormalities. Left ventricular diastolic parameters were normal.  2. Right ventricular systolic function is normal. The right ventricular  size is normal.  3. The mitral valve is normal in structure. No evidence of mitral valve  regurgitation. No evidence of mitral stenosis.  4. The aortic valve is normal in structure. Aortic valve regurgitation is  not visualized. Mild aortic valve sclerosis is present, with no evidence  of aortic valve stenosis.  5. Aortic dilatation noted. There is mild dilatation of the ascending  aorta, measuring 40 mm.  6. The inferior vena cava is normal in size with greater than 50%  respiratory variability, suggesting right atrial pressure of 3 mmHg.    ASSESSMENT:    1. NSTEMI (non-ST elevated myocardial infarction) [facility]): November 2021   2. Hyperlipidemia with target LDL less than 70   3. Type 2 diabetes mellitus with complication, without long-term current use of insulin ([place])   4. History of tobacco use: quit November 2021   5. Medication management     PLAN:  Ms. Michie Molnar is a very pleasant 65 year old female who is originally  from [place].  She has a history of previous significant tobacco use, hypertension, diabetes mellitus, as well as hyperlipidemia.  She had recently developed an acute coronary syndrome 1 week following her redo right shoulder surgery and was found to have thrombotic occlusion of her proximal circumflex.  Catheterization also revealed a chronic total occlusion of RCA which was well collateralized via septal perforators arising from the LAD.  She also had mild concomitant CAD in her LAD, moderate in the ramus intermediate, and in her proximal RCA.  She has not had any recurrent anginal symptomatology and underwent successful DES stenting after thrombectomy of her proximal circumflex.  Her blood pressure today is well controlled and she has been without ischemic symptoms.  Her ECG remained stable and does not show signs of her recent heart attack.  She continues to be on aspirin and Brilinta and should maintain on this for minimum of 1 year.  She is now on atorvastatin.  LDL cholesterol in November was 65 and most recently today was improved at 51.  She is on metformin and Trulicity for her diabetes mellitus.  She continues to be on gabapentin as needed for nerve pain.  She has been undergoing successful physical therapy following her right shoulder surgery.  She was fasting today and laboratory was performed.  She will continue current therapy.  I commended her on her smoking cessation.  I will see her in 3 to 4 months for reevaluation or sooner as needed.   Medication Adjustments/Labs and Tests Ordered: Current medicines are reviewed at length with the patient today.  Concerns regarding medicines are outlined above.  Medication changes, Labs and Tests ordered today are listed in the Patient Instructions below. Patient Instructions  Medication Instructions:  No changes *If you need a refill on your cardiac medications before your next appointment, please call your pharmacy*   Lab Work: CMET, CBC, TSH and  Lipid Profile - FASTING in our office today If you have labs (blood work) drawn today and your tests are completely normal, you will receive your results only by: Marland Kitchen MyChart Message (if you have MyChart) OR . A paper  copy in the mail If you have any lab test that is abnormal or we need to change your treatment, we will call you to review the results.   Testing/Procedures: None ordered   Follow-Up: At [facility], you and your health needs are our priority.  As part of our continuing mission to provide you with exceptional heart care, we have created designated Provider Care Teams.  These Care Teams include your primary Cardiologist (physician) and Advanced Practice Providers (APPs -  Physician Assistants and Nurse Practitioners) who all work together to provide you with the care you need, when you need it.  We recommend signing up for the patient portal called "MyChart".  Sign up information is provided on this After Visit Summary.  MyChart is used to connect with patients for Virtual Visits (Telemedicine).  Patients are able to view lab/test results, encounter notes, upcoming appointments, etc.  Non-urgent messages can be sent to your provider as well.   To learn more about what you can do with MyChart, go to [email or web address].    Your next appointment:   4 month(s)  The format for your next appointment:   In Person  Provider:   Shelva Majestic, MD   Other Instructions None     Signed, Shelva Majestic, MD  03/16/2020 11:53 AM    [facility] [address] [place], [place], [place]  [address] Phone: [phone]

## 2020-03-16 ENCOUNTER — Encounter: Payer: Self-pay | Admitting: Cardiovascular Disease

## 2020-03-16 DIAGNOSIS — H5203 Hypermetropia, bilateral: Secondary | ICD-10-CM | POA: Diagnosis not present

## 2020-03-16 DIAGNOSIS — M25511 Pain in right shoulder: Secondary | ICD-10-CM | POA: Diagnosis not present

## 2020-03-16 DIAGNOSIS — E119 Type 2 diabetes mellitus without complications: Secondary | ICD-10-CM | POA: Diagnosis not present

## 2020-03-20 DIAGNOSIS — M25511 Pain in right shoulder: Secondary | ICD-10-CM | POA: Diagnosis not present

## 2020-03-21 ENCOUNTER — Telehealth: Payer: Self-pay

## 2020-03-21 NOTE — Telephone Encounter (Signed)
I started a Brilinta PA through covermymeds and received the following message: Christina Larsen Key: [id number] Outcome: Available without authorization. Drug: Brilinta 90MG  tablets Form: [employer] 4-Part Electronic PA Form (2017 Ola  I called [facility] and s/w Pam. Per Pam the pt picked her Brilinta up on 1/4 and that her ins paid their part and that a PA was not required.

## 2020-04-03 MED FILL — METFORMIN HCL 1000 MG TABS: 1000 | 90 days supply | Qty: 180 | Fill #0

## 2020-04-03 MED FILL — METOPROLOL TARTRATE 25 MG T: 25 | 90 days supply | Qty: 180 | Fill #0

## 2020-04-04 DIAGNOSIS — Z4789 Encounter for other orthopedic aftercare: Secondary | ICD-10-CM | POA: Diagnosis not present

## 2020-04-04 DIAGNOSIS — G5601 Carpal tunnel syndrome, right upper limb: Secondary | ICD-10-CM | POA: Diagnosis not present

## 2020-04-04 DIAGNOSIS — M25511 Pain in right shoulder: Secondary | ICD-10-CM | POA: Diagnosis not present

## 2020-04-04 DIAGNOSIS — M75111 Incomplete rotator cuff tear or rupture of right shoulder, not specified as traumatic: Secondary | ICD-10-CM | POA: Diagnosis not present

## 2020-04-05 ENCOUNTER — Telehealth (HOSPITAL_COMMUNITY): Payer: Self-pay

## 2020-04-05 NOTE — Telephone Encounter (Signed)
Pt insurance is active and benefits verified through [employer]. Co-pay $15.00, DED $0.00/$0.00 met, out of pocket $3,450.00/$0.00 met, co-insurance 0%. No pre-authorization required. Celeste/HTA, 04/05/20 @ 11:47AM, [phone]

## 2020-04-13 DIAGNOSIS — M25511 Pain in right shoulder: Secondary | ICD-10-CM | POA: Diagnosis not present

## 2020-04-18 ENCOUNTER — Telehealth (HOSPITAL_COMMUNITY): Payer: Self-pay | Admitting: Pharmacist

## 2020-04-18 DIAGNOSIS — M25511 Pain in right shoulder: Secondary | ICD-10-CM | POA: Diagnosis not present

## 2020-04-18 NOTE — Telephone Encounter (Signed)
Cardiac Rehab Medication Review by a Pharmacist  Does the patient  feel that his/her medications are working for him/her?  yes  Has the patient been experiencing any side effects to the medications prescribed?  no  Does the patient measure his/her own blood pressure or blood glucose at home?   Doesn't consistently take these measurements   Does the patient have any problems obtaining medications due to transportation or finances?  Brilinta and Trulicity are both very expensive - next refill in March will be very expensive (>$200/month). Used samples last year when things became really expensive and had to take the medication intermittently. Pt has concerns about affording her medications, rent, and food costs and may really benefit from cheaper therapeutic alternatives. Has medicare insurance coverage.   Understanding of regimen: good Understanding of indications: good Potential of compliance: good   Calton Dach, PharmD PGY1 Pharmacy Resident 04/18/2020 10:52 AM

## 2020-04-24 ENCOUNTER — Other Ambulatory Visit (HOSPITAL_BASED_OUTPATIENT_CLINIC_OR_DEPARTMENT_OTHER): Payer: Self-pay | Admitting: Cardiology

## 2020-04-24 MED FILL — ATORVASTATIN CALCIUM 80 MG: 80 | 90 days supply | Qty: 90 | Fill #0

## 2020-04-24 MED FILL — ASPIRIN ADULT LOW STRENGTH: 81 | 30 days supply | Qty: 30 | Fill #0

## 2020-04-25 DIAGNOSIS — M25511 Pain in right shoulder: Secondary | ICD-10-CM | POA: Diagnosis not present

## 2020-05-02 DIAGNOSIS — M25511 Pain in right shoulder: Secondary | ICD-10-CM | POA: Diagnosis not present

## 2020-05-03 ENCOUNTER — Telehealth (HOSPITAL_COMMUNITY): Payer: Self-pay

## 2020-05-03 ENCOUNTER — Telehealth: Payer: Self-pay | Admitting: Cardiovascular Disease

## 2020-05-03 ENCOUNTER — Encounter (HOSPITAL_COMMUNITY): Payer: Self-pay | Admitting: *Deleted

## 2020-05-03 NOTE — Progress Notes (Signed)
Called pt to give reminder of Cardiac Rehab orientation appointment and to complete health history. During our discussion she states that she has had a sensation of a rubber band around her chest the last 2-3 weeks. States that it has happened 2-3 during this time. It last less than 5 minutes and resolves with no treatment. I ask if this symptom was anything like she felt with her heart attack and she said it was not. She has not let her cardiologist know of this. I instructed her to call and let them know. We did discussed mask, proper shoes,directions to the department and Covid questions. She voices understanding.

## 2020-05-03 NOTE — Telephone Encounter (Signed)
Spoke to patient she stated she has had 3 recent episodes chest tightness.Stated feels like a band around chest.Stated each episode lasted appox 3 min.Appointment scheduled with Edd Fabian NP 05/09/20 at 2:15 pm.

## 2020-05-03 NOTE — Telephone Encounter (Signed)
Per nurse navigator pt will not be able to make her cardiac rehab orientation or sessions. Canceled pt cardiac rehab until further notice.

## 2020-05-03 NOTE — Telephone Encounter (Signed)
Pt c/o of Chest Pain: STAT if CP now or developed within 24 hours  1. Are you having CP right now? *feels like an elastic band inside her chest- she  Was told by Cardiac Rehab  Nurse told her to call  2. Are you experiencing any other symptoms (ex. SOB, nausea, vomiting, sweating)?  no 3. How long have you been experiencing CP? It started about 3 or 4 weeks ago and have had 3 episodes  4. Is your CP continuous or coming and going?  It lasted a ccouple minutes and goes away  5. Have you taken Nitroglycerin?  no ?

## 2020-05-03 NOTE — Progress Notes (Signed)
Noted that pt has a appointment with cardiology group 05/10/19. Pt states that they told her she would still be able to come to CR orientation. There was no documentation to this in their note. Messaged the cardiology office and ask for confirmation that she would be able to do her 6 minute walk test/ orientation. We are awaiting for their response.

## 2020-05-04 ENCOUNTER — Ambulatory Visit (HOSPITAL_COMMUNITY): Payer: PPO

## 2020-05-04 NOTE — Telephone Encounter (Signed)
Nurse reviewed recent note and contacted pt to recommend going to the ER if symptoms reoccur., otherwise keep scheduled appointment on 3/1. Pt verbalized understanding.

## 2020-05-07 ENCOUNTER — Emergency Department (HOSPITAL_BASED_OUTPATIENT_CLINIC_OR_DEPARTMENT_OTHER): Payer: PPO

## 2020-05-07 ENCOUNTER — Emergency Department (HOSPITAL_BASED_OUTPATIENT_CLINIC_OR_DEPARTMENT_OTHER)
Admission: EM | Admit: 2020-05-07 | Discharge: 2020-05-07 | Disposition: A | Discharge status: Home / Self Care | Payer: PPO | Attending: Emergency Medicine | Admitting: Emergency Medicine

## 2020-05-07 ENCOUNTER — Other Ambulatory Visit: Payer: Self-pay

## 2020-05-07 ENCOUNTER — Encounter (HOSPITAL_BASED_OUTPATIENT_CLINIC_OR_DEPARTMENT_OTHER): Payer: Self-pay | Admitting: Pharmacy Technician

## 2020-05-07 DIAGNOSIS — R61 Generalized hyperhidrosis: Secondary | ICD-10-CM | POA: Diagnosis not present

## 2020-05-07 DIAGNOSIS — Z7984 Long term (current) use of oral hypoglycemic drugs: Secondary | ICD-10-CM | POA: Diagnosis not present

## 2020-05-07 DIAGNOSIS — Z7982 Long term (current) use of aspirin: Secondary | ICD-10-CM | POA: Insufficient documentation

## 2020-05-07 DIAGNOSIS — E119 Type 2 diabetes mellitus without complications: Secondary | ICD-10-CM | POA: Diagnosis not present

## 2020-05-07 DIAGNOSIS — Z951 Presence of aortocoronary bypass graft: Secondary | ICD-10-CM | POA: Insufficient documentation

## 2020-05-07 DIAGNOSIS — R110 Nausea: Secondary | ICD-10-CM | POA: Diagnosis not present

## 2020-05-07 DIAGNOSIS — R079 Chest pain, unspecified: Secondary | ICD-10-CM | POA: Diagnosis not present

## 2020-05-07 DIAGNOSIS — R0789 Other chest pain: Secondary | ICD-10-CM | POA: Diagnosis not present

## 2020-05-07 DIAGNOSIS — F17200 Nicotine dependence, unspecified, uncomplicated: Secondary | ICD-10-CM | POA: Insufficient documentation

## 2020-05-07 DIAGNOSIS — Z794 Long term (current) use of insulin: Secondary | ICD-10-CM | POA: Insufficient documentation

## 2020-05-07 DIAGNOSIS — I2510 Atherosclerotic heart disease of native coronary artery without angina pectoris: Secondary | ICD-10-CM | POA: Insufficient documentation

## 2020-05-07 LAB — COMPREHENSIVE METABOLIC PANEL
ALT: 27 U/L (ref 0–44)
AST: 26 U/L (ref 15–41)
Albumin: 4.2 g/dL (ref 3.5–5.0)
Alkaline Phosphatase: 67 U/L (ref 38–126)
Anion gap: 11 (ref 5–15)
BUN: 14 mg/dL (ref 8–23)
CO2: 23 mmol/L (ref 22–32)
Calcium: 9.7 mg/dL (ref 8.9–10.3)
Chloride: 105 mmol/L (ref 98–111)
Creatinine, Ser: 0.61 mg/dL (ref 0.44–1.00)
GFR, Estimated: >60 mL/min (ref >60)
Glucose, Bld: 134 mg/dL — ABNORMAL HIGH (ref 70–99)
Potassium: 3.9 mmol/L (ref 3.5–5.1)
Sodium: 139 mmol/L (ref 135–145)
Total Bilirubin: 0.8 mg/dL (ref 0.3–1.2)
Total Protein: 7.3 g/dL (ref 6.5–8.1)

## 2020-05-07 LAB — CBC WITH DIFFERENTIAL/PLATELET
Abs Immature Granulocytes: 0.04 10*3/uL (ref 0.00–0.07)
Basophils Absolute: 0.1 10*3/uL (ref 0.0–0.1)
Basophils Relative: 1 %
Eosinophils Absolute: 0.2 10*3/uL (ref 0.0–0.5)
Eosinophils Relative: 2 %
HCT: 44.1 % (ref 36.0–46.0)
Hemoglobin: 15 g/dL (ref 12.0–15.0)
Immature Granulocytes: 0 %
Lymphocytes Relative: 27 %
Lymphs Abs: 2.5 10*3/uL (ref 0.7–4.0)
MCH: 32 pg (ref 26.0–34.0)
MCHC: 34 g/dL (ref 30.0–36.0)
MCV: 94 fL (ref 80.0–100.0)
Monocytes Absolute: 0.9 10*3/uL (ref 0.1–1.0)
Monocytes Relative: 9 %
Neutro Abs: 5.6 10*3/uL (ref 1.7–7.7)
Neutrophils Relative %: 61 %
Platelets: 222 10*3/uL (ref 150–400)
RBC: 4.69 MIL/uL (ref 3.87–5.11)
RDW: 13.2 % (ref 11.5–15.5)
WBC: 9.4 10*3/uL (ref 4.0–10.5)
nRBC: 0 % (ref 0.0–0.2)

## 2020-05-07 LAB — LIPASE, BLOOD: Lipase: 38 U/L (ref 11–51)

## 2020-05-07 LAB — TROPONIN I (HIGH SENSITIVITY)
Troponin I (High Sensitivity): 10 ng/L (ref <18)
Troponin I (High Sensitivity): 10 ng/L (ref <18)

## 2020-05-07 NOTE — ED Triage Notes (Signed)
Pt arrives with c/o CP, radiating to right shoulder. Hx of stent in Nov.

## 2020-05-07 NOTE — Discharge Instructions (Addendum)
You can treat your pain with your prescribed nitroglycerin, as directed. Attend your cardiology appointment on Tuesday. Return to the emergency department if you develop worsening or more persisting symptoms.

## 2020-05-07 NOTE — ED Provider Notes (Signed)
[facility] EMERGENCY DEPARTMENT Provider Note   CSN: [id number] Arrival date & time: 05/07/20  1004     History Chief Complaint  Patient presents with  . Chest Pain    Christina Larsen is a 65 y.o. female past medical history of CAD status post NSTEMI in November with stent placed, type 2 diabetes, hyperlipidemia, presenting to the emergency department with complaint of chest pain.  Patient states over the last 2 to 3 weeks she has been having intermittent episodes of a bandlike tightness around her chest.  This morning she began having a new type of chest pain that originates substernal and radiates towards her right shoulder blade, described as a stabbing pain to her right shoulder.  Pain pains are intermittent and occurring at rest.  She states it initially began when she was eating breakfast.  This caused her to feel terribly nauseated and a little bit overheated.  No shortness of breath.  No new leg swelling.  She takes aspirin and Brilinta, has not missed any doses of her Brilinta.  Followed by Dr. Tresa Endo with cardiology.  She states she informed them of her bandlike chest pains and was instructed to come to the ED should anything worsen.  They scheduled her for follow-up appointment for 1 March.  Ports history of cholecystectomy many years ago.  No abdominal pain, cough, shortness of breath, fever, diarrhea.  The history is provided by the patient and medical records.    HPI: A 65 year old patient with a history of treated diabetes and hypercholesterolemia presents for evaluation of chest pain. Initial onset of pain was approximately 1-3 hours ago. The patient's chest pain is described as heaviness/pressure/tightness, is sharp and is not worse with exertion. The patient complains of nausea and reports some diaphoresis. The patient's chest pain is middle- or left-sided, is not well-localized and does not radiate to the arms/jaw/neck. The patient has smoked in the past 90 days. The  patient has no history of stroke, has no history of peripheral artery disease, has no relevant family history of coronary artery disease (first degree relative at less than age 18), is not hypertensive and does not have an elevated BMI (>=30).   Past Medical History:  Diagnosis Date  . Coronary artery disease    11/9 NSTEMI, PCI to pLCx with aspiration thrombectomy/DESx1, occluded mRCA with left to right collaterals  . Diabetes mellitus without complication (HCC)   . HLD (hyperlipidemia)     Patient Active Problem List   Diagnosis Date Noted  . NSTEMI (non-ST elevated myocardial infarction) (HCC) 01/17/2020  . Type 2 diabetes mellitus (HCC) 01/17/2020  . Hyperlipidemia 01/17/2020  . Current smoker 01/17/2020    Past Surgical History:  Procedure Laterality Date  . CHOLECYSTECTOMY  1992  . CORONARY STENT INTERVENTION N/A 01/17/2020   Procedure: CORONARY STENT INTERVENTION;  Surgeon: Yvonne Kendall, MD;  Location: [facility];  Service: Cardiovascular;  Laterality: N/A;  . LAMINECTOMY  2006  . LEFT HEART CATH AND CORONARY ANGIOGRAPHY N/A 01/17/2020   Procedure: LEFT HEART CATH AND CORONARY ANGIOGRAPHY;  Surgeon: Yvonne Kendall, MD;  Location: [facility];  Service: Cardiovascular;  Laterality: N/A;  . rotator cuff surgery       OB History   No obstetric history on file.     Family History  Problem Relation Age of Onset  . Diabetes Mother   . Colon cancer Mother   . Prostate cancer Father   . Hypercholesterolemia Father   . Hypertension Brother  Social History   Tobacco Use  . Smoking status: Current Some Day Smoker    Last attempt to quit: 2015    Years since quitting: 7.1  . Smokeless tobacco: Never Used  Substance Use Topics  . Alcohol use: Yes    Alcohol/week: 4.0 standard drinks    Types: 4 Glasses of wine per week  . Drug use: Never    Home Medications Prior to Admission medications   Medication Sig Start Date End Date Taking?  Authorizing Provider  aspirin 81 MG EC tablet Take 1 tablet (81 mg total) by mouth daily. Swallow whole. 01/20/20   Arty Baumgartneroberts, Lindsay B, NP  atorvastatin (LIPITOR) 80 MG tablet Take 1 tablet (80 mg total) by mouth daily. 03/14/20   Lennette BihariKelly, Thomas A, MD  gabapentin (NEURONTIN) 300 MG capsule Take 300 mg by mouth as needed (for nerve pain).    [provider]  metFORMIN (GLUCOPHAGE) 1000 MG tablet Take 1,000 mg by mouth 2 (two) times daily with a meal. 03/27/18   [provider]  metoprolol tartrate (LOPRESSOR) 25 MG tablet Take 1 tablet (25 mg total) by mouth 2 (two) times daily. 03/14/20   Lennette BihariKelly, Thomas A, MD  Multiple Vitamin (MULTIVITAMIN) tablet Take 1 tablet by mouth daily.    [provider]  nitroGLYCERIN (NITROSTAT) 0.4 MG SL tablet Place 1 tablet (0.4 mg total) under the tongue every 5 (five) minutes as needed. 03/14/20   Lennette BihariKelly, Thomas A, MD  ticagrelor (BRILINTA) 90 MG TABS tablet Take 1 tablet (90 mg total) by mouth 2 (two) times daily. 03/14/20   Lennette BihariKelly, Thomas A, MD  TRULICITY 1.5 MG/0.5ML SOPN Inject 1.5 mg into the muscle every Sunday.  03/28/18   [provider]    Allergies    Hydrocodone-acetaminophen  Review of Systems   Review of Systems  Constitutional: Positive for diaphoresis. Negative for fever.  Respiratory: Negative for cough and shortness of breath.   Cardiovascular: Positive for chest pain. Negative for leg swelling.  Gastrointestinal: Positive for nausea. Negative for abdominal pain.  All other systems reviewed and are negative.   Physical Exam Updated Vital Signs BP 126/84   Pulse 81   Temp 98.7 F (37.1 C) (Oral)   Resp (!) 21   Ht 5\' 5"  (1.651 m)   Wt 68 kg   SpO2 96%   BMI 24.96 kg/m   Physical Exam Vitals and nursing note reviewed.  Constitutional:      General: She is not in acute distress.    Appearance: She is well-developed and well-nourished.  HENT:     Head: Normocephalic and atraumatic.  Eyes:      Conjunctiva/sclera: Conjunctivae normal.  Cardiovascular:     Rate and Rhythm: Normal rate and regular rhythm.     Pulses: Normal pulses.  Pulmonary:     Effort: Pulmonary effort is normal. No respiratory distress.     Breath sounds: Normal breath sounds.  Abdominal:     General: Bowel sounds are normal.     Palpations: Abdomen is soft.     Tenderness: There is no abdominal tenderness. There is no guarding or rebound.  Musculoskeletal:     Right lower leg: No edema.     Left lower leg: No edema.  Skin:    General: Skin is warm.  Neurological:     Mental Status: She is alert.  Psychiatric:        Mood and Affect: Mood and affect normal.  Behavior: Behavior normal.     ED Results / Procedures / Treatments   Labs (all labs ordered are listed, but only abnormal results are displayed) Labs Reviewed  COMPREHENSIVE METABOLIC PANEL - Abnormal; Notable for the following components:      Result Value   Glucose, Bld 134 (*)    All other components within normal limits  LIPASE, BLOOD  CBC WITH DIFFERENTIAL/PLATELET  TROPONIN I (HIGH SENSITIVITY)  TROPONIN I (HIGH SENSITIVITY)    EKG EKG Interpretation  Date/Time:  Sunday May 07 2020 10:03:14 EST Ventricular Rate:  73 PR Interval:  156 QRS Duration: 96 QT Interval:  378 QTC Calculation: 416 R Axis:   -29 Text Interpretation: Normal sinus rhythm Low voltage QRS Borderline ECG No significant change since last tracing Confirmed by Susy Frizzle [phone]) on 05/07/2020 10:24:46 AM   Radiology DG Chest 2 View  Result Date: 05/07/2020 CLINICAL DATA:  Chest pain EXAM: CHEST - 2 VIEW COMPARISON:  January 16, 2020 FINDINGS: The cardiomediastinal silhouette is unchanged in contour. No pleural effusion. No pneumothorax. No acute pleuroparenchymal abnormality. Visualized abdomen is unremarkable. No acute osseous abnormality noted. IMPRESSION: No acute cardiopulmonary abnormality. Electronically Signed   By: Meda Klinefelter  MD   On: 05/07/2020 11:11    Procedures Procedures   Medications Ordered in ED Medications - No data to display  ED Course  I have reviewed the triage vital signs and the nursing notes.  Pertinent labs & imaging results that were available during my care of the patient were reviewed by me and considered in my medical decision making (see chart for details).  Clinical Course as of 05/07/20 1414  Sun May 07, 2020  1230 Second troponin is unchanged.  Discussed cardiology recommendations for treatment with nitroglycerin at home and close follow-up in the first.  Patient verbalized understanding agrees with care plan. [JR]  1232 Consulted with cardiology, Dr. Izora Ribas. Agrees with plan for repeat trop. Treat with NTG. Outpatient follow up. If positive trop, admit. [JR]    Clinical Course User Index [JR] Robinson, [place] N, PA-C   MDM Rules/Calculators/A&P HEAR Score: 5                        Patient with history of CAD status post NSTEMI and PCI in November 2021, presenting with chest pain.  She is having 2 to 3 weeks of bandlike tightness across her chest that lasts a brief duration.  Today she woke up with intermittent sharp stabbing pains that began substernal and radiate to the right shoulder blade.  Symptoms are also brief in nature.  She has low risk Wells criteria, low suspicion for PE.  Her troponins x2 are negative.  ECG is nonischemic.  Chest x-ray is clear.  Consulted with cardiology who recommends she is appropriate for outpatient follow-up, for which she has appointment on March 1.  He recommend she treat her symptoms with nitroglycerin for relief.  Does not believe she requires admission at this time.  I believe this is reasonable as well, symptoms are atypical and work-up is reassuring.  Discussed these recommendations with patient, she verbalized understanding agrees with care plan.  She is discussed strict return precautions including persistent worsening pain, or other  concerning symptoms.  Discussed results, findings, treatment and follow up. Patient advised of return precautions. Patient verbalized understanding and agreed with plan.  Care plan and  work-up discussed with attending physician Dr. Bernette Mayers, who is in agreement Final Clinical Impression(s) / ED  Diagnoses Final diagnoses:  Other chest pain    Rx / DC Orders ED Discharge Orders    None       Robinson, [place] N, PA-C 05/07/20 1414    Pollyann Savoy, MD 05/08/20 1110

## 2020-05-07 NOTE — ED Triage Notes (Signed)
Pt arrives with c/o CP, radiating to right shoulder. Hx of stent in Nov. 

## 2020-05-07 NOTE — ED Notes (Signed)
Triage completed by RN Augusto Gamble, documentation corrected.

## 2020-05-08 ENCOUNTER — Ambulatory Visit (HOSPITAL_COMMUNITY): Payer: PPO

## 2020-05-08 NOTE — Progress Notes (Signed)
Cardiology Clinic Note   Patient Name: Christina Larsen Date of Encounter: 05/09/2020  Primary Care Provider:  Geoffry ParadiseAronson, Richard, MD Primary Cardiologist:  Nicki Guadalajarahomas Kelly, MD  Patient Profile    Christina Larsen 65 year old female presents the clinic today for evaluation of her chest tightness.  Past Medical History    Past Medical History:  Diagnosis Date  . Coronary artery disease    11/9 NSTEMI, PCI to pLCx with aspiration thrombectomy/DESx1, occluded mRCA with left to right collaterals  . Diabetes mellitus without complication (HCC)   . HLD (hyperlipidemia)    Past Surgical History:  Procedure Laterality Date  . CHOLECYSTECTOMY  1992  . CORONARY STENT INTERVENTION N/A 01/17/2020   Procedure: CORONARY STENT INTERVENTION;  Surgeon: Yvonne KendallEnd, Christopher, MD;  Location: [facility];  Service: Cardiovascular;  Laterality: N/A;  . LAMINECTOMY  2006  . LEFT HEART CATH AND CORONARY ANGIOGRAPHY N/A 01/17/2020   Procedure: LEFT HEART CATH AND CORONARY ANGIOGRAPHY;  Surgeon: Yvonne KendallEnd, Christopher, MD;  Location: [facility];  Service: Cardiovascular;  Laterality: N/A;  . rotator cuff surgery      Allergies  Allergies  Allergen Reactions  . Hydrocodone-Acetaminophen     Pt gets sick- vomiting    History of Present Illness    Christina Larsen has a PMH of type 2 diabetes, hyperlipidemia, tobacco abuse, and NSTEMI.  She underwent cardiac catheterization 01/17/2020.  She had a successful PCI to proximal circumflex with aspiration thrombectomy and placement of DES x1.  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  She presented to the emergency department 05/07/2020 with complaints of intermittent episodes of chest tightness over  the last 2-3 weeks.  She reported that the morning of her presentation she had a new type of chest pain that originated substernally and radiated toward her right shoulder blade.  She described it as a stabbing pain to her right shoulder.  Her pain was intermittent and occurred at rest.  The pain initially began while she was eating breakfast.  It caused her to become nauseated and overheated.  She denied shortness of breath, lower extremity swelling.  She reported compliance with her aspirin and Brilinta and had not missed any doses of her Brilinta.  Her blood pressure was 126/84 with a pulse of 81.  EKG showed normal sinus rhythm 73 bpm.  Her high-sensitivity troponins were unchanged.  Chest x-ray showed no acute abnormalities it was felt that her chest pain was atypical and she was asked to follow-up with cardiology.  She was asked to treat her symptoms with nitroglycerin.  She presents the clinic today for follow-up evaluation states she has been slowly increasing her physical activity in cardiac rehab and with her right shoulder rehab.  She had right rotator cuff repair June 2021 and November 2021 after retiring rotator cuff.  We reviewed her exercises and her chest discomfort is reproducible during deep palpation on physical exam.  I explained that this is not related to cardiac issues and is musculoskeletal in nature.  We discussed increasing her physical activity slowly, continuing her current medications, following heart healthy diet and following up with Dr. Tresa EndoKelly as scheduled.  Today she denies chest pain, shortness of breath, lower extremity edema, increased fatigue, palpitations,  diaphoresis, weakness, presyncope, syncope, orthopnea, and PND.  Home Medications    Prior to Admission medications   Medication Sig Start Date End Date Taking? Authorizing Provider  aspirin 81 MG EC tablet Take 1 tablet (81 mg total) by mouth daily. Swallow whole. 01/20/20   Arty Baumgartner, NP  atorvastatin  (LIPITOR) 80 MG tablet Take 1 tablet (80 mg total) by mouth daily. 03/14/20   [name], MD  gabapentin (NEURONTIN) 300 MG capsule Take 300 mg by mouth as needed (for nerve pain).    [provider]  metFORMIN (GLUCOPHAGE) 1000 MG tablet Take 1,000 mg by mouth 2 (two) times daily with a meal. 03/27/18   [provider]  metoprolol tartrate (LOPRESSOR) 25 MG tablet Take 1 tablet (25 mg total) by mouth 2 (two) times daily. 03/14/20   [name], MD  Multiple Vitamin (MULTIVITAMIN) tablet Take 1 tablet by mouth daily.    [provider]  nitroGLYCERIN (NITROSTAT) 0.4 MG SL tablet Place 1 tablet (0.4 mg total) under the tongue every 5 (five) minutes as needed. 03/14/20   [name], MD  ticagrelor (BRILINTA) 90 MG TABS tablet Take 1 tablet (90 mg total) by mouth 2 (two) times daily. 03/14/20   [name], MD  TRULICITY 1.5 MG/0.5ML SOPN Inject 1.5 mg into the muscle every Sunday.  03/28/18   [provider]    Family History    Family History  Problem Relation Age of Onset  . Diabetes Mother   . Colon cancer Mother   . Prostate cancer Father   . Hypercholesterolemia Father   . Hypertension Brother    She indicated that her mother is deceased. She indicated that her father is deceased. She indicated that her sister is alive. She indicated that her brother is alive.  Social History    Social History   Socioeconomic History  . Marital status: Widowed    Spouse name: Not on file  . Number of children: Not on file  . Years of education: Not on file  . Highest education level: Not on file  Occupational History  . Not on file  Tobacco Use  . Smoking status: Current Some Day Smoker    Last attempt to quit: 2015    Years since quitting: 7.1  . Smokeless tobacco: Never Used  Substance and Sexual Activity  . Alcohol use: Yes    Alcohol/week: 4.0 standard drinks    Types: 4 Glasses of wine per week  . Drug use: Never  . Sexual activity: Not  on file  Other Topics Concern  . Not on file  Social History Narrative  . Not on file   Social Determinants of Health   Financial Resource Strain: Not on file  Food Insecurity: Not on file  Transportation Needs: Not on file  Physical Activity: Not on file  Stress: Not on file  Social Connections: Not on file  Intimate Partner Violence: Not on file     Review of Systems    General:  No chills, fever, night sweats or weight changes.  Cardiovascular:  No chest pain, dyspnea on exertion, edema, orthopnea, palpitations, paroxysmal nocturnal dyspnea. Dermatological: No rash, lesions/masses Respiratory: No cough, dyspnea Urologic: No hematuria, dysuria Abdominal:   No nausea, vomiting, diarrhea, bright red blood per rectum, melena, or hematemesis Neurologic:  No visual changes, wkns, changes in mental status. All other systems reviewed and are otherwise negative except as noted above.  Physical Exam    VS:  BP (!) 142/70 (BP Location: Left Arm,  Patient Position: Sitting, Cuff Size: Normal)   Pulse 91   Ht 5\' 5"  (1.651 m)   Wt 156 lb (70.8 kg)   BMI 25.96 kg/m  , BMI Body mass index is 25.96 kg/m. GEN: Well nourished, well developed, in no acute distress. HEENT: normal. Neck: Supple, no JVD, carotid bruits, or masses. Cardiac: RRR, no murmurs, rubs, or gallops. No clubbing, cyanosis, edema.  Radials/DP/PT 2+ and equal bilaterally.  Respiratory:  Respirations regular and unlabored, clear to auscultation bilaterally. GI: Soft, nontender, nondistended, BS + x 4. MS: no deformity or atrophy. Skin: warm and dry, no rash. Neuro:  Strength and sensation are intact. Psych: Normal affect.  Accessory Clinical Findings    Recent Labs: 01/17/2020: Magnesium 2.0 03/15/2020: TSH 0.535 05/07/2020: ALT 27; BUN 14; Creatinine, Ser 0.61; Hemoglobin 15.0; Platelets 222; Potassium 3.9; Sodium 139   Recent Lipid Panel    Component Value Date/Time   CHOL 117 03/15/2020 0812   TRIG 130  03/15/2020 0812   HDL 42 03/15/2020 0812   CHOLHDL 2.8 03/15/2020 0812   CHOLHDL 4.2 01/17/2020 1206   VLDL 37 01/17/2020 1206   LDLCALC 52 03/15/2020 0812    ECG personally reviewed by me today-normal sinus rhythm no ST or T wave deviation 91 bpm- No acute changes  Cardiac catheterization 01/17/2020 Conclusions: 1. Significant two-vessel coronary artery disease with occlusions of the proximal LCx (acute with heavy thrombus burden) and mid RCA (chronic with left-to-right collaterals).  Moderate, non-obstructive coronary artery disease is also present in the ramus intermedius and distal LCx (codominant vessel). 2. Mid LAD myocardial bridge. 3. Mildly elevated left ventricular filling pressure with lateral wall hypokinesis and mildly reduced contraction. 4. Successful PCI to proximal LCx with aspiration thrombectomy and placement of Resolute Onyx 3.0 x 18 mm drug-eluting stent (postdilated to 3.3 mm) with 0% residual stenosis and TIMI-3 flow.  Recommendations: 1. Dual antiplatelet therapy with aspirin and ticagrelor for at least 12 months. 2. Aggressive secondary prevention, including high-intensity statin therapy and smoking cessation. 3. Medical management of chronic total occlusion of mid RCA as well as moderate ramus/distal LCx disease. 4. Remove right femoral artery sheath with manual compression two hours after discontinuation of bivalirudin.  13/10/2019, MD  Diagnostic Dominance: Co-dominant    Intervention       Assessment & Plan   1.  Atypical/MSK  chest pain-MSK pain reproduced with deep palpation..  Presented to the emergency department 05/07/2020 with complaints of chest discomfort that have been present over the last 2-3 weeks.  She reported that the morning of her presentation she had a new type of chest pain that originated substernally and radiated toward her right shoulder blade.  She described it as a stabbing pain to her right shoulder.  It was felt to be  atypical and work-up was unremarkable.  Does not appear to be cardiac in nature. Continue physical therapy Heart healthy low-sodium diet-salty 6 given Increase physical activity as tolerated Continue to monitor  Coronary artery disease-underwent cardiac catheterization with DES and PCI 11/21. Continue aspirin, atorvastatin, metoprolol, Brilinta Heart healthy low-sodium diet-salty 6 given Increase physical activity as tolerated   Hyperlipidemia-01/17/2020: VLDL 37 03/15/2020: Cholesterol, Total 117; HDL 42; LDL Chol Calc (NIH) 52; Triglycerides 130 Continue atorvastatin Heart healthy low-sodium high-fiber diet Increase physical activity as tolerated  Disposition: Follow-up with Dr. 05/13/2020 in May as scheduled.  June. Franklin Clapsaddle NP-C    05/09/2020, 2:54 PM [facility] [address] Office [phone] Fax [phone])  [phone]  Notice: This dictation was prepared with Dragon dictation along with smaller phrase technology. Any transcriptional errors that result from this process are unintentional and may not be corrected upon review.  I spent 15 minutes examining this patient, reviewing medications, and using patient centered shared decision making involving her cardiac care.  Prior to her visit I spent greater than 20 minutes reviewing her past medical history,  medications, and prior cardiac tests.

## 2020-05-09 ENCOUNTER — Other Ambulatory Visit: Payer: Self-pay | Admitting: General Practice

## 2020-05-09 ENCOUNTER — Encounter: Payer: Self-pay | Admitting: General Practice

## 2020-05-09 ENCOUNTER — Other Ambulatory Visit: Payer: Self-pay

## 2020-05-09 ENCOUNTER — Telehealth: Payer: Self-pay | Admitting: Cardiovascular Disease

## 2020-05-09 ENCOUNTER — Ambulatory Visit: Payer: PPO | Admitting: General Practice

## 2020-05-09 VITALS — BP 142/70 | HR 91 | Ht 65.0 in | Wt 156.0 lb

## 2020-05-09 DIAGNOSIS — E785 Hyperlipidemia, unspecified: Secondary | ICD-10-CM | POA: Diagnosis not present

## 2020-05-09 DIAGNOSIS — I2510 Atherosclerotic heart disease of native coronary artery without angina pectoris: Secondary | ICD-10-CM

## 2020-05-09 DIAGNOSIS — R0789 Other chest pain: Secondary | ICD-10-CM

## 2020-05-09 MED ORDER — TICAGRELOR 90 MG PO TABS: 90.0000 mg | ORAL_TABLET | Freq: Two times a day (BID) | ORAL | 3 refills | Status: DC

## 2020-05-09 NOTE — Telephone Encounter (Signed)
[facility] received paperwork via fax from [facility], patient has appt on 3/1 with Edd Fabian, NP. Paperwork was given to nurse for Edd Fabian before patient appt on 3/1.

## 2020-05-09 NOTE — Addendum Note (Signed)
Addended by: Alyson Ingles on: 05/09/2020 04:37 PM   Modules accepted: Orders

## 2020-05-09 NOTE — Telephone Encounter (Signed)
Patient has seen Dr.Kelly previosuly , paperwork was given to [facility] on 3/1

## 2020-05-09 NOTE — Patient Instructions (Signed)
Medication Instructions:  The current medical regimen is effective;  continue present plan and medications as directed. Please refer to the Current Medication list given to you today.  *If you need a refill on your cardiac medications before your next appointment, please call your pharmacy*  Lab Work:   Testing/Procedures:  NONE    NONE  Special Instructions PLEASE READ AND FOLLOW SALTY 6-ATTACHED-1,800mg  daily  PLEASE INCREASE PHYSICAL ACTIVITY AS TOLERATED  Follow-Up: Your next appointment:  IN MAY 2022  In Person with [name], MD OR IF UNAVAILABLE JESSE CLEAVER, FNP-C  At [facility], you and your health needs are our priority.  As part of our continuing mission to provide you with exceptional heart care, we have created designated Provider Care Teams.  These Care Teams include your primary Cardiologist (physician) and Advanced Practice Providers (APPs -  Physician Assistants and Nurse Practitioners) who all work together to provide you with the care you need, when you need it.            6 SALTY THINGS TO AVOID     1,800MG  DAILY

## 2020-05-10 ENCOUNTER — Telehealth (HOSPITAL_COMMUNITY): Payer: Self-pay | Admitting: *Deleted

## 2020-05-10 ENCOUNTER — Ambulatory Visit (HOSPITAL_COMMUNITY): Payer: PPO

## 2020-05-10 NOTE — Telephone Encounter (Signed)
-----   Message from Ronney Asters, NP sent at 05/10/2020 11:13 AM EST ----- Hello Carlette,   Thank you for checking in.  Yes she may proceed with cardiac rehab at this time.  [name]. Cleaver NP-C 05/10/2020, 11:13 AM [facility] [address] Office [phone] Fax [phone]   ----- Message ----- From: Chelsea Aus, RN Sent: 05/10/2020   9:48 AM EST To: Ronney Asters, NP  Verdon Cummins,  Wanted to clarify with you the above patient readiness to proceed with rescheduling Cardiac Rehab.  Note that she has not started CR.  Was scheduled for last week, placed on hold due to complaint of "tight band around her chest.  Dennie Bible had a brief ER visit for "chest pain" and followed up with you on yesterday. There is a mention of physical therapy for her shoulder just wanted to make sure she was appropriate to move forward with scheduling her orientation appt.  Thanks so much Pharmacist, hospital, BSN Cardiac and Emergency planning/management officer

## 2020-05-11 ENCOUNTER — Encounter (HOSPITAL_COMMUNITY): Payer: Self-pay

## 2020-05-11 ENCOUNTER — Other Ambulatory Visit: Payer: Self-pay

## 2020-05-11 ENCOUNTER — Encounter (HOSPITAL_COMMUNITY)
Admission: RE | Admit: 2020-05-11 | Discharge: 2020-05-11 | Disposition: A | Discharge status: Home / Self Care | Payer: PPO | Source: Ambulatory Visit | Attending: Cardiovascular Disease | Admitting: Cardiovascular Disease

## 2020-05-11 VITALS — BP 94/60 | HR 82 | Ht 65.75 in | Wt 153.0 lb

## 2020-05-11 DIAGNOSIS — I241 Dressler's syndrome: Secondary | ICD-10-CM | POA: Insufficient documentation

## 2020-05-11 DIAGNOSIS — I214 Non-ST elevation (NSTEMI) myocardial infarction: Secondary | ICD-10-CM

## 2020-05-11 DIAGNOSIS — Z955 Presence of coronary angioplasty implant and graft: Secondary | ICD-10-CM | POA: Insufficient documentation

## 2020-05-11 NOTE — Progress Notes (Signed)
Cardiac Individual Treatment Plan  Patient Details  Name: Christina Larsen MRN: [id number] Date of Birth: [birth year] Referring Provider:   Flowsheet Row CARDIAC REHAB PHASE II ORIENTATION from 05/11/2020 in [facility] CARDIAC REHAB  Referring Provider [name], MD      Initial Encounter Date:  Flowsheet Row CARDIAC REHAB PHASE II ORIENTATION from 05/11/2020 in [facility] CARDIAC REHAB  Date 05/11/20      Visit Diagnosis: 01/16/20 NSTEMI   01/17/20 S/P DES CFx  Patient's Home Medications on Admission:  Current Outpatient Medications:  .  aspirin 81 MG EC tablet, Take 1 tablet (81 mg total) by mouth daily. Swallow whole., Disp: 90 tablet, Rfl: 3 .  atorvastatin (LIPITOR) 80 MG tablet, Take 1 tablet (80 mg total) by mouth daily., Disp: 90 tablet, Rfl: 3 .  gabapentin (NEURONTIN) 300 MG capsule, Take 300 mg by mouth as needed (for nerve pain)., Disp: , Rfl:  .  metFORMIN (GLUCOPHAGE) 1000 MG tablet, Take 1,000 mg by mouth 2 (two) times daily with a meal., Disp: , Rfl:  .  metoprolol tartrate (LOPRESSOR) 25 MG tablet, Take 1 tablet (25 mg total) by mouth 2 (two) times daily., Disp: 180 tablet, Rfl: 3 .  Multiple Vitamin (MULTIVITAMIN) tablet, Take 1 tablet by mouth daily., Disp: , Rfl:  .  nitroGLYCERIN (NITROSTAT) 0.4 MG SL tablet, Place 1 tablet (0.4 mg total) under the tongue every 5 (five) minutes as needed., Disp: 25 tablet, Rfl: 2 .  ticagrelor (BRILINTA) 90 MG TABS tablet, Take 1 tablet (90 mg total) by mouth 2 (two) times daily., Disp: 180 tablet, Rfl: 3 .  TRULICITY 1.5 MG/0.5ML SOPN, Inject 1.5 mg into the muscle every Sunday. , Disp: , Rfl:   Past Medical History: Past Medical History:  Diagnosis Date  . Coronary artery disease    11/9 NSTEMI, PCI to pLCx with aspiration thrombectomy/DESx1, occluded mRCA with left to right collaterals  . Diabetes mellitus without complication (HCC)   . HLD (hyperlipidemia)     Tobacco Use: Social History    Tobacco Use  Smoking Status Former Smoker  . Types: Cigarettes  . Quit date: 01/16/2020  . Years since quitting: 0.3  Smokeless Tobacco Never Used    Labs: Recent Review Advice worker    Labs for ITP Cardiac and Pulmonary Rehab Latest Ref Rng & Units 01/17/2020 03/15/2020   Cholestrol 100 - 199 mg/dL 924 268   LDLCALC 0 - 99 mg/dL 81 52   HDL >34 mg/dL 19(Q) 42   Trlycerides 0 - 149 mg/dL 222(L) 798   Hemoglobin A1c 4.8 - 5.6 % 7.3(H) -      Capillary Blood Glucose: Lab Results  Component Value Date   GLUCAP 141 (H) 01/19/2020   GLUCAP 140 (H) 01/19/2020   GLUCAP 154 (H) 01/18/2020   GLUCAP 136 (H) 01/18/2020   GLUCAP 233 (H) 01/18/2020     Exercise Target Goals: Exercise Program Goal: Individual exercise prescription set using results from initial 6 min walk test and THRR while considering  patient's activity barriers and safety.   Exercise Prescription Goal: Starting with aerobic activity 30 plus minutes a day, 3 days per week for initial exercise prescription. Provide home exercise prescription and guidelines that participant acknowledges understanding prior to discharge.  Activity Barriers & Risk Stratification:  Activity Barriers & Cardiac Risk Stratification - 05/11/20 1028      Activity Barriers & Cardiac Risk Stratification   Activity Barriers Joint Problems;Deconditioning;Muscular Weakness;Other (comment)    Comments  S/P surgical repair of right rotator cuff x 2    Cardiac Risk Stratification High           6 Minute Walk:  6 Minute Walk    Row Name 05/11/20 0833         6 Minute Walk   Distance 1373 feet     Walk Time 6 minutes     # of Rest Breaks 0     MPH 2.6     METS 3.29     RPE 12     Perceived Dyspnea  1     VO2 Peak 11.52     Symptoms Yes (comment)     Comments SOB, RPD = 1     Resting HR 81 bpm     Resting BP 94/60     Resting Oxygen Saturation  96 %     Exercise Oxygen Saturation  during 6 min walk 98 %     Max Ex. HR 93 bpm      Max Ex. BP 130/80     2 Minute Post BP 114/72            Oxygen Initial Assessment:   Oxygen Re-Evaluation:   Oxygen Discharge (Final Oxygen Re-Evaluation):   Initial Exercise Prescription:  Initial Exercise Prescription - 05/11/20 1000      Date of Initial Exercise RX and Referring Provider   Date 05/11/20    Referring Provider [name], MD    Expected Discharge Date 07/08/19      Bike   Level 1.5    Minutes 15    METs 2      Track   Laps 13    Minutes 15    METs 2.51      Prescription Details   Frequency (times per week) 3    Duration Progress to 30 minutes of continuous aerobic without signs/symptoms of physical distress      Intensity   THRR 40-80% of Max Heartrate 62-123    Ratings of Perceived Exertion 11-13    Perceived Dyspnea 0-4      Progression   Progression Continue progressive overload as per policy without signs/symptoms or physical distress.      Resistance Training   Training Prescription Yes   left arm only   Weight 2 lbs    Reps 10-15           Perform Capillary Blood Glucose checks as needed.  Exercise Prescription Changes:   Exercise Comments:   Exercise Goals and Review:  Exercise Goals    Row Name 05/11/20 1030             Exercise Goals   Increase Physical Activity Yes       Intervention Provide advice, education, support and counseling about physical activity/exercise needs.;Develop an individualized exercise prescription for aerobic and resistive training based on initial evaluation findings, risk stratification, comorbidities and participant's personal goals.       Expected Outcomes Short Term: Attend rehab on a regular basis to increase amount of physical activity.;Long Term: Add in home exercise to make exercise part of routine and to increase amount of physical activity.;Long Term: Exercising regularly at least 3-5 days a week.       Increase Strength and Stamina Yes       Intervention Provide advice,  education, support and counseling about physical activity/exercise needs.;Develop an individualized exercise prescription for aerobic and resistive training based on initial evaluation findings, risk stratification, comorbidities and participant's personal goals.  Expected Outcomes Long Term: Improve cardiorespiratory fitness, muscular endurance and strength as measured by increased METs and functional capacity ( );Short Term: Perform resistance training exercises routinely during rehab and add in resistance training at home;Short Term: Increase workloads from initial exercise prescription for resistance, speed, and METs.       Able to understand and use rate of perceived exertion (RPE) scale Yes       Intervention Provide education and explanation on how to use RPE scale       Expected Outcomes Short Term: Able to use RPE daily in rehab to express subjective intensity level;Long Term:  Able to use RPE to guide intensity level when exercising independently       Knowledge and understanding of Target Heart Rate Range (THRR) Yes       Intervention Provide education and explanation of THRR including how the numbers were predicted and where they are located for reference       Expected Outcomes Short Term: Able to state/look up THRR;Short Term: Able to use daily as guideline for intensity in rehab;Long Term: Able to use THRR to govern intensity when exercising independently       Understanding of Exercise Prescription Yes       Intervention Provide education, explanation, and written materials on patient's individual exercise prescription       Expected Outcomes Short Term: Able to explain program exercise prescription;Long Term: Able to explain home exercise prescription to exercise independently              Exercise Goals Re-Evaluation :    Discharge Exercise Prescription (Final Exercise Prescription Changes):   Nutrition:  Target Goals: Understanding of nutrition guidelines, daily  intake of sodium 1500mg , cholesterol 200mg , calories 30% from fat and 7% or less from saturated fats, daily to have 5 or more servings of fruits and vegetables.  Biometrics:  Pre Biometrics - 05/11/20 1021      Pre Biometrics   Waist Circumference 38.25 inches    Hip Circumference 41.25 inches    Waist to Hip Ratio 0.93 %    Triceps Skinfold 29 mm    % Body Fat 38.6 %    Grip Strength 23 kg    Flexibility 14.75 in    Single Leg Stand 30 seconds            Nutrition Therapy Plan and Nutrition Goals:   Nutrition Assessments:  MEDIFICTS Score Key:  ?70 Need to make dietary changes   40-70 Heart Healthy Diet  ? 40 Therapeutic Level Cholesterol Diet   Picture Your Plate Scores:  <16 Unhealthy dietary pattern with much room for improvement.  41-50 Dietary pattern unlikely to meet recommendations for good health and room for improvement.  51-60 More healthful dietary pattern, with some room for improvement.   >60 Healthy dietary pattern, although there may be some specific behaviors that could be improved.    Nutrition Goals Re-Evaluation:   Nutrition Goals Discharge (Final Nutrition Goals Re-Evaluation):   Psychosocial: Target Goals: Acknowledge presence or absence of significant depression and/or stress, maximize coping skills, provide positive support system. Participant is able to verbalize types and ability to use techniques and skills needed for reducing stress and depression.  Initial Review & Psychosocial Screening:  Initial Psych Review & Screening - 05/11/20 1155      Initial Review   Current issues with Current Stress Concerns    Source of Stress Concerns Chronic Illness;Unable to perform yard/household activities;Occupation;Retirement/disability;Financial;Unable to participate in former interests or hobbies  Comments Pat had Shoulder surgery and then a MI and stent right after. Dennie Bible says she is not depressed but has been through a lot emotionally.       Family Dynamics   Good Support System? Yes   Pat lives alone. Dennie Bible has a son who lives in [place] and a neighbor and a friend who lives in [place] for support.   Comments Dennie Bible recently relocated to [facility] from [place] for work. Dennie Bible admits to feeling isolated during the pandemic      Barriers   Psychosocial barriers to participate in program The patient should benefit from training in stress management and relaxation.      Screening Interventions   Interventions To provide support and resources with identified psychosocial needs    Expected Outcomes Long Term Goal: Stressors or current issues are controlled or eliminated.;Short Term goal: Utilizing psychosocial counselor, staff and physician to assist with identification of specific Stressors or current issues interfering with healing process. Setting desired goal for each stressor or current issue identified.;Short Term goal: Identification and review with participant of any Quality of Life or Depression concerns found by scoring the questionnaire.;Long Term goal: The participant improves quality of Life and PHQ9 Scores as seen by post scores and/or verbalization of changes           Quality of Life Scores:  Quality of Life - 05/11/20 1018      Quality of Life   Select Quality of Life      Quality of Life Scores   Health/Function Pre 19.14 %    Socioeconomic Pre 16.75 %    Psych/Spiritual Pre 18 %    Family Pre 25.2 %    GLOBAL Pre 19.39 %          Scores of 19 and below usually indicate a poorer quality of life in these areas.  A difference of  2-3 points is a clinically meaningful difference.  A difference of 2-3 points in the total score of the Quality of Life Index has been associated with significant improvement in overall quality of life, self-image, physical symptoms, and general health in studies assessing change in quality of life.  PHQ-9: Recent Review Flowsheet Data    Depression screen [facility] 2/9 05/11/2020    Decreased Interest 1   Down, Depressed, Hopeless 0   PHQ - 2 Score 1     Interpretation of Total Score  Total Score Depression Severity:  1-4 = Minimal depression, 5-9 = Mild depression, 10-14 = Moderate depression, 15-19 = Moderately severe depression, 20-27 = Severe depression   Psychosocial Evaluation and Intervention:   Psychosocial Re-Evaluation:   Psychosocial Discharge (Final Psychosocial Re-Evaluation):   Vocational Rehabilitation: Provide vocational rehab assistance to qualifying candidates.   Vocational Rehab Evaluation & Intervention:  Vocational Rehab - 05/11/20 1207      Initial Vocational Rehab Evaluation & Intervention   Assessment shows need for Vocational Rehabilitation No   Dennie Bible is semi retired and does not need vocational rehab at this time.          Education: Education Goals: Education classes will be provided on a weekly basis, covering required topics. Participant will state understanding/return demonstration of topics presented.  Learning Barriers/Preferences:  Learning Barriers/Preferences - 05/11/20 1023      Learning Barriers/Preferences   Learning Barriers Sight   waers glasses   Learning Preferences Video;Pictoral;Individual Instruction;Skilled Demonstration;Computer/Internet           Education Topics: Hypertension, Hypertension Reduction -Define heart disease and high  blood pressure. Discus how high blood pressure affects the body and ways to reduce high blood pressure.   Exercise and Your Heart -Discuss why it is important to exercise, the FITT principles of exercise, normal and abnormal responses to exercise, and how to exercise safely.   Angina -Discuss definition of angina, causes of angina, treatment of angina, and how to decrease risk of having angina.   Cardiac Medications -Review what the following cardiac medications are used for, how they affect the body, and side effects that may occur when taking the medications.   Medications include Aspirin, Beta blockers, calcium channel blockers, ACE Inhibitors, angiotensin receptor blockers, diuretics, digoxin, and antihyperlipidemics.   Congestive Heart Failure -Discuss the definition of CHF, how to live with CHF, the signs and symptoms of CHF, and how keep track of weight and sodium intake.   Heart Disease and Intimacy -Discus the effect sexual activity has on the heart, how changes occur during intimacy as we age, and safety during sexual activity.   Smoking Cessation / COPD -Discuss different methods to quit smoking, the health benefits of quitting smoking, and the definition of COPD.   Nutrition I: Fats -Discuss the types of cholesterol, what cholesterol does to the heart, and how cholesterol levels can be controlled.   Nutrition II: Labels -Discuss the different components of food labels and how to read food label   Heart Parts/Heart Disease and PAD -Discuss the anatomy of the heart, the pathway of blood circulation through the heart, and these are affected by heart disease.   Stress I: Signs and Symptoms -Discuss the causes of stress, how stress may lead to anxiety and depression, and ways to limit stress.   Stress II: Relaxation -Discuss different types of relaxation techniques to limit stress.   Warning Signs of Stroke / TIA -Discuss definition of a stroke, what the signs and symptoms are of a stroke, and how to identify when someone is having stroke.   Knowledge Questionnaire Score:  Knowledge Questionnaire Score - 05/11/20 1018      Knowledge Questionnaire Score   Pre Score 21/24           Core Components/Risk Factors/Patient Goals at Admission:  Personal Goals and Risk Factors at Admission - 05/11/20 1024      Core Components/Risk Factors/Patient Goals on Admission    Weight Management Weight Maintenance;Yes    Intervention Weight Management: Develop a combined nutrition and exercise program designed to reach desired  caloric intake, while maintaining appropriate intake of nutrient and fiber, sodium and fats, and appropriate energy expenditure required for the weight goal.;Weight Management: Provide education and appropriate resources to help participant work on and attain dietary goals.    Expected Outcomes Short Term: Continue to assess and modify interventions until short term weight is achieved;Long Term: Adherence to nutrition and physical activity/exercise program aimed toward attainment of established weight goal;Weight Maintenance: Understanding of the daily nutrition guidelines, which includes 25-35% calories from fat, 7% or less cal from saturated fats, less than 200mg  cholesterol, less than 1.5gm of sodium, & 5 or more servings of fruits and vegetables daily;Understanding recommendations for meals to include 15-35% energy as protein, 25-35% energy from fat, 35-60% energy from carbohydrates, less than 200mg  of dietary cholesterol, 20-35 gm of total fiber daily    Diabetes Yes    Intervention Provide education about signs/symptoms and action to take for hypo/hyperglycemia.;Provide education about proper nutrition, including hydration, and aerobic/resistive exercise prescription along with prescribed medications to achieve blood glucose in normal  ranges: Fasting glucose 65-99 mg/dL    Expected Outcomes Short Term: Participant verbalizes understanding of the signs/symptoms and immediate care of hyper/hypoglycemia, proper foot care and importance of medication, aerobic/resistive exercise and nutrition plan for blood glucose control.;Long Term: Attainment of HbA1C < 7%.    Hypertension Yes    Intervention Provide education on lifestyle modifcations including regular physical activity/exercise, weight management, moderate sodium restriction and increased consumption of fresh fruit, vegetables, and low fat dairy, alcohol moderation, and smoking cessation.;Monitor prescription use compliance.    Expected Outcomes Short  Term: Continued assessment and intervention until BP is < 140/29mm HG in hypertensive participants. < 130/12mm HG in hypertensive participants with diabetes, heart failure or chronic kidney disease.;Long Term: Maintenance of blood pressure at goal levels.    Lipids Yes    Intervention Provide education and support for participant on nutrition & aerobic/resistive exercise along with prescribed medications to achieve LDL 70mg , HDL >40mg .    Expected Outcomes Short Term: Participant states understanding of desired cholesterol values and is compliant with medications prescribed. Participant is following exercise prescription and nutrition guidelines.;Long Term: Cholesterol controlled with medications as prescribed, with individualized exercise RX and with personalized nutrition plan. Value goals: LDL < 70mg , HDL > 40 mg.    Stress Yes    Intervention Offer individual and/or small group education and counseling on adjustment to heart disease, stress management and health-related lifestyle change. Teach and support self-help strategies.;Refer participants experiencing significant psychosocial distress to appropriate mental health specialists for further evaluation and treatment. When possible, include family members and significant others in education/counseling sessions.    Expected Outcomes Short Term: Participant demonstrates changes in health-related behavior, relaxation and other stress management skills, ability to obtain effective social support, and compliance with psychotropic medications if prescribed.;Long Term: Emotional wellbeing is indicated by absence of clinically significant psychosocial distress or social isolation.           Core Components/Risk Factors/Patient Goals Review:    Core Components/Risk Factors/Patient Goals at Discharge (Final Review):    ITP Comments:  ITP Comments    Row Name 05/11/20 0823           ITP Comments Dr 07/11/20 MD, Medical Director               Comments: Patient attended orientation on 05/11/2020 to review rules and guidelines for program.  Completed 6 minute walk test, Intitial ITP, and exercise prescription.  VSS. Telemetry- Sinus . 07/11/2020 reported having mild shortness of breath otherwiese.  Asymptomatic. Safety measures and social distancing in place per CDC guidelines.QUALITY OF LIFE SCORE REVIEW  Pt completed Quality of Life survey as a participant in Cardiac Rehab. Scores 21.0 or below are considered low. Pt score very low in several areas Overall 19.39, Health and Function 19.14, socioeconomic 16.75, physiological and spiritual 18.00, family 25.20. Patient quality of life slightly altered by physical constraints which limits ability to perform as prior to recent cardiac illness. Dennie Bible admits to having little interest in doing things although she denies being depressed. Dennie Bible admits to feeling isolated due to the covid 19 pandemic and living alone. Pat's close friend and son are out of town.  Offered emotional support and reassurance.  Will continue to monitor and intervene as necessary.  Patient admits she may be interested in receiving counseling as she recently had shoulder surgery times two and then the heart attack and stent. Will forward quality of life questionnaire to Dr Dennie Bible office for review and ask about setting up  counseling.Gladstone Lighter, RN,BSN 05/11/2020 12:48 PM

## 2020-05-15 ENCOUNTER — Encounter (HOSPITAL_COMMUNITY)
Admission: RE | Admit: 2020-05-15 | Discharge: 2020-05-15 | Disposition: A | Discharge status: Home / Self Care | Payer: PPO | Source: Ambulatory Visit | Attending: Cardiovascular Disease | Admitting: Cardiovascular Disease

## 2020-05-15 ENCOUNTER — Ambulatory Visit (HOSPITAL_COMMUNITY): Payer: PPO

## 2020-05-15 ENCOUNTER — Other Ambulatory Visit: Payer: Self-pay

## 2020-05-15 DIAGNOSIS — I241 Dressler's syndrome: Secondary | ICD-10-CM | POA: Diagnosis not present

## 2020-05-15 DIAGNOSIS — I214 Non-ST elevation (NSTEMI) myocardial infarction: Secondary | ICD-10-CM

## 2020-05-15 DIAGNOSIS — Z955 Presence of coronary angioplasty implant and graft: Secondary | ICD-10-CM

## 2020-05-15 LAB — GLUCOSE, CAPILLARY
Glucose-Capillary: 149 mg/dL — ABNORMAL HIGH (ref 70–99)
Glucose-Capillary: 96 mg/dL (ref 70–99)

## 2020-05-15 NOTE — Progress Notes (Signed)
Daily Session Note  Patient Details  Name: Christina Larsen MRN: [id number] Date of Birth: 03/22/1955 Referring Provider:   Flowsheet Row CARDIAC REHAB PHASE II ORIENTATION from 05/11/2020 in [place]  Referring Provider Shelva Majestic, MD      Encounter Date: 05/15/2020  Check In:  Session Check In - 05/15/20 0855      Check-In   Supervising physician immediately available to respond to emergencies [facility] immediately available    Physician(s) Dr Doristine Bosworth    Location [facility]    Staff Present Barnet Pall, RN, Milus Glazier, MS, EP-C, CCRP;Carlette Wilber Oliphant, RN, Deland Pretty, MS, ACSM CEP, Exercise Physiologist;Annedrea Rosezella [place], RN, MHA;Other   Zachary George, Academic Intern   Virtual Visit No    Medication changes reported     No    Fall or balance concerns reported    No    Tobacco Cessation No Change    Current number of cigarettes/nicotine per day     0    Warm-up and Cool-down Performed on first and last piece of equipment    Resistance Training Performed Yes    VAD Patient? No    PAD/SET Patient? No      Pain Assessment   Currently in Pain? No/denies    Pain Score 0-No pain    Multiple Pain Sites No           Capillary Blood Glucose: Results for orders placed or performed during the hospital encounter of 05/15/20 (from the past 24 hour(s))  Glucose, capillary     Status: Abnormal   Collection Time: 05/15/20  8:40 AM  Result Value Ref Range   Glucose-Capillary 149 (H) 70 - 99 mg/dL  Glucose, capillary     Status: None   Collection Time: 05/15/20  9:53 AM  Result Value Ref Range   Glucose-Capillary 96 70 - 99 mg/dL     Exercise Prescription Changes - 05/15/20 1000      Response to Exercise   Blood Pressure (Admit) 112/74    Blood Pressure (Exercise) 122/72    Blood Pressure (Exit) 104/64    Heart Rate (Admit) 75 bpm    Heart Rate (Exercise) 97 bpm    Heart Rate (Exit) 84 bpm     Rating of Perceived Exertion (Exercise) 11    Symptoms None    Comments Pt's first day of exercise in the CRP2 program    Duration Progress to 30 minutes of  aerobic without signs/symptoms of physical distress    Intensity THRR unchanged      Progression   Progression Continue to progress workloads to maintain intensity without signs/symptoms of physical distress.    Average METs 2.6      Resistance Training   Training Prescription Yes   Left arm only   Weight 2 lbs    Reps 10-15    Time 10 Minutes      Interval Training   Interval Training No      Bike   Level 1.5    Minutes 15    METs 2.4      Track   Laps 16    Minutes 15    METs 2.86           Social History   Tobacco Use  Smoking Status Former Smoker  . Types: Cigarettes  . Quit date: 01/16/2020  . Years since quitting: 0.3  Smokeless Tobacco Never Used    Goals Met:  Exercise tolerated well No  report of cardiac concerns or symptoms Strength training completed today  Goals Unmet:  Not Applicable  Comments: Pt started cardiac rehab today.  Pt tolerated light exercise without difficulty. VSS, telemetry-Sinus Rhythm, asymptomatic.  Medication list reconciled. Pt denies barriers to medicaiton compliance.  PSYCHOSOCIAL ASSESSMENT:  PHQ-0. Pt exhibits positive coping skills, hopeful outlook with supportive family. No psychosocial needs identified at this time, no psychosocial interventions necessary.    Pt enjoys walking reading and spending time with her dog Moosie.   Pt oriented to exercise equipment and routine.    Understanding verbalized.Barnet Pall, RN,BSN 05/15/2020 4:42 PM   Dr. Fransico Him is Medical Director for Cardiac Rehab at [facility].

## 2020-05-16 DIAGNOSIS — M25511 Pain in right shoulder: Secondary | ICD-10-CM | POA: Diagnosis not present

## 2020-05-17 ENCOUNTER — Other Ambulatory Visit: Payer: Self-pay

## 2020-05-17 ENCOUNTER — Ambulatory Visit (HOSPITAL_COMMUNITY): Payer: PPO

## 2020-05-17 ENCOUNTER — Encounter (HOSPITAL_COMMUNITY)
Admission: RE | Admit: 2020-05-17 | Discharge: 2020-05-17 | Disposition: A | Discharge status: Home / Self Care | Payer: PPO | Source: Ambulatory Visit | Attending: Cardiovascular Disease | Admitting: Cardiovascular Disease

## 2020-05-17 DIAGNOSIS — I214 Non-ST elevation (NSTEMI) myocardial infarction: Secondary | ICD-10-CM

## 2020-05-17 DIAGNOSIS — I241 Dressler's syndrome: Secondary | ICD-10-CM | POA: Diagnosis not present

## 2020-05-17 DIAGNOSIS — Z955 Presence of coronary angioplasty implant and graft: Secondary | ICD-10-CM

## 2020-05-17 LAB — GLUCOSE, CAPILLARY
Glucose-Capillary: 104 mg/dL — ABNORMAL HIGH (ref 70–99)
Glucose-Capillary: 85 mg/dL (ref 70–99)

## 2020-05-19 ENCOUNTER — Encounter (HOSPITAL_COMMUNITY): Payer: PPO

## 2020-05-22 ENCOUNTER — Other Ambulatory Visit: Payer: Self-pay

## 2020-05-22 ENCOUNTER — Encounter (HOSPITAL_COMMUNITY)
Admission: RE | Admit: 2020-05-22 | Discharge: 2020-05-22 | Disposition: A | Discharge status: Home / Self Care | Payer: PPO | Source: Ambulatory Visit | Attending: Cardiovascular Disease | Admitting: Cardiovascular Disease

## 2020-05-22 ENCOUNTER — Ambulatory Visit (HOSPITAL_COMMUNITY): Payer: PPO

## 2020-05-22 DIAGNOSIS — I241 Dressler's syndrome: Secondary | ICD-10-CM | POA: Diagnosis not present

## 2020-05-22 DIAGNOSIS — Z955 Presence of coronary angioplasty implant and graft: Secondary | ICD-10-CM

## 2020-05-22 DIAGNOSIS — I214 Non-ST elevation (NSTEMI) myocardial infarction: Secondary | ICD-10-CM

## 2020-05-22 NOTE — Progress Notes (Addendum)
Christina Larsen 65 y.o. female Nutrition Note  Diagnosis: NSTEMI  Past Medical History:  Diagnosis Date  . Coronary artery disease    11/9 NSTEMI, PCI to pLCx with aspiration thrombectomy/DESx1, occluded mRCA with left to right collaterals  . Diabetes mellitus without complication (HCC)   . HLD (hyperlipidemia)      Medications reviewed.   Current Outpatient Medications:  .  aspirin 81 MG EC tablet, Take 1 tablet (81 mg total) by mouth daily. Swallow whole., Disp: 90 tablet, Rfl: 3 .  atorvastatin (LIPITOR) 80 MG tablet, Take 1 tablet (80 mg total) by mouth daily., Disp: 90 tablet, Rfl: 3 .  gabapentin (NEURONTIN) 300 MG capsule, Take 300 mg by mouth as needed (for nerve pain)., Disp: , Rfl:  .  metFORMIN (GLUCOPHAGE) 1000 MG tablet, Take 1,000 mg by mouth 2 (two) times daily with a meal., Disp: , Rfl:  .  metoprolol tartrate (LOPRESSOR) 25 MG tablet, Take 1 tablet (25 mg total) by mouth 2 (two) times daily., Disp: 180 tablet, Rfl: 3 .  Multiple Vitamin (MULTIVITAMIN) tablet, Take 1 tablet by mouth daily., Disp: , Rfl:  .  nitroGLYCERIN (NITROSTAT) 0.4 MG SL tablet, Place 1 tablet (0.4 mg total) under the tongue every 5 (five) minutes as needed., Disp: 25 tablet, Rfl: 2 .  ticagrelor (BRILINTA) 90 MG TABS tablet, Take 1 tablet (90 mg total) by mouth 2 (two) times daily., Disp: 180 tablet, Rfl: 3 .  TRULICITY 1.5 MG/0.5ML SOPN, Inject 1.5 mg into the muscle every Sunday. , Disp: , Rfl:    Ht Readings from Last 1 Encounters:  05/11/20 5' 5.75" (1.67 m)     Wt Readings from Last 3 Encounters:  05/11/20 153 lb (69.4 kg)  05/09/20 156 lb (70.8 kg)  05/07/20 150 lb (68 kg)     There is no height or weight on file to calculate BMI.   Social History   Tobacco Use  Smoking Status Former Smoker  . Types: Cigarettes  . Quit date: 01/16/2020  . Years since quitting: 0.3  Smokeless Tobacco Never Used     Lab Results  Component Value Date   CHOL 117 03/15/2020   Lab Results   Component Value Date   HDL 42 03/15/2020   Lab Results  Component Value Date   LDLCALC 52 03/15/2020   Lab Results  Component Value Date   TRIG 130 03/15/2020     Lab Results  Component Value Date   HGBA1C 7.3 (H) 01/17/2020     CBG (last 3)  No results for input(s): GLUCAP in the last 72 hours.   Nutrition Note  Spoke with pt. Nutrition Plan and Nutrition Survey goals reviewed with pt. Pt is following a Heart Healthy diet.  Pt has been making heart healthy changes in her diet since STEMI. Pt reveals financial stress related to medication costs. Educated pt on other GLP-1 medications she can ask her PCP about to find cheapest drug. Pt reports poor appetite since trulicity increase about a year ago to 1.5 mg.  She has lost 10-15 lbs in the past year (not dieting).  She attributes some loss of appetite due to stress.  She grocery shops every 3 weeks. She states she does have enough but variety may be limited at times.   Pt has Type 2 Diabetes. Last A1c indicates blood glucose well-controlled. Pt checks CBG's 1 times a day.   Per discussion, pt does not use canned/convenience foods often. Pt does not add salt to food.  Pt does not eat out frequently.  We discussed meal planning/plate method. Reviewed label reading.   Pt expressed understanding of the information reviewed.    Nutrition Diagnosis ? Food-and nutrition-related knowledge deficit related to lack of exposure to information as related to diagnosis of: ? CVD ? Type 2 Diabetes  Nutrition Intervention ? Pt's individual nutrition plan reviewed with pt.  ? Pt given handouts for: ? Label reading ? Continue client-centered nutrition education by RD, as part of interdisciplinary care.  Goal(s) ? Pt to build a healthy plate including vegetables, fruits, whole grains, and low-fat dairy products in a heart healthy meal plan. ? Pt to continue to check blood sugars with goal of Fasting CBGs between 80-130 mg/dl    Plan:    Will provide client-centered nutrition education as part of interdisciplinary care  Monitor and evaluate progress toward nutrition goal with team.   Andrey Campanile, MS, RDN, LDN

## 2020-05-24 ENCOUNTER — Encounter (HOSPITAL_COMMUNITY): Payer: PPO

## 2020-05-24 ENCOUNTER — Ambulatory Visit (HOSPITAL_COMMUNITY): Payer: PPO

## 2020-05-24 DIAGNOSIS — G5601 Carpal tunnel syndrome, right upper limb: Secondary | ICD-10-CM | POA: Diagnosis not present

## 2020-05-24 DIAGNOSIS — M75111 Incomplete rotator cuff tear or rupture of right shoulder, not specified as traumatic: Secondary | ICD-10-CM | POA: Diagnosis not present

## 2020-05-26 ENCOUNTER — Encounter (HOSPITAL_COMMUNITY): Payer: PPO

## 2020-05-29 ENCOUNTER — Ambulatory Visit (HOSPITAL_COMMUNITY): Payer: PPO

## 2020-05-29 ENCOUNTER — Other Ambulatory Visit: Payer: Self-pay

## 2020-05-29 ENCOUNTER — Encounter (HOSPITAL_COMMUNITY)
Admission: RE | Admit: 2020-05-29 | Discharge: 2020-05-29 | Disposition: A | Discharge status: Home / Self Care | Payer: PPO | Source: Ambulatory Visit | Attending: Cardiovascular Disease | Admitting: Cardiovascular Disease

## 2020-05-29 ENCOUNTER — Telehealth: Payer: Self-pay | Admitting: Cardiovascular Disease

## 2020-05-29 DIAGNOSIS — I241 Dressler's syndrome: Secondary | ICD-10-CM | POA: Diagnosis not present

## 2020-05-29 DIAGNOSIS — Z955 Presence of coronary angioplasty implant and graft: Secondary | ICD-10-CM

## 2020-05-29 DIAGNOSIS — I214 Non-ST elevation (NSTEMI) myocardial infarction: Secondary | ICD-10-CM

## 2020-05-29 NOTE — Progress Notes (Signed)
Christina Larsen reports that she is having difficulty paying for her brillinta and trulicity and has filled out financial assistance forms at Dr Tresa Endo and Dr Lanell Matar office. Christina Larsen says that she has received samples from Dr Landry Dyke office. Christina Larsen says that she will call Dr Lanell Matar office to see if samples are available.Gladstone Lighter, RN,BSN 05/29/2020 9:32 AM

## 2020-05-29 NOTE — Telephone Encounter (Signed)
New Message:      Christina Larsen from Cardiac Rehab called. She wants Dr Tresa Endo 'nurse to reach out to this pt. She says pt can not afford her Brilinta and she needs help getting it please.

## 2020-05-29 NOTE — Telephone Encounter (Signed)
Spoke with patient who states she provided [employer] patient assistance paperwork for Brilinta to [place] LPN during last visit with Edd Fabian NP on 05/09/20  Spoke with [employer] rep regarding application: 1. Consent page is signed but NOT dated  2. Needs medicare card # 3. Needs copy of insurance card  Patient call call and provide updates to items 1 & 2 (phone: [phone]) I will fax copy of insurance card (fax: [phone])  Spoke with patient and notified her to call company and provide up to items 1 & 2 and that I will fax her insurance card.

## 2020-05-30 NOTE — Telephone Encounter (Signed)
Faxed insurance card as requested via [employer] fax function.

## 2020-05-30 NOTE — Progress Notes (Signed)
Reviewed home exercise Rx with patient today. Pt walks her dog daily. Encouraged walking 2 x/week for 30-45 minutes. Reviewed THRR of 62-123 and keeping RPE between 11-13. Encouraged warm-up, cool-down, and stretching. Fluids encouraged with fluids. Discussed weather parameters for temperature and humidity for safe exercise outdoors. S/S to terminate exercise reviewed as well as when to call MD vs 911. Reviewed use of NTG. Pt verbalized understanding of the home exercise Rx and was provided a copy.  Lorin Picket MS, ACSM CEP, CCRP

## 2020-05-31 ENCOUNTER — Other Ambulatory Visit: Payer: Self-pay

## 2020-05-31 ENCOUNTER — Ambulatory Visit (HOSPITAL_COMMUNITY): Payer: PPO

## 2020-05-31 ENCOUNTER — Encounter (HOSPITAL_COMMUNITY)
Admission: RE | Admit: 2020-05-31 | Discharge: 2020-05-31 | Disposition: A | Discharge status: Home / Self Care | Payer: PPO | Source: Ambulatory Visit | Attending: Cardiovascular Disease | Admitting: Cardiovascular Disease

## 2020-05-31 DIAGNOSIS — Z955 Presence of coronary angioplasty implant and graft: Secondary | ICD-10-CM

## 2020-05-31 DIAGNOSIS — I241 Dressler's syndrome: Secondary | ICD-10-CM | POA: Diagnosis not present

## 2020-05-31 DIAGNOSIS — I214 Non-ST elevation (NSTEMI) myocardial infarction: Secondary | ICD-10-CM

## 2020-05-31 NOTE — Telephone Encounter (Signed)
TRIED TO CALL [employer] AT 9AM-WILL CB LATER

## 2020-05-31 NOTE — Telephone Encounter (Signed)
Tried to call [employer], on hold 15 min had to hang up. Will CB later

## 2020-06-01 NOTE — Telephone Encounter (Signed)
Tried to call [employer] again on hold 8 minutes. Had to hang up

## 2020-06-02 ENCOUNTER — Encounter (HOSPITAL_COMMUNITY): Payer: PPO

## 2020-06-05 ENCOUNTER — Encounter (HOSPITAL_COMMUNITY)
Admission: RE | Admit: 2020-06-05 | Discharge: 2020-06-05 | Disposition: A | Discharge status: Home / Self Care | Payer: PPO | Source: Ambulatory Visit | Attending: Cardiovascular Disease | Admitting: Cardiovascular Disease

## 2020-06-05 ENCOUNTER — Ambulatory Visit (HOSPITAL_COMMUNITY): Payer: PPO

## 2020-06-05 ENCOUNTER — Other Ambulatory Visit: Payer: Self-pay

## 2020-06-05 DIAGNOSIS — I241 Dressler's syndrome: Secondary | ICD-10-CM | POA: Diagnosis not present

## 2020-06-05 DIAGNOSIS — I214 Non-ST elevation (NSTEMI) myocardial infarction: Secondary | ICD-10-CM

## 2020-06-05 DIAGNOSIS — Z955 Presence of coronary angioplasty implant and graft: Secondary | ICD-10-CM

## 2020-06-06 MED FILL — ASPIRIN ADULT LOW STRENGTH: 81 | 90 days supply | Qty: 90 | Fill #1

## 2020-06-06 NOTE — Telephone Encounter (Signed)
Pt called back and she states that she spoke with [employer] and she is approved for their shipments....she received her first fill yesterday.

## 2020-06-06 NOTE — Progress Notes (Signed)
Cardiac Individual Treatment Plan  Patient Details  Name: Christina Larsen MRN: [id number] Date of Birth: [birth year] Referring Provider:   Flowsheet Row CARDIAC REHAB PHASE II ORIENTATION from 05/11/2020 in [facility] CARDIAC REHAB  Referring Provider [name], MD      Initial Encounter Date:  Flowsheet Row CARDIAC REHAB PHASE II ORIENTATION from 05/11/2020 in [facility] CARDIAC REHAB  Date 05/11/20      Visit Diagnosis: 01/16/20 NSTEMI   01/17/20 S/P DES CFx  Patient's Home Medications on Admission:  Current Outpatient Medications:  .  aspirin 81 MG EC tablet, Take 1 tablet (81 mg total) by mouth daily. Swallow whole., Disp: 90 tablet, Rfl: 3 .  atorvastatin (LIPITOR) 80 MG tablet, Take 1 tablet (80 mg total) by mouth daily., Disp: 90 tablet, Rfl: 3 .  gabapentin (NEURONTIN) 300 MG capsule, Take 300 mg by mouth as needed (for nerve pain)., Disp: , Rfl:  .  metFORMIN (GLUCOPHAGE) 1000 MG tablet, Take 1,000 mg by mouth 2 (two) times daily with a meal., Disp: , Rfl:  .  metoprolol tartrate (LOPRESSOR) 25 MG tablet, Take 1 tablet (25 mg total) by mouth 2 (two) times daily., Disp: 180 tablet, Rfl: 3 .  Multiple Vitamin (MULTIVITAMIN) tablet, Take 1 tablet by mouth daily., Disp: , Rfl:  .  nitroGLYCERIN (NITROSTAT) 0.4 MG SL tablet, Place 1 tablet (0.4 mg total) under the tongue every 5 (five) minutes as needed., Disp: 25 tablet, Rfl: 2 .  ticagrelor (BRILINTA) 90 MG TABS tablet, Take 1 tablet (90 mg total) by mouth 2 (two) times daily., Disp: 180 tablet, Rfl: 3 .  TRULICITY 1.5 MG/0.5ML SOPN, Inject 1.5 mg into the muscle every Sunday. , Disp: , Rfl:   Past Medical History: Past Medical History:  Diagnosis Date  . Coronary artery disease    11/9 NSTEMI, PCI to pLCx with aspiration thrombectomy/DESx1, occluded mRCA with left to right collaterals  . Diabetes mellitus without complication (HCC)   . HLD (hyperlipidemia)     Tobacco Use: Social History    Tobacco Use  Smoking Status Former Smoker  . Types: Cigarettes  . Quit date: 01/16/2020  . Years since quitting: 0.3  Smokeless Tobacco Never Used    Labs: Recent Review Advice worker    Labs for ITP Cardiac and Pulmonary Rehab Latest Ref Rng & Units 01/17/2020 03/15/2020   Cholestrol 100 - 199 mg/dL 170 017   LDLCALC 0 - 99 mg/dL 81 52   HDL >49 mg/dL 44(H) 42   Trlycerides 0 - 149 mg/dL 675(F) 163   Hemoglobin A1c 4.8 - 5.6 % 7.3(H) -      Capillary Blood Glucose: Lab Results  Component Value Date   GLUCAP 85 05/17/2020   GLUCAP 104 (H) 05/17/2020   GLUCAP 96 05/15/2020   GLUCAP 149 (H) 05/15/2020   GLUCAP 141 (H) 01/19/2020     Exercise Target Goals: Exercise Program Goal: Individual exercise prescription set using results from initial 6 min walk test and THRR while considering  patient's activity barriers and safety.   Exercise Prescription Goal: Starting with aerobic activity 30 plus minutes a day, 3 days per week for initial exercise prescription. Provide home exercise prescription and guidelines that participant acknowledges understanding prior to discharge.  Activity Barriers & Risk Stratification:  Activity Barriers & Cardiac Risk Stratification - 05/11/20 1028      Activity Barriers & Cardiac Risk Stratification   Activity Barriers Joint Problems;Deconditioning;Muscular Weakness;Other (comment)    Comments S/P surgical  repair of right rotator cuff x 2    Cardiac Risk Stratification High           6 Minute Walk:  6 Minute Walk    Row Name 05/11/20 0833         6 Minute Walk   Distance 1373 feet     Walk Time 6 minutes     # of Rest Breaks 0     MPH 2.6     METS 3.29     RPE 12     Perceived Dyspnea  1     VO2 Peak 11.52     Symptoms Yes (comment)     Comments SOB, RPD = 1     Resting HR 81 bpm     Resting BP 94/60     Resting Oxygen Saturation  96 %     Exercise Oxygen Saturation  during 6 min walk 98 %     Max Ex. HR 93 bpm     Max Ex.  BP 130/80     2 Minute Post BP 114/72            Oxygen Initial Assessment:   Oxygen Re-Evaluation:   Oxygen Discharge (Final Oxygen Re-Evaluation):   Initial Exercise Prescription:  Initial Exercise Prescription - 05/11/20 1000      Date of Initial Exercise RX and Referring Provider   Date 05/11/20    Referring Provider [name], MD    Expected Discharge Date 07/08/19      Bike   Level 1.5    Minutes 15    METs 2      Track   Laps 13    Minutes 15    METs 2.51      Prescription Details   Frequency (times per week) 3    Duration Progress to 30 minutes of continuous aerobic without signs/symptoms of physical distress      Intensity   THRR 40-80% of Max Heartrate 62-123    Ratings of Perceived Exertion 11-13    Perceived Dyspnea 0-4      Progression   Progression Continue progressive overload as per policy without signs/symptoms or physical distress.      Resistance Training   Training Prescription Yes   left arm only   Weight 2 lbs    Reps 10-15           Perform Capillary Blood Glucose checks as needed.  Exercise Prescription Changes:   Exercise Prescription Changes    Row Name 05/15/20 1000 05/29/20 0958 05/30/20 0900         Response to Exercise   Blood Pressure (Admit) 112/74 120/70 --     Blood Pressure (Exercise) 122/72 136/78 --     Blood Pressure (Exit) 104/64 112/70 --     Heart Rate (Admit) 75 bpm 83 bpm --     Heart Rate (Exercise) 97 bpm 97 bpm --     Heart Rate (Exit) 84 bpm 82 bpm --     Rating of Perceived Exertion (Exercise) 11 11 --     Symptoms None None --     Comments Pt's first day of exercise in the CRP2 program Reviewed METs and Home exercise Rx --     Duration Progress to 30 minutes of  aerobic without signs/symptoms of physical distress Continue with 30 min of aerobic exercise without signs/symptoms of physical distress. --     Intensity THRR unchanged THRR unchanged --  Progression   Progression  Continue to progress workloads to maintain intensity without signs/symptoms of physical distress. Continue to progress workloads to maintain intensity without signs/symptoms of physical distress. --     Average METs 2.6 2.6 --           Resistance Training   Training Prescription Yes  Left arm only No No     Weight 2 lbs -- --     Reps 10-15 -- --     Time 10 Minutes -- --           Interval Training   Interval Training No No No           Bike   Level 1.5 -- --     Minutes 15 -- --     METs 2.4 -- --           NuStep   Level -- 2 --     SPM -- 85 --     Minutes -- 15 --     METs -- 2.3 --           Track   Laps 16 16 --     Minutes 15 15 --     METs 2.86 2.86 --           Home Exercise Plan   Plans to continue exercise at -- Home (comment) --     Frequency -- Add 2 additional days to program exercise sessions. --     Initial Home Exercises Provided -- 05/29/20 --            Exercise Comments:   Exercise Comments    Row Name 05/15/20 1032 05/29/20 0958         Exercise Comments Pt's first day of exercise in the CRP2 program. Pt tolerated session well with no complaints. No stretching or weights with the right arm due to h/o shoulder surgery and on-going PT for this shoulder. Reviewed METs and home exercise Rx. Switched patient from bike to nustep for the comfort of her right arm/shoulder which has extensive orthopedic issues. Pt finds the nustep better on the shoulder and experiences less soreness after exercise with this modality. Pt verbalized understnading of the home exercise Rx and was provided a copy.             Exercise Goals and Review:   Exercise Goals    Row Name 05/11/20 1030             Exercise Goals   Increase Physical Activity Yes       Intervention Provide advice, education, support and counseling about physical activity/exercise needs.;Develop an individualized exercise prescription for aerobic and resistive training based on initial  evaluation findings, risk stratification, comorbidities and participant's personal goals.       Expected Outcomes Short Term: Attend rehab on a regular basis to increase amount of physical activity.;Long Term: Add in home exercise to make exercise part of routine and to increase amount of physical activity.;Long Term: Exercising regularly at least 3-5 days a week.       Increase Strength and Stamina Yes       Intervention Provide advice, education, support and counseling about physical activity/exercise needs.;Develop an individualized exercise prescription for aerobic and resistive training based on initial evaluation findings, risk stratification, comorbidities and participant's personal goals.       Expected Outcomes Long Term: Improve cardiorespiratory fitness, muscular endurance and strength as measured by increased METs and functional capacity ( );Short Term:  Perform resistance training exercises routinely during rehab and add in resistance training at home;Short Term: Increase workloads from initial exercise prescription for resistance, speed, and METs.       Able to understand and use rate of perceived exertion (RPE) scale Yes       Intervention Provide education and explanation on how to use RPE scale       Expected Outcomes Short Term: Able to use RPE daily in rehab to express subjective intensity level;Long Term:  Able to use RPE to guide intensity level when exercising independently       Knowledge and understanding of Target Heart Rate Range (THRR) Yes       Intervention Provide education and explanation of THRR including how the numbers were predicted and where they are located for reference       Expected Outcomes Short Term: Able to state/look up THRR;Short Term: Able to use daily as guideline for intensity in rehab;Long Term: Able to use THRR to govern intensity when exercising independently       Understanding of Exercise Prescription Yes       Intervention Provide education,  explanation, and written materials on patient's individual exercise prescription       Expected Outcomes Short Term: Able to explain program exercise prescription;Long Term: Able to explain home exercise prescription to exercise independently              Exercise Goals Re-Evaluation :  Exercise Goals Re-Evaluation    Row Name 05/15/20 1027 05/29/20 0958           Exercise Goal Re-Evaluation   Exercise Goals Review Increase Physical Activity;Increase Strength and Stamina;Able to understand and use rate of perceived exertion (RPE) scale;Knowledge and understanding of Target Heart Rate Range (THRR);Understanding of Exercise Prescription Increase Physical Activity;Increase Strength and Stamina;Able to understand and use rate of perceived exertion (RPE) scale;Knowledge and understanding of Target Heart Rate Range (THRR);Able to check pulse independently;Understanding of Exercise Prescription      Comments Pt's first day in the CRP2 program. Pt tolerated session well and understands the RPE scale, THRR, and Exercise Rx. Reviewed Home exercsie Rx/METs. Pt walking daily with her dog. Encouraged walking at least 2x/week for 30-45 minutes,      Expected Outcomes Will continue to monitor patients and progress exercise workloads as tolerated. Pt will conntinue to exercise at home on her own.              Discharge Exercise Prescription (Final Exercise Prescription Changes):  Exercise Prescription Changes - 05/30/20 0900      Resistance Training   Training Prescription No      Interval Training   Interval Training No           Nutrition:  Target Goals: Understanding of nutrition guidelines, daily intake of sodium 1500mg , cholesterol 200mg , calories 30% from fat and 7% or less from saturated fats, daily to have 5 or more servings of fruits and vegetables.  Biometrics:  Pre Biometrics - 05/11/20 1021      Pre Biometrics   Waist Circumference 38.25 inches    Hip Circumference 41.25  inches    Waist to Hip Ratio 0.93 %    Triceps Skinfold 29 mm    % Body Fat 38.6 %    Grip Strength 23 kg    Flexibility 14.75 in    Single Leg Stand 30 seconds            Nutrition Therapy Plan and Nutrition Goals:  Nutrition  Therapy & Goals - 05/22/20 1206      Nutrition Therapy   Diet TLC; carb modified      Personal Nutrition Goals   Nutrition Goal Pt to build a healthy plate including vegetables, fruits, whole grains, and low-fat dairy products in a heart healthy meal plan.    Personal Goal #2 Pt to continue to check blood sugars with goal of Fasting CBGs between 80-130 mg/dl      Intervention Plan   Intervention Prescribe, educate and counsel regarding individualized specific dietary modifications aiming towards targeted core components such as weight, hypertension, lipid management, diabetes, heart failure and other comorbidities.;Nutrition handout(s) given to patient.    Expected Outcomes Short Term Goal: A plan has been developed with personal nutrition goals set during dietitian appointment.;Long Term Goal: Adherence to prescribed nutrition plan.           Nutrition Assessments:  MEDIFICTS Score Key:  ?70 Need to make dietary changes   40-70 Heart Healthy Diet  ? 40 Therapeutic Level Cholesterol Diet  Flowsheet Row CARDIAC REHAB PHASE II EXERCISE from 05/22/2020 in [facility] CARDIAC REHAB  Picture Your Plate Total Score on Admission 81     Picture Your Plate Scores:  <16 Unhealthy dietary pattern with much room for improvement.  41-50 Dietary pattern unlikely to meet recommendations for good health and room for improvement.  51-60 More healthful dietary pattern, with some room for improvement.   >60 Healthy dietary pattern, although there may be some specific behaviors that could be improved.    Nutrition Goals Re-Evaluation:  Nutrition Goals Re-Evaluation    Row Name 05/22/20 1209             Goals   Current Weight 153 lb  (69.4 kg)       Nutrition Goal Pt to build a healthy plate including vegetables, fruits, whole grains, and low-fat dairy products in a heart healthy meal plan.               Personal Goal #2 Re-Evaluation   Personal Goal #2 Pt to continue to check blood sugars with goal of Fasting CBGs between 80-130 mg/dl              Nutrition Goals Discharge (Final Nutrition Goals Re-Evaluation):  Nutrition Goals Re-Evaluation - 05/22/20 1209      Goals   Current Weight 153 lb (69.4 kg)    Nutrition Goal Pt to build a healthy plate including vegetables, fruits, whole grains, and low-fat dairy products in a heart healthy meal plan.      Personal Goal #2 Re-Evaluation   Personal Goal #2 Pt to continue to check blood sugars with goal of Fasting CBGs between 80-130 mg/dl           Psychosocial: Target Goals: Acknowledge presence or absence of significant depression and/or stress, maximize coping skills, provide positive support system. Participant is able to verbalize types and ability to use techniques and skills needed for reducing stress and depression.  Initial Review & Psychosocial Screening:  Initial Psych Review & Screening - 05/11/20 1155      Initial Review   Current issues with Current Stress Concerns    Source of Stress Concerns Chronic Illness;Unable to perform yard/household activities;Occupation;Retirement/disability;Financial;Unable to participate in former interests or hobbies    Comments Dennie Bible had Shoulder surgery and then a MI and stent right after. Dennie Bible says she is not depressed but has been through a lot emotionally.      Family Dynamics  Good Support System? Yes   Pat lives alone. Dennie Bible has a son who lives in [place] and a neighbor and a friend who lives in [place] for support.   Comments Dennie Bible recently relocated to [facility] from [place] for work. Dennie Bible admits to feeling isolated during the pandemic      Barriers   Psychosocial barriers to participate in program The  patient should benefit from training in stress management and relaxation.      Screening Interventions   Interventions To provide support and resources with identified psychosocial needs    Expected Outcomes Long Term Goal: Stressors or current issues are controlled or eliminated.;Short Term goal: Utilizing psychosocial counselor, staff and physician to assist with identification of specific Stressors or current issues interfering with healing process. Setting desired goal for each stressor or current issue identified.;Short Term goal: Identification and review with participant of any Quality of Life or Depression concerns found by scoring the questionnaire.;Long Term goal: The participant improves quality of Life and PHQ9 Scores as seen by post scores and/or verbalization of changes           Quality of Life Scores:  Quality of Life - 05/11/20 1018      Quality of Life   Select Quality of Life      Quality of Life Scores   Health/Function Pre 19.14 %    Socioeconomic Pre 16.75 %    Psych/Spiritual Pre 18 %    Family Pre 25.2 %    GLOBAL Pre 19.39 %          Scores of 19 and below usually indicate a poorer quality of life in these areas.  A difference of  2-3 points is a clinically meaningful difference.  A difference of 2-3 points in the total score of the Quality of Life Index has been associated with significant improvement in overall quality of life, self-image, physical symptoms, and general health in studies assessing change in quality of life.  PHQ-9: Recent Review Flowsheet Data    Depression screen [facility] 2/9 05/11/2020   Decreased Interest 1   Down, Depressed, Hopeless 0   PHQ - 2 Score 1     Interpretation of Total Score  Total Score Depression Severity:  1-4 = Minimal depression, 5-9 = Mild depression, 10-14 = Moderate depression, 15-19 = Moderately severe depression, 20-27 = Severe depression   Psychosocial Evaluation and Intervention:   Psychosocial  Re-Evaluation:  Psychosocial Re-Evaluation    Row Name 06/06/20 1532             Psychosocial Re-Evaluation   Current issues with Current Stress Concerns       Comments Pat continnues to have healtth and financial strssors       Expected Outcomes Dennie Bible will have decreased stress upon completion of phase 2 cardiac rehab       Interventions Encouraged to attend Cardiac Rehabilitation for the exercise;Stress management education       Continue Psychosocial Services  Follow up required by staff       Comments Will continue to offer support as needed. Dennie Bible is working one day a week.               Initial Review   Source of Stress Concerns Chronic Illness;Unable to participate in former interests or hobbies;Retirement/disability;Occupation              Psychosocial Discharge (Final Psychosocial Re-Evaluation):  Psychosocial Re-Evaluation - 06/06/20 1532      Psychosocial Re-Evaluation   Current  issues with Current Stress Concerns    Comments Pat continnues to have healtth and financial strssors    Expected Outcomes Dennie Bible will have decreased stress upon completion of phase 2 cardiac rehab    Interventions Encouraged to attend Cardiac Rehabilitation for the exercise;Stress management education    Continue Psychosocial Services  Follow up required by staff    Comments Will continue to offer support as needed. Dennie Bible is working one day a week.      Initial Review   Source of Stress Concerns Chronic Illness;Unable to participate in former interests or hobbies;Retirement/disability;Occupation           Vocational Rehabilitation: Provide vocational rehab assistance to qualifying candidates.   Vocational Rehab Evaluation & Intervention:  Vocational Rehab - 05/11/20 1207      Initial Vocational Rehab Evaluation & Intervention   Assessment shows need for Vocational Rehabilitation No   Dennie Bible is semi retired and does not need vocational rehab at this time.          Education: Education  Goals: Education classes will be provided on a weekly basis, covering required topics. Participant will state understanding/return demonstration of topics presented.  Learning Barriers/Preferences:  Learning Barriers/Preferences - 05/11/20 1023      Learning Barriers/Preferences   Learning Barriers Sight   waers glasses   Learning Preferences Video;Pictoral;Individual Instruction;Skilled Demonstration;Computer/Internet           Education Topics: Hypertension, Hypertension Reduction -Define heart disease and high blood pressure. Discus how high blood pressure affects the body and ways to reduce high blood pressure.   Exercise and Your Heart -Discuss why it is important to exercise, the FITT principles of exercise, normal and abnormal responses to exercise, and how to exercise safely.   Angina -Discuss definition of angina, causes of angina, treatment of angina, and how to decrease risk of having angina.   Cardiac Medications -Review what the following cardiac medications are used for, how they affect the body, and side effects that may occur when taking the medications.  Medications include Aspirin, Beta blockers, calcium channel blockers, ACE Inhibitors, angiotensin receptor blockers, diuretics, digoxin, and antihyperlipidemics.   Congestive Heart Failure -Discuss the definition of CHF, how to live with CHF, the signs and symptoms of CHF, and how keep track of weight and sodium intake.   Heart Disease and Intimacy -Discus the effect sexual activity has on the heart, how changes occur during intimacy as we age, and safety during sexual activity.   Smoking Cessation / COPD -Discuss different methods to quit smoking, the health benefits of quitting smoking, and the definition of COPD.   Nutrition I: Fats -Discuss the types of cholesterol, what cholesterol does to the heart, and how cholesterol levels can be controlled.   Nutrition II: Labels -Discuss the different  components of food labels and how to read food label   Heart Parts/Heart Disease and PAD -Discuss the anatomy of the heart, the pathway of blood circulation through the heart, and these are affected by heart disease.   Stress I: Signs and Symptoms -Discuss the causes of stress, how stress may lead to anxiety and depression, and ways to limit stress.   Stress II: Relaxation -Discuss different types of relaxation techniques to limit stress.   Warning Signs of Stroke / TIA -Discuss definition of a stroke, what the signs and symptoms are of a stroke, and how to identify when someone is having stroke.   Knowledge Questionnaire Score:  Knowledge Questionnaire Score - 05/11/20 1018  Knowledge Questionnaire Score   Pre Score 21/24           Core Components/Risk Factors/Patient Goals at Admission:  Personal Goals and Risk Factors at Admission - 05/11/20 1024      Core Components/Risk Factors/Patient Goals on Admission    Weight Management Weight Maintenance;Yes    Intervention Weight Management: Develop a combined nutrition and exercise program designed to reach desired caloric intake, while maintaining appropriate intake of nutrient and fiber, sodium and fats, and appropriate energy expenditure required for the weight goal.;Weight Management: Provide education and appropriate resources to help participant work on and attain dietary goals.    Expected Outcomes Short Term: Continue to assess and modify interventions until short term weight is achieved;Long Term: Adherence to nutrition and physical activity/exercise program aimed toward attainment of established weight goal;Weight Maintenance: Understanding of the daily nutrition guidelines, which includes 25-35% calories from fat, 7% or less cal from saturated fats, less than 200mg  cholesterol, less than 1.5gm of sodium, & 5 or more servings of fruits and vegetables daily;Understanding recommendations for meals to include 15-35% energy  as protein, 25-35% energy from fat, 35-60% energy from carbohydrates, less than 200mg  of dietary cholesterol, 20-35 gm of total fiber daily    Diabetes Yes    Intervention Provide education about signs/symptoms and action to take for hypo/hyperglycemia.;Provide education about proper nutrition, including hydration, and aerobic/resistive exercise prescription along with prescribed medications to achieve blood glucose in normal ranges: Fasting glucose 65-99 mg/dL    Expected Outcomes Short Term: Participant verbalizes understanding of the signs/symptoms and immediate care of hyper/hypoglycemia, proper foot care and importance of medication, aerobic/resistive exercise and nutrition plan for blood glucose control.;Long Term: Attainment of HbA1C < 7%.    Hypertension Yes    Intervention Provide education on lifestyle modifcations including regular physical activity/exercise, weight management, moderate sodium restriction and increased consumption of fresh fruit, vegetables, and low fat dairy, alcohol moderation, and smoking cessation.;Monitor prescription use compliance.    Expected Outcomes Short Term: Continued assessment and intervention until BP is < 140/2290mm HG in hypertensive participants. < 130/6580mm HG in hypertensive participants with diabetes, heart failure or chronic kidney disease.;Long Term: Maintenance of blood pressure at goal levels.    Lipids Yes    Intervention Provide education and support for participant on nutrition & aerobic/resistive exercise along with prescribed medications to achieve LDL 70mg , HDL >40mg .    Expected Outcomes Short Term: Participant states understanding of desired cholesterol values and is compliant with medications prescribed. Participant is following exercise prescription and nutrition guidelines.;Long Term: Cholesterol controlled with medications as prescribed, with individualized exercise RX and with personalized nutrition plan. Value goals: LDL < 70mg , HDL > 40 mg.     Stress Yes    Intervention Offer individual and/or small group education and counseling on adjustment to heart disease, stress management and health-related lifestyle change. Teach and support self-help strategies.;Refer participants experiencing significant psychosocial distress to appropriate mental health specialists for further evaluation and treatment. When possible, include family members and significant others in education/counseling sessions.    Expected Outcomes Short Term: Participant demonstrates changes in health-related behavior, relaxation and other stress management skills, ability to obtain effective social support, and compliance with psychotropic medications if prescribed.;Long Term: Emotional wellbeing is indicated by absence of clinically significant psychosocial distress or social isolation.           Core Components/Risk Factors/Patient Goals Review:   Goals and Risk Factor Review    Row Name 06/06/20 1536  Core Components/Risk Factors/Patient Goals Review   Personal Goals Review Weight Management/Obesity;Stress;Hypertension;Diabetes;Lipids       Review Dennie Bible has been doing fair with exercise despite her limitations form her recent shoulder surgery. Vital signs and CBG's have been stable       Expected Outcomes Dennie Bible will continue to participate in phase 2 cardiac rehab for exercise nutrition and lifestyle modifications              Core Components/Risk Factors/Patient Goals at Discharge (Final Review):   Goals and Risk Factor Review - 06/06/20 1536      Core Components/Risk Factors/Patient Goals Review   Personal Goals Review Weight Management/Obesity;Stress;Hypertension;Diabetes;Lipids    Review Dennie Bible has been doing fair with exercise despite her limitations form her recent shoulder surgery. Vital signs and CBG's have been stable    Expected Outcomes Dennie Bible will continue to participate in phase 2 cardiac rehab for exercise nutrition and lifestyle  modifications           ITP Comments:  ITP Comments    Row Name 05/11/20 0823 06/06/20 1530         ITP Comments Dr Armanda Magic MD, Medical Director 30 Day ITP Review. Dennie Bible has good attendance and participation in phase 2 cardiac rehab             Comments: See ITP comments.Gladstone Lighter, RN,BSN 06/06/2020 3:40 PM

## 2020-06-07 ENCOUNTER — Other Ambulatory Visit: Payer: Self-pay

## 2020-06-07 ENCOUNTER — Encounter (HOSPITAL_COMMUNITY)
Admission: RE | Admit: 2020-06-07 | Discharge: 2020-06-07 | Disposition: A | Discharge status: Home / Self Care | Payer: PPO | Source: Ambulatory Visit | Attending: Cardiovascular Disease | Admitting: Cardiovascular Disease

## 2020-06-07 ENCOUNTER — Ambulatory Visit (HOSPITAL_COMMUNITY): Payer: PPO

## 2020-06-07 DIAGNOSIS — I241 Dressler's syndrome: Secondary | ICD-10-CM | POA: Diagnosis not present

## 2020-06-07 DIAGNOSIS — Z955 Presence of coronary angioplasty implant and graft: Secondary | ICD-10-CM

## 2020-06-07 DIAGNOSIS — I214 Non-ST elevation (NSTEMI) myocardial infarction: Secondary | ICD-10-CM

## 2020-06-09 ENCOUNTER — Encounter (HOSPITAL_COMMUNITY): Payer: PPO

## 2020-06-10 ENCOUNTER — Other Ambulatory Visit (HOSPITAL_BASED_OUTPATIENT_CLINIC_OR_DEPARTMENT_OTHER): Payer: Self-pay

## 2020-06-12 ENCOUNTER — Encounter (HOSPITAL_COMMUNITY)
Admission: RE | Admit: 2020-06-12 | Discharge: 2020-06-12 | Disposition: A | Discharge status: Home / Self Care | Payer: PPO | Source: Ambulatory Visit | Attending: Cardiovascular Disease | Admitting: Cardiovascular Disease

## 2020-06-12 ENCOUNTER — Other Ambulatory Visit: Payer: Self-pay

## 2020-06-12 ENCOUNTER — Ambulatory Visit (HOSPITAL_COMMUNITY): Payer: PPO

## 2020-06-12 DIAGNOSIS — Z955 Presence of coronary angioplasty implant and graft: Secondary | ICD-10-CM | POA: Insufficient documentation

## 2020-06-12 DIAGNOSIS — I214 Non-ST elevation (NSTEMI) myocardial infarction: Secondary | ICD-10-CM | POA: Insufficient documentation

## 2020-06-12 NOTE — Progress Notes (Signed)
Christina Larsen Bible said she experienced increased shortness of breath over the weekend while walking her dog and going up a hill. Patient said that she also noticed feeling a little more short of breath this morning while talking to [facility] staff. Denies chest pain. Upon assessment  Fine crackles right posterior base otherwise. Lung fields clear upon ascultation. No peripheral edema noted. Oxygen saturation 98% on room air.Wt 71.8 kg. Georgie Chard NP paged and notified. [name] said to encourage the patient to  drink a beverage that has caffeine with her Brillinta and to notify Dr Landry Dyke office if symptoms persist. Patient is asymptomatic currently and is exercising without complaints. Will continue to monitor the patient throughout  the program.Laurella Tull Harlon Flor, RN,BSN 06/12/2020 9:23 AM

## 2020-06-14 ENCOUNTER — Other Ambulatory Visit: Payer: Self-pay

## 2020-06-14 ENCOUNTER — Ambulatory Visit (HOSPITAL_COMMUNITY): Payer: PPO

## 2020-06-14 ENCOUNTER — Encounter (HOSPITAL_COMMUNITY)
Admission: RE | Admit: 2020-06-14 | Discharge: 2020-06-14 | Disposition: A | Discharge status: Home / Self Care | Payer: PPO | Source: Ambulatory Visit | Attending: Cardiovascular Disease | Admitting: Cardiovascular Disease

## 2020-06-14 DIAGNOSIS — Z955 Presence of coronary angioplasty implant and graft: Secondary | ICD-10-CM

## 2020-06-14 DIAGNOSIS — I214 Non-ST elevation (NSTEMI) myocardial infarction: Secondary | ICD-10-CM | POA: Diagnosis not present

## 2020-06-16 ENCOUNTER — Encounter (HOSPITAL_COMMUNITY): Payer: PPO

## 2020-06-19 ENCOUNTER — Other Ambulatory Visit: Payer: Self-pay

## 2020-06-19 ENCOUNTER — Encounter (HOSPITAL_COMMUNITY)
Admission: RE | Admit: 2020-06-19 | Discharge: 2020-06-19 | Disposition: A | Discharge status: Home / Self Care | Payer: PPO | Source: Ambulatory Visit | Attending: Cardiovascular Disease | Admitting: Cardiovascular Disease

## 2020-06-19 ENCOUNTER — Ambulatory Visit (HOSPITAL_COMMUNITY): Payer: PPO

## 2020-06-19 DIAGNOSIS — I214 Non-ST elevation (NSTEMI) myocardial infarction: Secondary | ICD-10-CM | POA: Diagnosis not present

## 2020-06-19 DIAGNOSIS — Z720 Tobacco use: Secondary | ICD-10-CM | POA: Diagnosis not present

## 2020-06-19 DIAGNOSIS — E1169 Type 2 diabetes mellitus with other specified complication: Secondary | ICD-10-CM | POA: Diagnosis not present

## 2020-06-19 DIAGNOSIS — I10 Essential (primary) hypertension: Secondary | ICD-10-CM | POA: Diagnosis not present

## 2020-06-19 DIAGNOSIS — Z955 Presence of coronary angioplasty implant and graft: Secondary | ICD-10-CM

## 2020-06-21 ENCOUNTER — Other Ambulatory Visit: Payer: Self-pay

## 2020-06-21 ENCOUNTER — Encounter (HOSPITAL_COMMUNITY)
Admission: RE | Admit: 2020-06-21 | Discharge: 2020-06-21 | Disposition: A | Discharge status: Home / Self Care | Payer: PPO | Source: Ambulatory Visit | Attending: Cardiovascular Disease | Admitting: Cardiovascular Disease

## 2020-06-21 ENCOUNTER — Ambulatory Visit (HOSPITAL_COMMUNITY): Payer: PPO

## 2020-06-21 DIAGNOSIS — I214 Non-ST elevation (NSTEMI) myocardial infarction: Secondary | ICD-10-CM | POA: Diagnosis not present

## 2020-06-21 DIAGNOSIS — Z955 Presence of coronary angioplasty implant and graft: Secondary | ICD-10-CM

## 2020-06-23 ENCOUNTER — Encounter (HOSPITAL_COMMUNITY): Payer: PPO

## 2020-06-26 ENCOUNTER — Ambulatory Visit (HOSPITAL_COMMUNITY): Payer: PPO

## 2020-06-26 ENCOUNTER — Encounter (HOSPITAL_COMMUNITY)
Admission: RE | Admit: 2020-06-26 | Discharge: 2020-06-26 | Disposition: A | Discharge status: Home / Self Care | Payer: PPO | Source: Ambulatory Visit | Attending: Cardiovascular Disease | Admitting: Cardiovascular Disease

## 2020-06-26 ENCOUNTER — Other Ambulatory Visit: Payer: Self-pay

## 2020-06-26 DIAGNOSIS — Z955 Presence of coronary angioplasty implant and graft: Secondary | ICD-10-CM

## 2020-06-26 DIAGNOSIS — I214 Non-ST elevation (NSTEMI) myocardial infarction: Secondary | ICD-10-CM | POA: Diagnosis not present

## 2020-06-28 ENCOUNTER — Encounter (HOSPITAL_COMMUNITY)
Admission: RE | Admit: 2020-06-28 | Discharge: 2020-06-28 | Disposition: A | Discharge status: Home / Self Care | Payer: PPO | Source: Ambulatory Visit | Attending: Cardiovascular Disease | Admitting: Cardiovascular Disease

## 2020-06-28 ENCOUNTER — Other Ambulatory Visit: Payer: Self-pay

## 2020-06-28 ENCOUNTER — Ambulatory Visit (HOSPITAL_COMMUNITY): Payer: PPO

## 2020-06-28 DIAGNOSIS — Z955 Presence of coronary angioplasty implant and graft: Secondary | ICD-10-CM

## 2020-06-28 DIAGNOSIS — I214 Non-ST elevation (NSTEMI) myocardial infarction: Secondary | ICD-10-CM | POA: Diagnosis not present

## 2020-06-28 NOTE — Progress Notes (Signed)
Nutrition Note  Pt reports poor appetite since starting trulicity. She does not report any GI side effects. No significant weight loss.  Reviewed diabetes meal planning using plate method. Pt having very little motivation to create balanced meals when her appetite is poor. She thinks a meal plan would be helpful.  Reviewed pt preferred daily intake. Provided pt with meal plan based on her preferred intake. Pt continues to monitor fasting CBGs. Typically in the 120's mg/dl. Pt verbalized understanding.    Nutrition Diagnosis   Food-and nutrition-related knowledge deficit related to lack of exposure to information as related to diagnosis of: ? CVD ? Type 2 Diabetes  Nutrition Intervention   Pt's individual nutrition plan reviewed with pt.      Pt given handouts for: ? Label reading  Continue client-centered nutrition education by RD, as part of interdisciplinary care.  Goal(s)  Pt to build a healthy plate including vegetables, fruits, whole grains, and low-fat dairy products in a heart healthy meal plan.  Pt to continue to check blood sugars with goal of Fasting CBGs between 80-130 mg/dl    Plan:  Will provide client-centered nutrition education as part of interdisciplinary care  Monitor and evaluate progress toward nutrition goal with team.  Andrey Campanile, MS, RDN, LDN

## 2020-06-30 ENCOUNTER — Encounter (HOSPITAL_COMMUNITY): Payer: PPO

## 2020-07-03 ENCOUNTER — Other Ambulatory Visit: Payer: Self-pay

## 2020-07-03 ENCOUNTER — Encounter (HOSPITAL_COMMUNITY)
Admission: RE | Admit: 2020-07-03 | Discharge: 2020-07-03 | Disposition: A | Discharge status: Home / Self Care | Payer: PPO | Source: Ambulatory Visit | Attending: Cardiovascular Disease | Admitting: Cardiovascular Disease

## 2020-07-03 DIAGNOSIS — Z955 Presence of coronary angioplasty implant and graft: Secondary | ICD-10-CM

## 2020-07-03 DIAGNOSIS — I214 Non-ST elevation (NSTEMI) myocardial infarction: Secondary | ICD-10-CM | POA: Diagnosis not present

## 2020-07-04 NOTE — Progress Notes (Signed)
Cardiac Individual Treatment Plan  Patient Details  Name: Christina Larsen MRN: [id number] Date of Birth: [birth year] Referring Provider:   Flowsheet Row CARDIAC REHAB PHASE II ORIENTATION from 05/11/2020 in [facility] CARDIAC REHAB  Referring Provider [name], MD      Initial Encounter Date:  Flowsheet Row CARDIAC REHAB PHASE II ORIENTATION from 05/11/2020 in [facility] CARDIAC REHAB  Date 05/11/20      Visit Diagnosis: 01/16/20 NSTEMI   01/17/20 S/P DES CFx  Patient's Home Medications on Admission:  Current Outpatient Medications:  .  aspirin 81 MG EC tablet, TAKE 1 TABLET (81 MG TOTAL) BY MOUTH DAILY. SWALLOW WHOLE. (Patient taking differently: Take by mouth 5 (five) times daily. SWALLOW WHOLE.), Disp: 30 tablet, Rfl: 10 .  atorvastatin (LIPITOR) 80 MG tablet, TAKE 1 TABLET BY MOUTH ONCE DAILY, Disp: 90 tablet, Rfl: 3 .  Dulaglutide 1.5 MG/0.5ML SOPN, INJECT 1.5MG  ONCE WEEKLY, Disp: 6 mL, Rfl: 3 .  gabapentin (NEURONTIN) 300 MG capsule, Take 300 mg by mouth as needed (for nerve pain)., Disp: , Rfl:  .  metFORMIN (GLUCOPHAGE) 1000 MG tablet, Take 1,000 mg by mouth 2 (two) times daily with a meal., Disp: , Rfl:  .  metFORMIN (GLUCOPHAGE) 1000 MG tablet, TAKE 1 TABLET BY MOUTH 2 TIMES DAILY WITH MEALS, Disp: 180 tablet, Rfl: 3 .  metoprolol tartrate (LOPRESSOR) 25 MG tablet, TAKE 1 TABLET BY MOUTH TWICE DAILY, Disp: 180 tablet, Rfl: 3 .  Multiple Vitamin (MULTIVITAMIN) tablet, Take 1 tablet by mouth daily., Disp: , Rfl:  .  nitroGLYCERIN (NITROSTAT) 0.4 MG SL tablet, PLACE 1 TABLET UNDER THE TONGUE EVERY 5 MINUTES AS NEEDED, Disp: 25 tablet, Rfl: 2 .  ticagrelor (BRILINTA) 90 MG TABS tablet, TAKE 1 TABLET BY MOUTH TWICE DAILY, Disp: 180 tablet, Rfl: 3  Past Medical History: Past Medical History:  Diagnosis Date  . Coronary artery disease    11/9 NSTEMI, PCI to pLCx with aspiration thrombectomy/DESx1, occluded mRCA with left to right collaterals  .  Diabetes mellitus without complication (HCC)   . HLD (hyperlipidemia)     Tobacco Use: Social History   Tobacco Use  Smoking Status Former Smoker  . Types: Cigarettes  . Quit date: 01/16/2020  . Years since quitting: 0.4  Smokeless Tobacco Never Used    Labs: Recent Review Advice worker    Labs for ITP Cardiac and Pulmonary Rehab Latest Ref Rng & Units 01/17/2020 03/15/2020   Cholestrol 100 - 199 mg/dL 409 811   LDLCALC 0 - 99 mg/dL 81 52   HDL >91 mg/dL 47(W) 42   Trlycerides 0 - 149 mg/dL 295(A) 213   Hemoglobin A1c 4.8 - 5.6 % 7.3(H) -      Capillary Blood Glucose: Lab Results  Component Value Date   GLUCAP 85 05/17/2020   GLUCAP 104 (H) 05/17/2020   GLUCAP 96 05/15/2020   GLUCAP 149 (H) 05/15/2020   GLUCAP 141 (H) 01/19/2020     Exercise Target Goals: Exercise Program Goal: Individual exercise prescription set using results from initial 6 min walk test and THRR while considering  patient's activity barriers and safety.   Exercise Prescription Goal: Starting with aerobic activity 30 plus minutes a day, 3 days per week for initial exercise prescription. Provide home exercise prescription and guidelines that participant acknowledges understanding prior to discharge.  Activity Barriers & Risk Stratification:  Activity Barriers & Cardiac Risk Stratification - 05/11/20 1028      Activity Barriers & Cardiac Risk Stratification  Activity Barriers Joint Problems;Deconditioning;Muscular Weakness;Other (comment)    Comments S/P surgical repair of right rotator cuff x 2    Cardiac Risk Stratification High           6 Minute Walk:  6 Minute Walk    Row Name 05/11/20 0833         6 Minute Walk   Distance 1373 feet     Walk Time 6 minutes     # of Rest Breaks 0     MPH 2.6     METS 3.29     RPE 12     Perceived Dyspnea  1     VO2 Peak 11.52     Symptoms Yes (comment)     Comments SOB, RPD = 1     Resting HR 81 bpm     Resting BP 94/60     Resting Oxygen  Saturation  96 %     Exercise Oxygen Saturation  during 6 min walk 98 %     Max Ex. HR 93 bpm     Max Ex. BP 130/80     2 Minute Post BP 114/72            Oxygen Initial Assessment:   Oxygen Re-Evaluation:   Oxygen Discharge (Final Oxygen Re-Evaluation):   Initial Exercise Prescription:  Initial Exercise Prescription - 05/11/20 1000      Date of Initial Exercise RX and Referring Provider   Date 05/11/20    Referring Provider [name], MD    Expected Discharge Date 07/08/19      Bike   Level 1.5    Minutes 15    METs 2      Track   Laps 13    Minutes 15    METs 2.51      Prescription Details   Frequency (times per week) 3    Duration Progress to 30 minutes of continuous aerobic without signs/symptoms of physical distress      Intensity   THRR 40-80% of Max Heartrate 62-123    Ratings of Perceived Exertion 11-13    Perceived Dyspnea 0-4      Progression   Progression Continue progressive overload as per policy without signs/symptoms or physical distress.      Resistance Training   Training Prescription Yes   left arm only   Weight 2 lbs    Reps 10-15           Perform Capillary Blood Glucose checks as needed.  Exercise Prescription Changes:  Exercise Prescription Changes    Row Name 05/15/20 1000 05/29/20 0958 05/30/20 0900 06/12/20 1019 06/26/20 1128     Response to Exercise   Blood Pressure (Admit) 112/74 120/70 -- 136/76 118/64   Blood Pressure (Exercise) 122/72 136/78 -- 136/70 128/82   Blood Pressure (Exit) 104/64 112/70 -- 120/80 110/62   Heart Rate (Admit) 75 bpm 83 bpm -- 88 bpm 86 bpm   Heart Rate (Exercise) 97 bpm 97 bpm -- 105 bpm 108 bpm   Heart Rate (Exit) 84 bpm 82 bpm -- 88 bpm 81 bpm   Rating of Perceived Exertion (Exercise) 11 11 -- 11 11   Symptoms None None -- SOB on entry to exercise None   Comments Pt's first day of exercise in the CRP2 program Reviewed METs and Home exercise Rx -- Reviewed METs and Goals Reviewed METs    Duration Progress to 30 minutes of  aerobic without signs/symptoms of physical distress Continue with 30 min  of aerobic exercise without signs/symptoms of physical distress. -- Continue with 30 min of aerobic exercise without signs/symptoms of physical distress. Continue with 30 min of aerobic exercise without signs/symptoms of physical distress.   Intensity THRR unchanged THRR unchanged -- THRR unchanged THRR unchanged     Progression   Progression Continue to progress workloads to maintain intensity without signs/symptoms of physical distress. Continue to progress workloads to maintain intensity without signs/symptoms of physical distress. -- Continue to progress workloads to maintain intensity without signs/symptoms of physical distress. Continue to progress workloads to maintain intensity without signs/symptoms of physical distress.   Average METs 2.6 2.6 -- 3 3     Resistance Training   Training Prescription Yes  Left arm only No No Yes Yes   Weight 2 lbs -- -- 2 lbs 2 lbs   Reps 10-15 -- -- 10-15 10-15   Time 10 Minutes -- -- 10 Minutes 10 Minutes     Interval Training   Interval Training No No No No No     Bike   Level 1.5 -- -- -- --   Minutes 15 -- -- -- --   METs 2.4 -- -- -- --     NuStep   Level -- 2 -- 3 4   SPM -- 85 -- 85 85   Minutes -- 15 -- 15 15   METs -- 2.3 -- 3.3 3.3     Track   Laps 16 16 -- 14 17   Minutes 15 15 -- 15 15   METs 2.86 2.86 -- 2.62 2.97     Home Exercise Plan   Plans to continue exercise at -- Home (comment) -- Home (comment) Home (comment)   Frequency -- Add 2 additional days to program exercise sessions. -- Add 2 additional days to program exercise sessions. Add 2 additional days to program exercise sessions.   Initial Home Exercises Provided -- 05/29/20 -- 05/29/20 05/29/20          Exercise Comments:  Exercise Comments    Row Name 05/15/20 1032 05/29/20 0958 06/12/20 1029 06/26/20 1132     Exercise Comments Pt's first day of  exercise in the CRP2 program. Pt tolerated session well with no complaints. No stretching or weights with the right arm due to h/o shoulder surgery and on-going PT for this shoulder. Reviewed METs and home exercise Rx. Switched patient from bike to nustep for the comfort of her right arm/shoulder which has extensive orthopedic issues. Pt finds the nustep better on the shoulder and experiences less soreness after exercise with this modality. Pt verbalized understnading of the home exercise Rx and was provided a copy. Reviewewd METs and goals. Pt is walking several times/day with her dog and often does so in total of more than 1 hour. Reviewed METs. Pt doing well with workloads. Making progress.           Exercise Goals and Review:  Exercise Goals    Row Name 05/11/20 1030             Exercise Goals   Increase Physical Activity Yes       Intervention Provide advice, education, support and counseling about physical activity/exercise needs.;Develop an individualized exercise prescription for aerobic and resistive training based on initial evaluation findings, risk stratification, comorbidities and participant's personal goals.       Expected Outcomes Short Term: Attend rehab on a regular basis to increase amount of physical activity.;Long Term: Add in home exercise to make exercise part  of routine and to increase amount of physical activity.;Long Term: Exercising regularly at least 3-5 days a week.       Increase Strength and Stamina Yes       Intervention Provide advice, education, support and counseling about physical activity/exercise needs.;Develop an individualized exercise prescription for aerobic and resistive training based on initial evaluation findings, risk stratification, comorbidities and participant's personal goals.       Expected Outcomes Long Term: Improve cardiorespiratory fitness, muscular endurance and strength as measured by increased METs and functional capacity ( );Short  Term: Perform resistance training exercises routinely during rehab and add in resistance training at home;Short Term: Increase workloads from initial exercise prescription for resistance, speed, and METs.       Able to understand and use rate of perceived exertion (RPE) scale Yes       Intervention Provide education and explanation on how to use RPE scale       Expected Outcomes Short Term: Able to use RPE daily in rehab to express subjective intensity level;Long Term:  Able to use RPE to guide intensity level when exercising independently       Knowledge and understanding of Target Heart Rate Range (THRR) Yes       Intervention Provide education and explanation of THRR including how the numbers were predicted and where they are located for reference       Expected Outcomes Short Term: Able to state/look up THRR;Short Term: Able to use daily as guideline for intensity in rehab;Long Term: Able to use THRR to govern intensity when exercising independently       Understanding of Exercise Prescription Yes       Intervention Provide education, explanation, and written materials on patient's individual exercise prescription       Expected Outcomes Short Term: Able to explain program exercise prescription;Long Term: Able to explain home exercise prescription to exercise independently              Exercise Goals Re-Evaluation :  Exercise Goals Re-Evaluation    Row Name 05/15/20 1027 05/29/20 0958 06/12/20 1026         Exercise Goal Re-Evaluation   Exercise Goals Review Increase Physical Activity;Increase Strength and Stamina;Able to understand and use rate of perceived exertion (RPE) scale;Knowledge and understanding of Target Heart Rate Range (THRR);Understanding of Exercise Prescription Increase Physical Activity;Increase Strength and Stamina;Able to understand and use rate of perceived exertion (RPE) scale;Knowledge and understanding of Target Heart Rate Range (THRR);Able to check pulse  independently;Understanding of Exercise Prescription Increase Physical Activity;Increase Strength and Stamina;Able to understand and use rate of perceived exertion (RPE) scale;Knowledge and understanding of Target Heart Rate Range (THRR);Understanding of Exercise Prescription;Able to check pulse independently     Comments Pt's first day in the CRP2 program. Pt tolerated session well and understands the RPE scale, THRR, and Exercise Rx. Reviewed Home exercsie Rx/METs. Pt walking daily with her dog. Encouraged walking at least 2x/week for 30-45 minutes, Reviewed METs and Goals. Pt is progresing well. Vocies having more strength and stamina. Pt did voice some SOB whic is believed to be caused by the Brilinta, pt MD notified. Pt completed session with no complaint.     Expected Outcomes Will continue to monitor patients and progress exercise workloads as tolerated. Pt will conntinue to exercise at home on her own. Will continue to montior patient and progress exercise workloads as tolerated.             Discharge Exercise Prescription (Final Exercise Prescription Changes):  Exercise Prescription Changes - 06/26/20 1128      Response to Exercise   Blood Pressure (Admit) 118/64    Blood Pressure (Exercise) 128/82    Blood Pressure (Exit) 110/62    Heart Rate (Admit) 86 bpm    Heart Rate (Exercise) 108 bpm    Heart Rate (Exit) 81 bpm    Rating of Perceived Exertion (Exercise) 11    Symptoms None    Comments Reviewed METs    Duration Continue with 30 min of aerobic exercise without signs/symptoms of physical distress.    Intensity THRR unchanged      Progression   Progression Continue to progress workloads to maintain intensity without signs/symptoms of physical distress.    Average METs 3      Resistance Training   Training Prescription Yes    Weight 2 lbs    Reps 10-15    Time 10 Minutes      Interval Training   Interval Training No      NuStep   Level 4    SPM 85    Minutes 15     METs 3.3      Track   Laps 17    Minutes 15    METs 2.97      Home Exercise Plan   Plans to continue exercise at Home (comment)    Frequency Add 2 additional days to program exercise sessions.    Initial Home Exercises Provided 05/29/20           Nutrition:  Target Goals: Understanding of nutrition guidelines, daily intake of sodium 1500mg , cholesterol 200mg , calories 30% from fat and 7% or less from saturated fats, daily to have 5 or more servings of fruits and vegetables.  Biometrics:  Pre Biometrics - 05/11/20 1021      Pre Biometrics   Waist Circumference 38.25 inches    Hip Circumference 41.25 inches    Waist to Hip Ratio 0.93 %    Triceps Skinfold 29 mm    % Body Fat 38.6 %    Grip Strength 23 kg    Flexibility 14.75 in    Single Leg Stand 30 seconds            Nutrition Therapy Plan and Nutrition Goals:  Nutrition Therapy & Goals - 05/22/20 1206      Nutrition Therapy   Diet TLC; carb modified      Personal Nutrition Goals   Nutrition Goal Pt to build a healthy plate including vegetables, fruits, whole grains, and low-fat dairy products in a heart healthy meal plan.    Personal Goal #2 Pt to continue to check blood sugars with goal of Fasting CBGs between 80-130 mg/dl      Intervention Plan   Intervention Prescribe, educate and counsel regarding individualized specific dietary modifications aiming towards targeted core components such as weight, hypertension, lipid management, diabetes, heart failure and other comorbidities.;Nutrition handout(s) given to patient.    Expected Outcomes Short Term Goal: A plan has been developed with personal nutrition goals set during dietitian appointment.;Long Term Goal: Adherence to prescribed nutrition plan.           Nutrition Assessments:  MEDIFICTS Score Key:  ?70 Need to make dietary changes   40-70 Heart Healthy Diet  ? 40 Therapeutic Level Cholesterol Diet  Flowsheet Row CARDIAC REHAB PHASE II EXERCISE  from 05/22/2020 in [facility] CARDIAC REHAB  Picture Your Plate Total Score on Admission 81     Picture Your  Plate Scores:  <40 Unhealthy dietary pattern with much room for improvement.  41-50 Dietary pattern unlikely to meet recommendations for good health and room for improvement.  51-60 More healthful dietary pattern, with some room for improvement.   >60 Healthy dietary pattern, although there may be some specific behaviors that could be improved.    Nutrition Goals Re-Evaluation:  Nutrition Goals Re-Evaluation    Row Name 05/22/20 1209 06/30/20 1215           Goals   Current Weight 153 lb (69.4 kg) 156 lb 15.5 oz (71.2 kg)      Nutrition Goal Pt to build a healthy plate including vegetables, fruits, whole grains, and low-fat dairy products in a heart healthy meal plan. Pt to build a healthy plate including vegetables, fruits, whole grains, and low-fat dairy products in a heart healthy meal plan.      Comment -- Pt continues to report poor appetite. She reports CBGs WNL. Reviewed meal planning.             Personal Goal #2 Re-Evaluation   Personal Goal #2 Pt to continue to check blood sugars with goal of Fasting CBGs between 80-130 mg/dl Pt to continue to check blood sugars with goal of Fasting CBGs between 80-130 mg/dl             Nutrition Goals Discharge (Final Nutrition Goals Re-Evaluation):  Nutrition Goals Re-Evaluation - 06/30/20 1215      Goals   Current Weight 156 lb 15.5 oz (71.2 kg)    Nutrition Goal Pt to build a healthy plate including vegetables, fruits, whole grains, and low-fat dairy products in a heart healthy meal plan.    Comment Pt continues to report poor appetite. She reports CBGs WNL. Reviewed meal planning.      Personal Goal #2 Re-Evaluation   Personal Goal #2 Pt to continue to check blood sugars with goal of Fasting CBGs between 80-130 mg/dl           Psychosocial: Target Goals: Acknowledge presence or absence of  significant depression and/or stress, maximize coping skills, provide positive support system. Participant is able to verbalize types and ability to use techniques and skills needed for reducing stress and depression.  Initial Review & Psychosocial Screening:  Initial Psych Review & Screening - 05/11/20 1155      Initial Review   Current issues with Current Stress Concerns    Source of Stress Concerns Chronic Illness;Unable to perform yard/household activities;Occupation;Retirement/disability;Financial;Unable to participate in former interests or hobbies    Comments Christina Larsen had Shoulder surgery and then a MI and stent right after. Christina Larsen says she is not depressed but has been through a lot emotionally.      Family Dynamics   Good Support System? Yes   Pat lives alone. Christina Larsen has a son who lives in [place] and a neighbor and a friend who lives in [place] for support.   Comments Christina Larsen recently relocated to [facility] from [place] for work. Christina Larsen admits to feeling isolated during the pandemic      Barriers   Psychosocial barriers to participate in program The patient should benefit from training in stress management and relaxation.      Screening Interventions   Interventions To provide support and resources with identified psychosocial needs    Expected Outcomes Long Term Goal: Stressors or current issues are controlled or eliminated.;Short Term goal: Utilizing psychosocial counselor, staff and physician to assist with identification of specific Stressors or current issues interfering with healing  process. Setting desired goal for each stressor or current issue identified.;Short Term goal: Identification and review with participant of any Quality of Life or Depression concerns found by scoring the questionnaire.;Long Term goal: The participant improves quality of Life and PHQ9 Scores as seen by post scores and/or verbalization of changes           Quality of Life Scores:  Quality of Life - 05/11/20  1018      Quality of Life   Select Quality of Life      Quality of Life Scores   Health/Function Pre 19.14 %    Socioeconomic Pre 16.75 %    Psych/Spiritual Pre 18 %    Family Pre 25.2 %    GLOBAL Pre 19.39 %          Scores of 19 and below usually indicate a poorer quality of life in these areas.  A difference of  2-3 points is a clinically meaningful difference.  A difference of 2-3 points in the total score of the Quality of Life Index has been associated with significant improvement in overall quality of life, self-image, physical symptoms, and general health in studies assessing change in quality of life.  PHQ-9: Recent Review Flowsheet Data    Depression screen [facility] 2/9 05/11/2020   Decreased Interest 1   Down, Depressed, Hopeless 0   PHQ - 2 Score 1     Interpretation of Total Score  Total Score Depression Severity:  1-4 = Minimal depression, 5-9 = Mild depression, 10-14 = Moderate depression, 15-19 = Moderately severe depression, 20-27 = Severe depression   Psychosocial Evaluation and Intervention:   Psychosocial Re-Evaluation:  Psychosocial Re-Evaluation    Row Name 06/06/20 1532 07/04/20 1347           Psychosocial Re-Evaluation   Current issues with Current Stress Concerns Current Stress Concerns      Comments Pat continnues to have healtth and financial strssors Christina Larsen has voiced having less stress since she is receiving medication assistance with 2 of her medications      Expected Outcomes Christina Larsen will have decreased stress upon completion of phase 2 cardiac rehab Christina Larsen will have decreased stress upon completion of phase 2 cardiac rehab      Interventions Encouraged to attend Cardiac Rehabilitation for the exercise;Stress management education Encouraged to attend Cardiac Rehabilitation for the exercise;Stress management education      Continue Psychosocial Services  Follow up required by staff Follow up required by staff      Comments Will continue to offer support as  needed. Christina Larsen is working one day a week. Will continue to offer support as needed. Christina Larsen is working one day a week.             Initial Review   Source of Stress Concerns Chronic Illness;Unable to participate in former interests or hobbies;Retirement/disability;Occupation Chronic Illness;Unable to participate in former interests or hobbies;Retirement/disability;Occupation             Psychosocial Discharge (Final Psychosocial Re-Evaluation):  Psychosocial Re-Evaluation - 07/04/20 1347      Psychosocial Re-Evaluation   Current issues with Current Stress Concerns    Comments Christina Larsen has voiced having less stress since she is receiving medication assistance with 2 of her medications    Expected Outcomes Christina Larsen will have decreased stress upon completion of phase 2 cardiac rehab    Interventions Encouraged to attend Cardiac Rehabilitation for the exercise;Stress management education    Continue Psychosocial Services  Follow up required by  staff    Comments Will continue to offer support as needed. Christina Larsen is working one day a week.      Initial Review   Source of Stress Concerns Chronic Illness;Unable to participate in former interests or hobbies;Retirement/disability;Occupation           Vocational Rehabilitation: Provide vocational rehab assistance to qualifying candidates.   Vocational Rehab Evaluation & Intervention:  Vocational Rehab - 05/11/20 1207      Initial Vocational Rehab Evaluation & Intervention   Assessment shows need for Vocational Rehabilitation No   Christina Larsen is semi retired and does not need vocational rehab at this time.          Education: Education Goals: Education classes will be provided on a weekly basis, covering required topics. Participant will state understanding/return demonstration of topics presented.  Learning Barriers/Preferences:  Learning Barriers/Preferences - 05/11/20 1023      Learning Barriers/Preferences   Learning Barriers Sight   waers glasses    Learning Preferences Video;Pictoral;Individual Instruction;Skilled Demonstration;Computer/Internet           Education Topics: Hypertension, Hypertension Reduction -Define heart disease and high blood pressure. Discus how high blood pressure affects the body and ways to reduce high blood pressure.   Exercise and Your Heart -Discuss why it is important to exercise, the FITT principles of exercise, normal and abnormal responses to exercise, and how to exercise safely.   Angina -Discuss definition of angina, causes of angina, treatment of angina, and how to decrease risk of having angina.   Cardiac Medications -Review what the following cardiac medications are used for, how they affect the body, and side effects that may occur when taking the medications.  Medications include Aspirin, Beta blockers, calcium channel blockers, ACE Inhibitors, angiotensin receptor blockers, diuretics, digoxin, and antihyperlipidemics.   Congestive Heart Failure -Discuss the definition of CHF, how to live with CHF, the signs and symptoms of CHF, and how keep track of weight and sodium intake.   Heart Disease and Intimacy -Discus the effect sexual activity has on the heart, how changes occur during intimacy as we age, and safety during sexual activity.   Smoking Cessation / COPD -Discuss different methods to quit smoking, the health benefits of quitting smoking, and the definition of COPD.   Nutrition I: Fats -Discuss the types of cholesterol, what cholesterol does to the heart, and how cholesterol levels can be controlled.   Nutrition II: Labels -Discuss the different components of food labels and how to read food label   Heart Parts/Heart Disease and PAD -Discuss the anatomy of the heart, the pathway of blood circulation through the heart, and these are affected by heart disease.   Stress I: Signs and Symptoms -Discuss the causes of stress, how stress may lead to anxiety and depression, and  ways to limit stress.   Stress II: Relaxation -Discuss different types of relaxation techniques to limit stress.   Warning Signs of Stroke / TIA -Discuss definition of a stroke, what the signs and symptoms are of a stroke, and how to identify when someone is having stroke.   Knowledge Questionnaire Score:  Knowledge Questionnaire Score - 05/11/20 1018      Knowledge Questionnaire Score   Pre Score 21/24           Core Components/Risk Factors/Patient Goals at Admission:  Personal Goals and Risk Factors at Admission - 05/11/20 1024      Core Components/Risk Factors/Patient Goals on Admission    Weight Management Weight Maintenance;Yes  Intervention Weight Management: Develop a combined nutrition and exercise program designed to reach desired caloric intake, while maintaining appropriate intake of nutrient and fiber, sodium and fats, and appropriate energy expenditure required for the weight goal.;Weight Management: Provide education and appropriate resources to help participant work on and attain dietary goals.    Expected Outcomes Short Term: Continue to assess and modify interventions until short term weight is achieved;Long Term: Adherence to nutrition and physical activity/exercise program aimed toward attainment of established weight goal;Weight Maintenance: Understanding of the daily nutrition guidelines, which includes 25-35% calories from fat, 7% or less cal from saturated fats, less than 200mg  cholesterol, less than 1.5gm of sodium, & 5 or more servings of fruits and vegetables daily;Understanding recommendations for meals to include 15-35% energy as protein, 25-35% energy from fat, 35-60% energy from carbohydrates, less than 200mg  of dietary cholesterol, 20-35 gm of total fiber daily    Diabetes Yes    Intervention Provide education about signs/symptoms and action to take for hypo/hyperglycemia.;Provide education about proper nutrition, including hydration, and  aerobic/resistive exercise prescription along with prescribed medications to achieve blood glucose in normal ranges: Fasting glucose 65-99 mg/dL    Expected Outcomes Short Term: Participant verbalizes understanding of the signs/symptoms and immediate care of hyper/hypoglycemia, proper foot care and importance of medication, aerobic/resistive exercise and nutrition plan for blood glucose control.;Long Term: Attainment of HbA1C < 7%.    Hypertension Yes    Intervention Provide education on lifestyle modifcations including regular physical activity/exercise, weight management, moderate sodium restriction and increased consumption of fresh fruit, vegetables, and low fat dairy, alcohol moderation, and smoking cessation.;Monitor prescription use compliance.    Expected Outcomes Short Term: Continued assessment and intervention until BP is < 140/53mm HG in hypertensive participants. < 130/61mm HG in hypertensive participants with diabetes, heart failure or chronic kidney disease.;Long Term: Maintenance of blood pressure at goal levels.    Lipids Yes    Intervention Provide education and support for participant on nutrition & aerobic/resistive exercise along with prescribed medications to achieve LDL 70mg , HDL >40mg .    Expected Outcomes Short Term: Participant states understanding of desired cholesterol values and is compliant with medications prescribed. Participant is following exercise prescription and nutrition guidelines.;Long Term: Cholesterol controlled with medications as prescribed, with individualized exercise RX and with personalized nutrition plan. Value goals: LDL < 70mg , HDL > 40 mg.    Stress Yes    Intervention Offer individual and/or small group education and counseling on adjustment to heart disease, stress management and health-related lifestyle change. Teach and support self-help strategies.;Refer participants experiencing significant psychosocial distress to appropriate mental health  specialists for further evaluation and treatment. When possible, include family members and significant others in education/counseling sessions.    Expected Outcomes Short Term: Participant demonstrates changes in health-related behavior, relaxation and other stress management skills, ability to obtain effective social support, and compliance with psychotropic medications if prescribed.;Long Term: Emotional wellbeing is indicated by absence of clinically significant psychosocial distress or social isolation.           Core Components/Risk Factors/Patient Goals Review:   Goals and Risk Factor Review    Row Name 06/06/20 1536 07/04/20 1349           Core Components/Risk Factors/Patient Goals Review   Personal Goals Review Weight Management/Obesity;Stress;Hypertension;Diabetes;Lipids Weight Management/Obesity;Stress;Hypertension;Diabetes;Lipids      Review has been doing fair with exercise despite her limitations form her recent shoulder surgery. Vital signs and CBG's have been stable 06/08/20 has been doing well  with exercise despite her limitations form her recent shoulder surgery. Vital signs and CBG's have been stable. Christina Larsen will complete exercise on 07/12/20      Expected Outcomes Christina Larsen will continue to participate in phase 2 cardiac rehab for exercise nutrition and lifestyle modifications Christina Larsen will continue to participate in phase 2 cardiac rehab for exercise nutrition and lifestyle modifications             Core Components/Risk Factors/Patient Goals at Discharge (Final Review):   Goals and Risk Factor Review - 07/04/20 1349      Core Components/Risk Factors/Patient Goals Review   Personal Goals Review Weight Management/Obesity;Stress;Hypertension;Diabetes;Lipids    Review Christina Larsen has been doing well with exercise despite her limitations form her recent shoulder surgery. Vital signs and CBG's have been stable. Christina Larsen will complete exercise on 07/12/20    Expected Outcomes Christina Larsen will continue to  participate in phase 2 cardiac rehab for exercise nutrition and lifestyle modifications           ITP Comments:  ITP Comments    Row Name 05/11/20 1610 06/06/20 1530 07/04/20 1347       ITP Comments Dr Armanda Magic MD, Medical Director 30 Day ITP Review. Christina Larsen has good attendance and participation in phase 2 cardiac rehab 30 Day ITP Review. Christina Larsen continues to have  good attendance and participation in phase 2 cardiac rehab            Comments: See ITP comments.Gladstone Lighter, RN,BSN 07/04/2020 1:51 PM

## 2020-07-05 ENCOUNTER — Encounter (HOSPITAL_COMMUNITY)
Admission: RE | Admit: 2020-07-05 | Discharge: 2020-07-05 | Disposition: A | Discharge status: Home / Self Care | Payer: PPO | Source: Ambulatory Visit | Attending: Cardiovascular Disease | Admitting: Cardiovascular Disease

## 2020-07-05 ENCOUNTER — Other Ambulatory Visit: Payer: Self-pay

## 2020-07-05 VITALS — Ht 65.75 in | Wt 157.4 lb

## 2020-07-05 DIAGNOSIS — I214 Non-ST elevation (NSTEMI) myocardial infarction: Secondary | ICD-10-CM

## 2020-07-05 DIAGNOSIS — Z955 Presence of coronary angioplasty implant and graft: Secondary | ICD-10-CM

## 2020-07-07 ENCOUNTER — Encounter (HOSPITAL_COMMUNITY): Payer: PPO

## 2020-07-10 ENCOUNTER — Other Ambulatory Visit: Payer: Self-pay

## 2020-07-10 ENCOUNTER — Encounter (HOSPITAL_COMMUNITY)
Admission: RE | Admit: 2020-07-10 | Discharge: 2020-07-10 | Disposition: A | Discharge status: Home / Self Care | Payer: PPO | Source: Ambulatory Visit | Attending: Cardiovascular Disease | Admitting: Cardiovascular Disease

## 2020-07-10 ENCOUNTER — Other Ambulatory Visit (HOSPITAL_BASED_OUTPATIENT_CLINIC_OR_DEPARTMENT_OTHER): Payer: Self-pay

## 2020-07-10 DIAGNOSIS — I214 Non-ST elevation (NSTEMI) myocardial infarction: Secondary | ICD-10-CM | POA: Insufficient documentation

## 2020-07-10 DIAGNOSIS — Z955 Presence of coronary angioplasty implant and graft: Secondary | ICD-10-CM | POA: Insufficient documentation

## 2020-07-10 MED FILL — Metoprolol Tartrate Tab 25 MG: ORAL | 90 days supply | Qty: 180 | Fill #0 | Status: AC

## 2020-07-12 ENCOUNTER — Other Ambulatory Visit: Payer: Self-pay

## 2020-07-12 ENCOUNTER — Encounter (HOSPITAL_COMMUNITY)
Admission: RE | Admit: 2020-07-12 | Discharge: 2020-07-12 | Disposition: A | Discharge status: Home / Self Care | Payer: PPO | Source: Ambulatory Visit | Attending: Cardiovascular Disease | Admitting: Cardiovascular Disease

## 2020-07-12 DIAGNOSIS — Z955 Presence of coronary angioplasty implant and graft: Secondary | ICD-10-CM

## 2020-07-12 DIAGNOSIS — I214 Non-ST elevation (NSTEMI) myocardial infarction: Secondary | ICD-10-CM | POA: Diagnosis not present

## 2020-07-12 NOTE — Progress Notes (Signed)
Discharge Progress Report  Patient Details  Name: Christina Larsen MRN: [id number] Date of Birth: [birth year] Referring Provider:   Flowsheet Row CARDIAC REHAB PHASE II ORIENTATION from 05/11/2020 in [place]  Referring Provider Shelva Majestic, MD        Number of Visits: 17  Reason for Discharge:  Patient reached a stable level of exercise. Patient independent in their exercise. Patient has met program and personal goals.  Smoking History:  Social History   Tobacco Use  Smoking Status Former Smoker  . Types: Cigarettes  . Quit date: 01/16/2020  . 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  ADL UCSD:    Initial Exercise Prescription:  Initial Exercise Prescription - 05/11/20 1000       Date of Initial Exercise RX and Referring Provider   Date 05/11/20    Referring Provider Shelva Majestic, MD    Expected Discharge Date 07/08/19      Bike   Level 1.5    Minutes 15    METs 2      Track   Laps 13    Minutes 15    METs 2.51      Prescription Details   Frequency (times per week) 3    Duration Progress to 30 minutes of continuous aerobic without signs/symptoms of physical distress      Intensity   THRR 40-80% of Max Heartrate 62-123    Ratings of Perceived Exertion 11-13    Perceived Dyspnea 0-4      Progression   Progression Continue progressive overload as per policy without signs/symptoms or physical distress.      Resistance Training   Training Prescription Yes   left arm only   Weight 2 lbs    Reps 10-15             Discharge Exercise Prescription (Final Exercise Prescription Changes):  Exercise Prescription Changes - 07/12/20 1200       Response to Exercise   Blood Pressure (Admit) 118/78    Blood Pressure (Exercise) 118/78    Blood Pressure (Exit) 110/68    Heart Rate (Admit) 79 bpm    Heart Rate (Exercise) 105 bpm    Heart Rate (Exit) 82 bpm     Rating of Perceived Exertion (Exercise) 11    Symptoms None    Comments Pt graduated from the [facility] program today    Duration Continue with 30 min of aerobic exercise without signs/symptoms of physical distress.    Intensity THRR unchanged      Progression   Progression Continue to progress workloads to maintain intensity without signs/symptoms of physical distress.    Average METs 3.5      Resistance Training   Training Prescription No    Weight No weights on Wednesdays      Interval Training   Interval Training No      NuStep   Level 4    SPM 100    Minutes 15    METs 4.5      Track   Laps 17    Minutes 15    METs 2.97      Home Exercise Plan   Plans to continue exercise at Home (comment)    Frequency Add 2 additional days to program exercise sessions.    Initial Home Exercises Provided 05/29/20             Functional  Capacity:  6 Minute Walk     Row Name 05/11/20 [phone] 07/05/20 0853       6 Minute Walk   Phase -- Discharge    Distance 1373 feet 1735 feet    Distance % Change -- 26.37 %    Distance Feet Change -- 362 ft    Walk Time 6 minutes 6 minutes    # of Rest Breaks 0 0    MPH 2.6 3.3    METS 3.29 4    RPE 12 11    Perceived Dyspnea  1 0    VO2 Peak 11.52 14    Symptoms Yes (comment) No    Comments SOB, RPD = 1 --    Resting HR 81 bpm 84 bpm    Resting BP 94/60 120/62    Resting Oxygen Saturation  96 % 97 %    Exercise Oxygen Saturation  during 6 min walk 98 % 97 %    Max Ex. HR 93 bpm 99 bpm    Max Ex. BP 130/80 132/80    2 Minute Post BP 114/72 --             Psychological, QOL, Others - Outcomes: PHQ 2/9: Depression screen [facility] 2/9 07/12/2020 05/11/2020  Decreased Interest 0 1  Down, Depressed, Hopeless 0 0  PHQ - 2 Score 0 1    Quality of Life:  Quality of Life - 07/03/20 1423       Quality of Life Scores   Health/Function Post 23.93 %    Socioeconomic Post 23.38 %    Psych/Spiritual Post 22.82 %    Family Post 25.31 %     GLOBAL Post 23.73 %             Personal Goals: Goals established at orientation with interventions provided to work toward goal.  Personal Goals and Risk Factors at Admission - 05/11/20 1024       Core Components/Risk Factors/Patient Goals on Admission    Weight Management Weight Maintenance;Yes    Intervention Weight Management: Develop a combined nutrition and exercise program designed to reach desired caloric intake, while maintaining appropriate intake of nutrient and fiber, sodium and fats, and appropriate energy expenditure required for the weight goal.;Weight Management: Provide education and appropriate resources to help participant work on and attain dietary goals.    Expected Outcomes Short Term: Continue to assess and modify interventions until short term weight is achieved;Long Term: Adherence to nutrition and physical activity/exercise program aimed toward attainment of established weight goal;Weight Maintenance: Understanding of the daily nutrition guidelines, which includes 25-35% calories from fat, 7% or less cal from saturated fats, less than 214m cholesterol, less than 1.5gm of sodium, & 5 or more servings of fruits and vegetables daily;Understanding recommendations for meals to include 15-35% energy as protein, 25-35% energy from fat, 35-60% energy from carbohydrates, less than 2048mof dietary cholesterol, 20-35 gm of total fiber daily    Diabetes Yes    Intervention Provide education about signs/symptoms and action to take for hypo/hyperglycemia.;Provide education about proper nutrition, including hydration, and aerobic/resistive exercise prescription along with prescribed medications to achieve blood glucose in normal ranges: Fasting glucose 65-99 mg/dL    Expected Outcomes Short Term: Participant verbalizes understanding of the signs/symptoms and immediate care of hyper/hypoglycemia, proper foot care and importance of medication, aerobic/resistive exercise and nutrition  plan for blood glucose control.;Long Term: Attainment of HbA1C < 7%.    Hypertension Yes    Intervention Provide education on  lifestyle modifcations including regular physical activity/exercise, weight management, moderate sodium restriction and increased consumption of fresh fruit, vegetables, and low fat dairy, alcohol moderation, and smoking cessation.;Monitor prescription use compliance.    Expected Outcomes Short Term: Continued assessment and intervention until BP is < 140/81m HG in hypertensive participants. < 130/852mHG in hypertensive participants with diabetes, heart failure or chronic kidney disease.;Long Term: Maintenance of blood pressure at goal levels.    Lipids Yes    Intervention Provide education and support for participant on nutrition & aerobic/resistive exercise along with prescribed medications to achieve LDL <7089mHDL >8m86m  Expected Outcomes Short Term: Participant states understanding of desired cholesterol values and is compliant with medications prescribed. Participant is following exercise prescription and nutrition guidelines.;Long Term: Cholesterol controlled with medications as prescribed, with individualized exercise RX and with personalized nutrition plan. Value goals: LDL < 70mg35mL > 40 mg.    Stress Yes    Intervention Offer individual and/or small group education and counseling on adjustment to heart disease, stress management and health-related lifestyle change. Teach and support self-help strategies.;Refer participants experiencing significant psychosocial distress to appropriate mental health specialists for further evaluation and treatment. When possible, include family members and significant others in education/counseling sessions.    Expected Outcomes Short Term: Participant demonstrates changes in health-related behavior, relaxation and other stress management skills, ability to obtain effective social support, and compliance with psychotropic medications  if prescribed.;Long Term: Emotional wellbeing is indicated by absence of clinically significant psychosocial distress or social isolation.              Personal Goals Discharge:  Goals and Risk Factor Review     Row Name 06/06/20 1536 07/04/20 1349 07/12/20 0913         Core Components/Risk Factors/Patient Goals Review   Personal Goals Review Weight Management/Obesity;Stress;Hypertension;Diabetes;Lipids Weight Management/Obesity;Stress;Hypertension;Diabetes;Lipids Weight Management/Obesity;Stress;Hypertension;Diabetes;Lipids     Review Pat hFraser Dinbeen doing fair with exercise despite her limitations form her recent shoulder surgery. Vital signs and CBG's have been stable Pat hFraser Dinbeen doing well with exercise despite her limitations form her recent shoulder surgery. Vital signs and CBG's have been stable. Pat wFraser Din complete exercise on 07/12/20 Pat hFraser Dinbeen doing well with exercise despite her limitations form her recent shoulder surgery. Vital signs and CBG's have been stable. Pat wFraser Din complete exercise on 07/12/20     Expected Outcomes Pat wFraser Din continue to participate in phase 2 cardiac rehab for exercise nutrition and lifestyle modifications Pat wFraser Din continue to participate in phase 2 cardiac rehab for exercise nutrition and lifestyle modifications Pat wFraser Din continue  exercise at [employer] follow nutrition and lifestyle modifications upon completion of phase 2 cardiac rehab.              Exercise Goals and Review:  Exercise Goals     Row Name 05/11/20 1030             Exercise Goals   Increase Physical Activity Yes       Intervention Provide advice, education, support and counseling about physical activity/exercise needs.;Develop an individualized exercise prescription for aerobic and resistive training based on initial evaluation findings, risk stratification, comorbidities and participant's personal goals.       Expected Outcomes Short Term: Attend rehab on a regular basis to  increase amount of physical activity.;Long Term: Add in home exercise to make exercise part of routine and to increase amount of physical activity.;Long Term: Exercising regularly at least 3-5 days a week.  Increase Strength and Stamina Yes       Intervention Provide advice, education, support and counseling about physical activity/exercise needs.;Develop an individualized exercise prescription for aerobic and resistive training based on initial evaluation findings, risk stratification, comorbidities and participant's personal goals.       Expected Outcomes Long Term: Improve cardiorespiratory fitness, muscular endurance and strength as measured by increased METs and functional capacity (6MWT);Short Term: Perform resistance training exercises routinely during rehab and add in resistance training at home;Short Term: Increase workloads from initial exercise prescription for resistance, speed, and METs.       Able to understand and use rate of perceived exertion (RPE) scale Yes       Intervention Provide education and explanation on how to use RPE scale       Expected Outcomes Short Term: Able to use RPE daily in rehab to express subjective intensity level;Long Term:  Able to use RPE to guide intensity level when exercising independently       Knowledge and understanding of Target Heart Rate Range (THRR) Yes       Intervention Provide education and explanation of THRR including how the numbers were predicted and where they are located for reference       Expected Outcomes Short Term: Able to state/look up THRR;Short Term: Able to use daily as guideline for intensity in rehab;Long Term: Able to use THRR to govern intensity when exercising independently       Understanding of Exercise Prescription Yes       Intervention Provide education, explanation, and written materials on patient's individual exercise prescription       Expected Outcomes Short Term: Able to explain program exercise prescription;Long  Term: Able to explain home exercise prescription to exercise independently                Exercise Goals Re-Evaluation:  Exercise Goals Re-Evaluation     Row Name 05/15/20 1027 05/29/20 0958 06/12/20 1026 07/10/20 1029 07/12/20 1212     Exercise Goal Re-Evaluation   Exercise Goals Review Increase Physical Activity;Increase Strength and Stamina;Able to understand and use rate of perceived exertion (RPE) scale;Knowledge and understanding of Target Heart Rate Range (THRR);Understanding of Exercise Prescription Increase Physical Activity;Increase Strength and Stamina;Able to understand and use rate of perceived exertion (RPE) scale;Knowledge and understanding of Target Heart Rate Range (THRR);Able to check pulse independently;Understanding of Exercise Prescription Increase Physical Activity;Increase Strength and Stamina;Able to understand and use rate of perceived exertion (RPE) scale;Knowledge and understanding of Target Heart Rate Range (THRR);Understanding of Exercise Prescription;Able to check pulse independently Increase Physical Activity;Increase Strength and Stamina;Able to understand and use rate of perceived exertion (RPE) scale;Knowledge and understanding of Target Heart Rate Range (THRR);Able to check pulse independently;Understanding of Exercise Prescription Increase Physical Activity;Increase Strength and Stamina;Able to understand and use rate of perceived exertion (RPE) scale;Knowledge and understanding of Target Heart Rate Range (THRR);Able to check pulse independently;Understanding of Exercise Prescription   Comments Pt's first day in the [facility]2 program. Pt tolerated session well and understands the RPE scale, THRR, and Exercise Rx. Reviewed Home exercsie Rx/METs. Pt walking daily with her dog. Encouraged walking at least 2x/week for 30-45 minutes, Reviewed METs and Goals. Pt is progresing well. Vocies having more strength and stamina. Pt did voice some SOB whic is believed to be caused by  the [place], pt MD notified. Pt completed session with no complaint. Reviewed METs and goals. Pt has made good progress to her goal of having more strength and stamina. Pt  has plans to check out a gym near her home today to continue her exercise at once she graduates. Pt graduated from the [facility] program today. Pt had an average MET level of 3.5. Pt plans to continue her exercise at [facility] through the [employer] program. Pt will walk/use treadmill.   Expected Outcomes Will continue to monitor patients and progress exercise workloads as tolerated. Pt will conntinue to exercise at home on her own. Will continue to montior patient and progress exercise workloads as tolerated. Will continue to montior patient and progress exercise workloads as tolerated. Pt will continue to exercise at home on her own.            Nutrition & Weight - Outcomes:  Pre Biometrics - 05/11/20 1021       Pre Biometrics   Waist Circumference 38.25 inches    Hip Circumference 41.25 inches    Waist to Hip Ratio 0.93 %    Triceps Skinfold 29 mm    % Body Fat 38.6 %    Grip Strength 23 kg    Flexibility 14.75 in    Single Leg Stand 30 seconds             Post Biometrics - 07/05/20 1023        Post  Biometrics   Height 5' 5.75" (1.67 m)    Weight 71.4 kg    Waist Circumference 38.25 inches    Hip Circumference 41.5 inches    Waist to Hip Ratio 0.92 %    BMI (Calculated) 25.6    Triceps Skinfold 25 mm    % Body Fat 38.1 %    Grip Strength 18 kg    Flexibility 14.5 in    Single Leg Stand 20.8 seconds             Nutrition:  Nutrition Therapy & Goals - 05/22/20 1206       Nutrition Therapy   Diet TLC; carb modified      Personal Nutrition Goals   Nutrition Goal Pt to build a healthy plate including vegetables, fruits, whole grains, and low-fat dairy products in a heart healthy meal plan.    Personal Goal #2 Pt to continue to check blood sugars with goal of Fasting CBGs between 80-130  mg/dl      Intervention Plan   Intervention Prescribe, educate and counsel regarding individualized specific dietary modifications aiming towards targeted core components such as weight, hypertension, lipid management, diabetes, heart failure and other comorbidities.;Nutrition handout(s) given to patient.    Expected Outcomes Short Term Goal: A plan has been developed with personal nutrition goals set during dietitian appointment.;Long Term Goal: Adherence to prescribed nutrition plan.             Nutrition Discharge:    Education Questionnaire Score:  Knowledge Questionnaire Score - 07/03/20 1424       Knowledge Questionnaire Score   Post Score 24/24             Goals reviewed with patient; copy given to patient.Pt graduated from cardiac rehab program on 07/12/20 with completion of 17 exercise sessions in Phase II. Pt maintained good attendance and progressed nicely during his participation in rehab as evidenced by increased MET level.   Medication list reconciled. Repeat  PHQ score- 0 .  Pt has made significant lifestyle changes and should be commended for her success. Pt feels she has achieved her goals during cardiac rehab.   Pt plans to continue exercise at [facility]  and walking. Pat increased her distance on her post exercise walk test by 362 feet! Fraser Din says that she feels stronger since participating in phase 2 cardiac rehab. We are proud of Pat's progress!Barnet Pall, RN,BSN 07/25/2020 8:04 AM.

## 2020-07-24 ENCOUNTER — Encounter: Payer: Self-pay | Admitting: Cardiovascular Disease

## 2020-07-24 ENCOUNTER — Ambulatory Visit: Payer: PPO | Admitting: Cardiovascular Disease

## 2020-07-24 ENCOUNTER — Other Ambulatory Visit: Payer: Self-pay

## 2020-07-24 DIAGNOSIS — E785 Hyperlipidemia, unspecified: Secondary | ICD-10-CM

## 2020-07-24 DIAGNOSIS — R0789 Other chest pain: Secondary | ICD-10-CM

## 2020-07-24 DIAGNOSIS — I214 Non-ST elevation (NSTEMI) myocardial infarction: Secondary | ICD-10-CM

## 2020-07-24 DIAGNOSIS — Z87891 Personal history of nicotine dependence: Secondary | ICD-10-CM

## 2020-07-24 DIAGNOSIS — Z72820 Sleep deprivation: Secondary | ICD-10-CM | POA: Diagnosis not present

## 2020-07-24 DIAGNOSIS — I10 Essential (primary) hypertension: Secondary | ICD-10-CM | POA: Diagnosis not present

## 2020-07-24 DIAGNOSIS — G4719 Other hypersomnia: Secondary | ICD-10-CM

## 2020-07-24 DIAGNOSIS — E118 Type 2 diabetes mellitus with unspecified complications: Secondary | ICD-10-CM

## 2020-07-24 NOTE — Patient Instructions (Signed)
Medication Instructions:  Your physician recommends that you continue on your current medications as directed. Please refer to the Current Medication list given to you today.  *If you need a refill on your cardiac medications before your next appointment, please call your pharmacy*   Lab Work: None ordered  If you have labs (blood work) drawn today and your tests are completely normal, you will receive your results only by: Marland Kitchen MyChart Message (if you have MyChart) OR . A paper copy in the mail If you have any lab test that is abnormal or we need to change your treatment, we will call you to review the results.   Testing/Procedures: Your physician has recommended that you have a sleep study. This test records several body functions during sleep, including: brain activity, eye movement, oxygen and carbon dioxide blood levels, heart rate and rhythm, breathing rate and rhythm, the flow of air through your mouth and nose, snoring, body muscle movements, and chest and belly movement.     Follow-Up: At [facility], you and your health needs are our priority.  As part of our continuing mission to provide you with exceptional heart care, we have created designated Provider Care Teams.  These Care Teams include your primary Cardiologist (physician) and Advanced Practice Providers (APPs -  Physician Assistants and Nurse Practitioners) who all work together to provide you with the care you need, when you need it.  We recommend signing up for the patient portal called "MyChart".  Sign up information is provided on this After Visit Summary.  MyChart is used to connect with patients for Virtual Visits (Telemedicine).  Patients are able to view lab/test results, encounter notes, upcoming appointments, etc.  Non-urgent messages can be sent to your provider as well.   To learn more about what you can do with MyChart, go to [email or web address].    Your next appointment:   4 month(s)  The format for  your next appointment:   In Person  Provider:   [name], MD

## 2020-07-24 NOTE — Progress Notes (Signed)
Cardiology Office Note    Date:  07/30/2020   ID:  Christina Larsen, DOB Dec 21, 1955, MRN [id number]  PCP:  Burnard Bunting, MD  Cardiologist:  Shelva Majestic, MD   4 month  F/U office visit with me following her non-STEMI and hospitalization  History of Present Illness:  Christina Larsen is a 65 y.o. female who is originally from [place].  She has a history of diabetes mellitus, hypertension, hyperlipidemia, and tobacco use.  She had undergone initial rotator cuff surgery in June 2021 and underwent a redo surgery at the end of October.  While watching [facility], she had developed significant chest pain and presented to [facility] where ECG showed anteroseptal ST depression.  Troponin was elevated to 85 > 860.  She was given aspirin and started on heparin and nitroglycerin.  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  She required aspiration thrombectomy for significant clot burden and underwent successful stenting of her proximal circumflex vessel.  There was distal circumflex disease treated medically.  I had seen her during her hospitalization.  We had a lengthy discussion regarding absolute smoking cessation and since her presentation she has not had any further cigarettes.  She has been undergoing physical therapy for her redo shoulder surgery.  She was seen for initial post hospital follow-up on November 30 by Christina Humble, NP.  I saw her for my initial office evaluation on March 14, 2020.  At that time she was feeling well and denied recurrent chest pain symptomatology and was unaware of palpitations.  She continued to be on aspirin and Brilinta for DAPT for minimum of 1 year, and is on metoprolol  tartrate 25 mg twice a day.  She was tolerating atorvastatin 80 mg daily and was on metformin and Trulicity for her diabetes mellitus.  She has peripheral neuropathy and is on gabapentin.    She presented to the emergency room on May 07, 2020 with complaints of intermittent chest tightness.  Her chest pain was intermittent and not exertionally precipitated.  Troponins were negative.  Chest x-ray did not reveal acute abnormalities.  The chest pain was a stabbing pain to the right shoulder and was not felt to be ischemic in etiology.  She was subsequently evaluated by Christina Larsen on May 09, 2020 and remained stable.  Presently, she has felt well.  On May 12 while walking her dog she had an episode of vertigo.  She is unaware of palpitations.  She admits to poor sleep.  She also has experienced fatigue and daytime sleepiness.  An Epworth Sleepiness Scale score was therefore calculated in the office today and this endorsed at 16 consistent with excessive daytime sleepiness.  She has been without anginal symptoms.  She completed cardiac rehab.  She is now participating in [place] sneaker program at the gym.  She presents for evaluation   Past Medical History:  Diagnosis Date  . Coronary artery disease    11/9 NSTEMI, PCI to pLCx with aspiration thrombectomy/DESx1, occluded mRCA with left to right collaterals  . Diabetes mellitus without complication ([place])   . HLD (hyperlipidemia)     Past Surgical History:  Procedure Laterality Date  . CARDIAC CATHETERIZATION    . CHOLECYSTECTOMY  1992  .  CORONARY STENT INTERVENTION N/A 01/17/2020   Procedure: CORONARY STENT INTERVENTION;  Surgeon: Nelva Bush, MD;  Location: [facility];  Service: Cardiovascular;  Laterality: N/A;  . LAMINECTOMY  2006  . LEFT HEART CATH AND CORONARY ANGIOGRAPHY N/A 01/17/2020   Procedure: LEFT HEART CATH AND CORONARY ANGIOGRAPHY;  Surgeon: Nelva Bush, MD;  Location: [facility];  Service:  Cardiovascular;  Laterality: N/A;  . rotator cuff surgery      Current Medications: Outpatient Medications Prior to Visit  Medication Sig Dispense Refill  . aspirin 81 MG EC tablet TAKE 1 TABLET (81 MG TOTAL) BY MOUTH DAILY. SWALLOW WHOLE. (Patient taking differently: Take by mouth 5 (five) times daily. SWALLOW WHOLE.) 30 tablet 10  . atorvastatin (LIPITOR) 80 MG tablet TAKE 1 TABLET BY MOUTH ONCE DAILY 90 tablet 3  . Dulaglutide 1.5 MG/0.5ML SOPN INJECT 1.$RemoveBefor'5MG'[id number]$  ONCE WEEKLY 6 mL 3  . gabapentin (NEURONTIN) 300 MG capsule Take 300 mg by mouth as needed (for nerve pain).    . metFORMIN (GLUCOPHAGE) 1000 MG tablet TAKE 1 TABLET BY MOUTH 2 TIMES DAILY WITH MEALS 180 tablet 3  . metoprolol tartrate (LOPRESSOR) 25 MG tablet TAKE 1 TABLET BY MOUTH TWICE DAILY 180 tablet 3  . Multiple Vitamin (MULTIVITAMIN) tablet Take 1 tablet by mouth daily.    . nitroGLYCERIN (NITROSTAT) 0.4 MG SL tablet PLACE 1 TABLET UNDER THE TONGUE EVERY 5 MINUTES AS NEEDED 25 tablet 2  . ticagrelor (BRILINTA) 90 MG TABS tablet TAKE 1 TABLET BY MOUTH TWICE DAILY 180 tablet 3   No facility-administered medications prior to visit.     Allergies:   Hydrocodone-acetaminophen   Social History   Socioeconomic History  . Marital status: Widowed    Spouse name: Not on file  . Number of children: 1  . Years of education: 1  . Highest education level: Not on file  Occupational History  . Occupation: [employer]  Tobacco Use  . Smoking status: Former Smoker    Types: Cigarettes    Quit date: 01/16/2020    Years since quitting: 0.5  . Smokeless tobacco: Never Used  Vaping Use  . Vaping Use: Never used  Substance and Sexual Activity  . Alcohol use: Yes    Alcohol/week: 4.0 standard drinks    Types: 4 Glasses of wine per week  . Drug use: Never  . Sexual activity: Not Currently  Other Topics Concern  . Not on file  Social History Narrative   Semi Retired   Investment banker, operational of Radio broadcast assistant  Strain: Not on [employer] Insecurity: Not on file  Transportation Needs: Not on file  Physical Activity: Not on file  Stress: Not on file  Social Connections: Not on file    Socially she lives by herself.  She quit smoking the day of her heart attack.  She had been divorced from her husband who subsequently has died.  She has children.  Family History:  The patient's  family history includes Colon cancer in her mother; Diabetes in her mother; Hypercholesterolemia in her father; Hypertension in her brother; Prostate cancer in her father.   ROS General: Negative; No fevers, chills, or night sweats;  HEENT: Negative; No changes in vision or hearing, sinus congestion, difficulty swallowing Pulmonary: Negative; No cough, wheezing, shortness of breath, hemoptysis Cardiovascular: See HPI GI: Negative; No nausea, vomiting, diarrhea, or abdominal pain GU: Negative; No dysuria, hematuria, or difficulty voiding Musculoskeletal: Negative; no myalgias, joint pain, or weakness Hematologic/Oncology:  Negative; no easy bruising, bleeding Endocrine: Negative; no heat/cold intolerance; no diabetes Neuro: Negative; no changes in balance, headaches Skin: Negative; No rashes or skin lesions Psychiatric: Negative; No behavioral problems, depression Sleep: Poor sleep, fatigue, daytime sleepiness, snoring; no bruxism, restless legs, hypnogognic hallucinations, no cataplexy Other comprehensive 14 point system review is negative.   PHYSICAL EXAM:   VS:  BP (!) 142/82   Pulse 72   Ht _0  (1.651 m)   Wt 157 lb (71.2 kg)   SpO2 98%   BMI 26.13 kg/m     Repeat blood pressure by me was 122/78  Wt Readings from Last 3 Encounters:  07/24/20 157 lb (71.2 kg)  07/05/20 157 lb 6.5 oz (71.4 kg)  05/11/20 153 lb (69.4 kg)    General: Alert, oriented, no distress.  Skin: normal turgor, no rashes, warm and dry HEENT: Normocephalic, atraumatic. Pupils equal round and reactive to light; sclera anicteric;  extraocular muscles intact;  Nose without nasal septal hypertrophy Mouth/Parynx benign; Mallinpatti scale 3 Neck: No JVD, no carotid bruits; normal carotid upstroke Lungs: clear to ausculatation and percussion; no wheezing or rales Chest wall: without tenderness to palpitation Heart: PMI not displaced, RRR, s1 s2 normal, 1/6 systolic murmur, no diastolic murmur, no rubs, gallops, thrills, or heaves Abdomen: soft, nontender; no hepatosplenomehaly, BS+; abdominal aorta nontender and not dilated by palpation. Back: no CVA tenderness Pulses 2+ Musculoskeletal: full range of motion, normal strength, no joint deformities Extremities: no clubbing cyanosis or edema, Homan's sign negative  Neurologic: grossly nonfocal; Cranial nerves grossly wnl Psychologic: Normal mood and affect   Studies/Labs Reviewed:   EKG:  EKG is  ordered today.  ECG (independently read by me): NSR at 72; Left axis deviation  January 2022 ECG (independently read by me): Normal sinus rhythm at 68 bpm.  Left axis deviation.  Nondiagnostic T wave abnormality.  Recent Labs: BMP Latest Ref Rng & Units 05/07/2020 03/15/2020 02/08/2020  Glucose 70 - 99 mg/dL 134(H) 120(H) 216(H)  BUN 8 - 23 mg/dL _1 Creatinine 0.44 - 1.00 mg/dL 0.61 0.57 0.68  BUN/Creat Ratio 12 - 28 - 25 19  Sodium 135 - 145 mmol/L 139 140 141  Potassium 3.5 - 5.1 mmol/L 3.9 4.2 3.8  Chloride 98 - 111 mmol/L 105 103 105  CO2 22 - 32 mmol/L _2 Calcium 8.9 - 10.3 mg/dL 9.7 9.7 9.5     Hepatic Function Latest Ref Rng & Units 05/07/2020 03/15/2020  Total Protein 6.5 - 8.1 g/dL 7.3 7.0  Albumin 3.5 - 5.0 g/dL 4.2 4.5  AST 15 - 41 U/L 26 21  ALT 0 - 44 U/L 27 28  Alk Phosphatase 38 - 126 U/L 67 97  Total Bilirubin 0.3 - 1.2 mg/dL 0.8 0.7    CBC Latest Ref Rng & Units 05/07/2020 02/08/2020 01/18/2020  WBC 4.0 - 10.5 K/uL 9.4 9.3 14.5(H)  Hemoglobin 12.0 - 15.0 g/dL 15.0 14.0 13.0  Hematocrit 36.0 - 46.0 % 44.1 40.7 39.1  Platelets 150 - 400  K/uL 222 204 192   Lab Results  Component Value Date   MCV 94.0 05/07/2020   MCV 91 02/08/2020   MCV 94.4 01/18/2020   Lab Results  Component Value Date   TSH 0.535 03/15/2020   Lab Results  Component Value Date   HGBA1C 7.3 (H) 01/17/2020     BNP No results found for: BNP  ProBNP No results found for: PROBNP   Lipid Panel  Component Value Date/Time   CHOL 117 03/15/2020 0812   TRIG 130 03/15/2020 0812   HDL 42 03/15/2020 0812   CHOLHDL 2.8 03/15/2020 0812   CHOLHDL 4.2 01/17/2020 1206   VLDL 37 01/17/2020 1206   LDLCALC 52 03/15/2020 0812   LABVLDL 23 03/15/2020 0812     RADIOLOGY: No results found.   Additional studies/ records that were reviewed today include:   01/17/2020 Conclusions: 1. Significant two-vessel coronary artery disease with occlusions of the proximal LCx (acute with heavy thrombus burden) and mid RCA (chronic with left-to-right collaterals). Moderate, non-obstructive coronary artery disease is also present in the ramus intermedius and distal LCx (codominant vessel). 2. Mid LAD myocardial bridge. 3. Mildly elevated left ventricular filling pressure with lateral wall hypokinesis and mildly reduced contraction. 4. Successful PCI to proximal LCx with aspiration thrombectomy and placement of Resolute Onyx 3.0 x 18 mm drug-eluting stent (postdilated to 3.3 mm) with 0% residual stenosis and TIMI-3 flow.  Recommendations: 1. Dual antiplatelet therapy with aspirin and ticagrelor for at least 12 months. 2. Aggressive secondary prevention, including high-intensity statin therapy and smoking cessation. 3. Medical management of chronic total occlusion of mid RCA as well as moderate ramus/distal LCx disease. 4. Remove right femoral artery sheath with manual compression two hours after discontinuation of bivalirudin  Intervention    ECHO IMPRESSIONS  1. Left ventricular ejection fraction, by estimation, is 55%. The left  ventricle has normal  function. The left ventricle has no regional wall  motion abnormalities. Left ventricular diastolic parameters were normal.  2. Right ventricular systolic function is normal. The right ventricular  size is normal.  3. The mitral valve is normal in structure. No evidence of mitral valve  regurgitation. No evidence of mitral stenosis.  4. The aortic valve is normal in structure. Aortic valve regurgitation is  not visualized. Mild aortic valve sclerosis is present, with no evidence  of aortic valve stenosis.  5. Aortic dilatation noted. There is mild dilatation of the ascending  aorta, measuring 40 mm.  6. The inferior vena cava is normal in size with greater than 50%  respiratory variability, suggesting right atrial pressure of 3 mmHg.    ASSESSMENT:    1. Non-ST elevation (NSTEMI) myocardial infarction ([place])   2. Essential hypertension   3. Type 2 diabetes mellitus with complication, without long-term current use of insulin ([place])   4. Hyperlipidemia with target LDL less than 70   5. Excessive daytime sleepiness   6. Poor sleep   7. Atypical chest pain   8. History of tobacco use: quit November 2021     PLAN:  Christina Larsen is a very pleasant 65 year old female who is originally from [place].  She has a history of previous significant tobacco use, hypertension, diabetes mellitus, as well as hyperlipidemia.  She had recently developed an acute coronary syndrome 1 week following her redo right shoulder surgery and was found to have thrombotic occlusion of her proximal circumflex in November 2021.  Catheterization also revealed a chronic total occlusion of RCA which was well collateralized via septal perforators arising from the LAD.  She also had mild concomitant CAD in her LAD, moderate in the ramus intermediate, and in her proximal RCA.  She has not had any recurrent anginal symptomatology and underwent successful DES stenting after thrombectomy of her proximal circumflex.  Since  her initial post hospital office visit with me, she apparently presented to the emergency room in February 2022 with atypical chest pain that was described  as a stabbing sharp discomfort radiating to her right shoulder.  This most likely was musculoskeletal in etiology.  She has not had recurrent symptoms.  Her blood pressure today initially was elevated but on repeat by me was stable on her regimen consisting metoprolol tartrate 25 mg twice a day.  She continues to be on DAPT with aspirin 81 mg and Brilinta 90 mg twice a day.  She is on atorvastatin 80 mg for hyperlipidemia with target LDL less than 70 and most recent LDL cholesterol at 52.  She is diabetic on metformin in addition to Trulicity.  She completed cardiac rehab.  Recent hemoglobin A1c was 6.6.  Presently she also admits to significant fatigability in the office today an Epworth Sleepiness Scale score was calculated and endorsed at 16 consistent with significant excessive daytime sleepiness.  With her poor sleep and cardiovascular comorbidities I am recommending a sleep study for evaluation of obstructive sleep apnea.    Medication Adjustments/Labs and Tests Ordered: Current medicines are reviewed at length with the patient today.  Concerns regarding medicines are outlined above.  Medication changes, Labs and Tests ordered today are listed in the Patient Instructions below. Patient Instructions  Medication Instructions:  Your physician recommends that you continue on your current medications as directed. Please refer to the Current Medication list given to you today.  *If you need a refill on your cardiac medications before your next appointment, please call your pharmacy*   Lab Work: None ordered  If you have labs (blood work) drawn today and your tests are completely normal, you will receive your results only by: Marland Kitchen MyChart Message (if you have MyChart) OR . A paper copy in the mail If you have any lab test that is abnormal or we  need to change your treatment, we will call you to review the results.   Testing/Procedures: Your physician has recommended that you have a sleep study. This test records several body functions during sleep, including: brain activity, eye movement, oxygen and carbon dioxide blood levels, heart rate and rhythm, breathing rate and rhythm, the flow of air through your mouth and nose, snoring, body muscle movements, and chest and belly movement.     Follow-Up: At [facility], you and your health needs are our priority.  As part of our continuing mission to provide you with exceptional heart care, we have created designated Provider Care Teams.  These Care Teams include your primary Cardiologist (physician) and Advanced Practice Providers (APPs -  Physician Assistants and Nurse Practitioners) who all work together to provide you with the care you need, when you need it.  We recommend signing up for the patient portal called "MyChart".  Sign up information is provided on this After Visit Summary.  MyChart is used to connect with patients for Virtual Visits (Telemedicine).  Patients are able to view lab/test results, encounter notes, upcoming appointments, etc.  Non-urgent messages can be sent to your provider as well.   To learn more about what you can do with MyChart, go to [email or web address].    Your next appointment:   4 month(s)  The format for your next appointment:   In Person  Provider:   Shelva Majestic, MD        Signed, Shelva Majestic, MD  07/30/2020 5:46 PM    [facility] [address] [place], [place], [place]  [address] Phone: [phone]

## 2020-07-28 NOTE — Telephone Encounter (Signed)
Provider did not complete forms.

## 2020-07-30 ENCOUNTER — Encounter: Payer: Self-pay | Admitting: Cardiovascular Disease

## 2020-07-31 ENCOUNTER — Telehealth: Payer: Self-pay | Admitting: *Deleted

## 2020-07-31 NOTE — Telephone Encounter (Signed)
Prior Authorization for split night sleep study sent to [facility] via fax  @ [phone].

## 2020-07-31 NOTE — Telephone Encounter (Signed)
-----   Message from Gerome Sam sent at 07/24/2020  9:27 AM EDT ----- Regarding: PRECERT Per Dr.Kelly, Sleep study.  Thanks

## 2020-08-02 ENCOUNTER — Other Ambulatory Visit (HOSPITAL_BASED_OUTPATIENT_CLINIC_OR_DEPARTMENT_OTHER): Payer: Self-pay

## 2020-08-02 MED FILL — Metformin HCl Tab 1000 MG: ORAL | 90 days supply | Qty: 180 | Fill #0 | Status: AC

## 2020-08-02 MED FILL — Atorvastatin Calcium Tab 80 MG (Base Equivalent): ORAL | 90 days supply | Qty: 90 | Fill #0 | Status: AC

## 2020-08-14 NOTE — Telephone Encounter (Signed)
Patient notified of split night sleep study scheduled for August 8th. HTA auth received. Approval # [id number]. Valid dates 09/08/31 to 12/06/20.

## 2020-08-15 DIAGNOSIS — G5601 Carpal tunnel syndrome, right upper limb: Secondary | ICD-10-CM | POA: Diagnosis not present

## 2020-08-15 DIAGNOSIS — M25512 Pain in left shoulder: Secondary | ICD-10-CM | POA: Diagnosis not present

## 2020-08-15 DIAGNOSIS — M75111 Incomplete rotator cuff tear or rupture of right shoulder, not specified as traumatic: Secondary | ICD-10-CM | POA: Diagnosis not present

## 2020-09-04 ENCOUNTER — Other Ambulatory Visit (HOSPITAL_BASED_OUTPATIENT_CLINIC_OR_DEPARTMENT_OTHER): Payer: Self-pay

## 2020-09-04 MED FILL — Aspirin Tab Delayed Release 81 MG: ORAL | 120 days supply | Qty: 120 | Fill #0 | Status: AC

## 2020-09-14 ENCOUNTER — Other Ambulatory Visit: Payer: Self-pay | Admitting: *Deleted

## 2020-09-14 MED ORDER — TICAGRELOR 90 MG PO TABS: ORAL_TABLET | Freq: Two times a day (BID) | ORAL | 3 refills | Status: DC

## 2020-09-25 ENCOUNTER — Telehealth: Payer: Self-pay | Admitting: *Deleted

## 2020-09-25 NOTE — Telephone Encounter (Signed)
I was handed a message from our HIM Dept that [facility] called in regards to appt today for this pt. I placed a call to [facility] and left a message that we will require a clearance request to be faxed to our office fax# [phone] ATTN: PRE OP TEAM. Left message request will need to have procedure being done, if any teeth to be removed we will need to know how many, type of anesthesia to be used if any, fax # where we can fax a clearance.

## 2020-09-26 ENCOUNTER — Telehealth: Payer: Self-pay

## 2020-09-26 NOTE — Telephone Encounter (Signed)
   Primary Cardiologist: [name], MD  Chart reviewed as part of pre-operative protocol coverage. Simple dental extractions are considered low risk procedures per guidelines and generally do not require any specific cardiac clearance. It is also generally accepted that for simple extractions and dental cleanings, there is no need to interrupt blood thinner therapy.   SBE prophylaxis is not required for the patient.  I will route this recommendation to the requesting party via Epic fax function and remove from pre-op pool.  Please call with questions.  Ronney Asters, NP 09/26/2020, 3:11 PM

## 2020-09-26 NOTE — Telephone Encounter (Signed)
   [facility] Pre-operative Risk Assessment    Patient Name: Chaylee Ehrsam  DOB: 1955/07/19 MRN: [id number]  HEARTCARE STAFF:  - IMPORTANT!!!!!! Under Visit Info/Reason for Call, type in Other and utilize the format Clearance MM/DD/YY or Clearance TBD. Do not use dashes or single digits. - Please review there is not already an duplicate clearance open for this procedure. - If request is for dental extraction, please clarify the # of teeth to be extracted. - If the patient is currently at the dentist's office, call Pre-Op Callback Staff (MA/nurse) to input urgent request.  - If the patient is not currently in the dentist office, please route to the Pre-Op pool.  Request for surgical clearance:  What type of surgery is being performed? Periodontal Scaling Root Planing  When is this surgery scheduled? 2 sessions- 10/31/20 and 11/07/20  What type of clearance is required (medical clearance vs. Pharmacy clearance to hold med vs. Both)? Medical  Are there any medications that need to be held prior to surgery and how long? None listed  Practice name and name of physician performing surgery? [facility]. Dr. Valere Dross  What is the office phone number? [phone]   7.   What is the office fax number? [phone]  8.   Anesthesia type (None, local, MAC, general)? Local

## 2020-09-26 NOTE — Telephone Encounter (Signed)
Left message with [facility] requesting a clearance letter be sent over to our office. I left our fax, call back number and all the information we would need to be able to provide the patient with cardiac clearance.   Awaiting a fax or call back.

## 2020-09-28 NOTE — Telephone Encounter (Signed)
Clearance has been received under new phone message. Will close this encounter and remove it from preop call back pool.

## 2020-10-09 ENCOUNTER — Other Ambulatory Visit (HOSPITAL_BASED_OUTPATIENT_CLINIC_OR_DEPARTMENT_OTHER): Payer: Self-pay

## 2020-10-09 MED FILL — Metoprolol Tartrate Tab 25 MG: ORAL | 90 days supply | Qty: 180 | Fill #1 | Status: AC

## 2020-10-09 MED FILL — Atorvastatin Calcium Tab 80 MG (Base Equivalent): ORAL | 90 days supply | Qty: 90 | Fill #1 | Status: AC

## 2020-10-16 ENCOUNTER — Ambulatory Visit (HOSPITAL_BASED_OUTPATIENT_CLINIC_OR_DEPARTMENT_OTHER): Payer: PPO | Attending: Cardiovascular Disease | Admitting: Cardiovascular Disease

## 2020-10-16 ENCOUNTER — Other Ambulatory Visit: Payer: Self-pay

## 2020-10-16 DIAGNOSIS — I214 Non-ST elevation (NSTEMI) myocardial infarction: Secondary | ICD-10-CM | POA: Insufficient documentation

## 2020-10-16 DIAGNOSIS — R0683 Snoring: Secondary | ICD-10-CM | POA: Insufficient documentation

## 2020-10-16 DIAGNOSIS — Z7982 Long term (current) use of aspirin: Secondary | ICD-10-CM | POA: Diagnosis not present

## 2020-10-16 DIAGNOSIS — G4761 Periodic limb movement disorder: Secondary | ICD-10-CM | POA: Insufficient documentation

## 2020-10-16 DIAGNOSIS — G478 Other sleep disorders: Secondary | ICD-10-CM | POA: Diagnosis not present

## 2020-10-16 DIAGNOSIS — R0902 Hypoxemia: Secondary | ICD-10-CM | POA: Insufficient documentation

## 2020-10-16 DIAGNOSIS — I493 Ventricular premature depolarization: Secondary | ICD-10-CM | POA: Insufficient documentation

## 2020-10-16 DIAGNOSIS — Z7901 Long term (current) use of anticoagulants: Secondary | ICD-10-CM | POA: Diagnosis not present

## 2020-10-26 ENCOUNTER — Encounter (HOSPITAL_BASED_OUTPATIENT_CLINIC_OR_DEPARTMENT_OTHER): Payer: Self-pay | Admitting: Cardiovascular Disease

## 2020-10-26 ENCOUNTER — Telehealth: Payer: Self-pay | Admitting: *Deleted

## 2020-10-26 NOTE — Telephone Encounter (Signed)
Patient notified of her sleep study results.

## 2020-10-26 NOTE — Telephone Encounter (Signed)
-----   Message from [name], MD sent at 10/26/2020  1:24 PM EDT ----- Christina Larsen, please notify pt results of sleep study

## 2020-10-26 NOTE — Procedures (Signed)
Patient Name: Christina Larsen, Christina Larsen Date: 10/16/2020 Gender: Female D.O.B: 11/22/1955 Age (years): 28 Referring Provider: [name] MD, ABSM Height (inches): 65 Interpreting Physician: [name] MD, ABSM Weight (lbs): 160 RPSGT: Lowry Ram BMI: 27 MRN: [id number] Neck Size: 14.00  CLINICAL INFORMATION Sleep Study Type: NPSG  Indication for sleep study: Daytime Fatigue, Diabetes, Fatigue, Hypertension, Obesity, Snoring  Epworth Sleepiness Score: 9  SLEEP STUDY TECHNIQUE As per the AASM Manual for the Scoring of Sleep and Associated Events v2.3 (April 2016) with a hypopnea requiring 4% desaturations.  The channels recorded and monitored were frontal, central and occipital EEG, electrooculogram (EOG), submentalis EMG (chin), nasal and oral airflow, thoracic and abdominal wall motion, anterior tibialis EMG, snore microphone, electrocardiogram, and pulse oximetry.  MEDICATIONS aspirin 81 MG EC tablet atorvastatin (LIPITOR) 80 MG tablet Dulaglutide 1.5 MG/0.5ML SOPN gabapentin (NEURONTIN) 300 MG capsule metFORMIN (GLUCOPHAGE) 1000 MG tablet metoprolol tartrate (LOPRESSOR) 25 MG tablet Multiple Vitamin (MULTIVITAMIN) tablet nitroGLYCERIN (NITROSTAT) 0.4 MG SL tablet ticagrelor (BRILINTA) 90 MG TABS tablet Medications self-administered by patient taken the night of the study : Brilinta, METFORMIN, METOPROLOL  SLEEP ARCHITECTURE The study was initiated at 10:17:16 PM and ended at 5:20:30 AM.  Sleep onset time was 27.5 minutes and the sleep efficiency was 71.3%%. The total sleep time was 301.7 minutes.  Stage REM latency was 71.5 minutes.  The patient spent 8.1%% of the night in stage N1 sleep, 66.0%% in stage N2 sleep, 0.0%% in stage N3 and 25.9% in REM.  Alpha intrusion was absent.  Supine sleep was 11.44%.  RESPIRATORY PARAMETERS The overall apnea/hypopnea index (AHI) was 0.0 per hour. The respiratory disturbance index (RDI) was 12.5/h. There were 0  total apneas, including 0 obstructive, 0 central and 0 mixed apneas. There were 0 hypopneas and 63 RERAs.  The AHI during Stage REM sleep was 0.0 per hour.  AHI while supine was 0.0 per hour.  The mean oxygen saturation was 91.9%. The minimum SpO2 during sleep was 89.0%.  Moderate snoring was noted during this study.  CARDIAC DATA The 2 lead EKG demonstrated sinus rhythm. The mean heart rate was 75.7 beats per minute. Other EKG findings include: PVCs.  LEG MOVEMENT DATA The total PLMS were 0 with a resulting PLMS index of 0.0. Associated arousal with leg movement index was 10.5 .  IMPRESSIONS - No significant obstructive sleep apnea occurred during this study (AHI 0.0/h; although RDI was inceased at 12.5/h). - The patient had minimal oxygen desaturation to a nadir of 89.0%. - The patient snored with moderate snoring volume. - EKG findings include PVCs. - Clinically significant periodic limb movements did not occur during sleep. Associated arousals were significant.  DIAGNOSIS - Increased upper airways resistance (UARS) - Periodic Limb Movement During Sleep (G47.61) - Nocturnal Hypoxemia (G47.36)  RECOMMENDATIONS - At present, there is no indication for CPAP. - Effort should be made to optimize nasal and oropharyngeal patency. - Consider alternatives for the treatment of moderate snoring.  - If patient is sympromatic with restless legs consider a trial of pharmacotherapy. - Avoid alcohol, sedatives and other CNS depressants that may worsen sleep apnea and disrupt normal sleep architecture. - Sleep hygiene should be reviewed to assess factors that may improve sleep quality. - Weight management and regular exercise should be initiated or continued if appropriate.  [Electronically signed] 10/26/2020 01:18 PM  [name] MD, [facility], ABSM Diplomate, American Board of Sleep Medicine   NPI: [id number]  [facility] PH: [phone]  FX: [phone]  [phone] [place]

## 2020-11-01 DIAGNOSIS — E1169 Type 2 diabetes mellitus with other specified complication: Secondary | ICD-10-CM | POA: Diagnosis not present

## 2020-11-01 DIAGNOSIS — E7800 Pure hypercholesterolemia, unspecified: Secondary | ICD-10-CM | POA: Diagnosis not present

## 2020-11-01 DIAGNOSIS — I10 Essential (primary) hypertension: Secondary | ICD-10-CM | POA: Diagnosis not present

## 2020-11-01 LAB — CBC AND DIFFERENTIAL
HCT: 46 (ref 36–46)
Hemoglobin: 15.8 (ref 12.0–16.0)
Platelets: 225 10*3/uL (ref 150–400)
WBC: 8.6

## 2020-11-01 LAB — HEPATIC FUNCTION PANEL
ALT: 23 U/L (ref 7–35)
AST: 20 (ref 13–35)
Alkaline Phosphatase: 70 (ref 25–125)
Bilirubin, Total: 1.5

## 2020-11-01 LAB — CBC: RBC: 5 (ref 3.87–5.11)

## 2020-11-01 LAB — COMPREHENSIVE METABOLIC PANEL
Albumin: 4.3 (ref 3.5–5.0)
Calcium: 10.2 (ref 8.7–10.7)

## 2020-11-01 LAB — BASIC METABOLIC PANEL
BUN: 11 (ref 4–21)
CO2: 22 (ref 13–22)
Chloride: 107 (ref 99–108)
Creatinine: 0.8 (ref <=1.1)
Glucose: 134
Potassium: 4.9 mEq/L (ref 3.5–5.1)
Sodium: 141 (ref 137–147)

## 2020-11-01 LAB — LIPID PANEL
Cholesterol: 119 (ref 0–200)
HDL: 47 (ref 35–70)
LDL Cholesterol: 35
LDl/HDL Ratio: 0.7
Triglycerides: 183 — AB (ref 40–160)

## 2020-11-01 LAB — HEMOGLOBIN A1C: Hemoglobin A1C: 6.7

## 2020-11-01 LAB — TSH: TSH: 0.04 — AB (ref <=5.90)

## 2020-11-02 ENCOUNTER — Telehealth: Payer: Self-pay | Admitting: Cardiovascular Disease

## 2020-11-02 ENCOUNTER — Other Ambulatory Visit: Payer: Self-pay

## 2020-11-02 ENCOUNTER — Ambulatory Visit: Payer: PPO | Admitting: Cardiovascular Disease

## 2020-11-02 VITALS — BP 136/80 | HR 72 | Ht 65.0 in | Wt 156.0 lb

## 2020-11-02 DIAGNOSIS — E118 Type 2 diabetes mellitus with unspecified complications: Secondary | ICD-10-CM

## 2020-11-02 DIAGNOSIS — G478 Other sleep disorders: Secondary | ICD-10-CM

## 2020-11-02 DIAGNOSIS — I214 Non-ST elevation (NSTEMI) myocardial infarction: Secondary | ICD-10-CM

## 2020-11-02 DIAGNOSIS — I10 Essential (primary) hypertension: Secondary | ICD-10-CM | POA: Diagnosis not present

## 2020-11-02 DIAGNOSIS — R0683 Snoring: Secondary | ICD-10-CM | POA: Diagnosis not present

## 2020-11-02 DIAGNOSIS — G4719 Other hypersomnia: Secondary | ICD-10-CM | POA: Diagnosis not present

## 2020-11-02 NOTE — Patient Instructions (Signed)
Medication Instructions:  Your physician recommends that you continue on your current medications as directed. Please refer to the Current Medication list given to you today.  *If you need a refill on your cardiac medications before your next appointment, please call your pharmacy*    Follow-Up: At [facility], you and your health needs are our priority.  As part of our continuing mission to provide you with exceptional heart care, we have created designated Provider Care Teams.  These Care Teams include your primary Cardiologist (physician) and Advanced Practice Providers (APPs -  Physician Assistants and Nurse Practitioners) who all work together to provide you with the care you need, when you need it.  We recommend signing up for the patient portal called "MyChart".  Sign up information is provided on this After Visit Summary.  MyChart is used to connect with patients for Virtual Visits (Telemedicine).  Patients are able to view lab/test results, encounter notes, upcoming appointments, etc.  Non-urgent messages can be sent to your provider as well.   To learn more about what you can do with MyChart, go to [email or web address].    Your next appointment:   6 month(s)  The format for your next appointment:   In Person  Provider:   You may see Thomas Kelly, MD or one of the following Advanced Practice Providers on your designated Care Team:    Hao Meng, PA-C  Angela Duke, PA-C or   Krista Kroeger, PA-C    Other Instructions   

## 2020-11-02 NOTE — Progress Notes (Signed)
Cardiology Office Note    Date:  11/04/2020   ID:  Christina Larsen, DOB [birth year], MRN [id number]  PCP:  Burnard Bunting, MD  Cardiologist:  Shelva Majestic, MD   3 month  F/U office visit  History of Present Illness:  Christina Larsen is a 65 y.o. female who is originally from [place].  She has a history of diabetes mellitus, hypertension, hyperlipidemia, and tobacco use.  She had undergone initial rotator cuff surgery in June 2021 and underwent a redo surgery at the end of October.  While watching [facility], she had developed significant chest pain and presented to [facility] where ECG showed anteroseptal ST depression.  Troponin was elevated to 85 > 860.  She was given aspirin and started on heparin and nitroglycerin.  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  She required aspiration thrombectomy for significant clot burden and underwent successful stenting of her proximal circumflex vessel.  There was distal circumflex disease treated medically.  I had seen her during her hospitalization.  We had a lengthy discussion regarding absolute smoking cessation and since her presentation she has not had any further cigarettes.  She has been undergoing physical therapy for her redo shoulder surgery.  She was seen for initial post hospital follow-up on November 30 by Kathrynn Humble, NP.  I saw her for my initial office evaluation on March 14, 2020.  At that time she was feeling well and denied recurrent chest pain symptomatology and was unaware of palpitations.  She continued to be on aspirin and Brilinta for DAPT for minimum of 1 year, and is on metoprolol tartrate 25 mg twice a day.  She was tolerating  atorvastatin 80 mg daily and was on metformin and Trulicity for her diabetes mellitus.  She has peripheral neuropathy and is on gabapentin.    She presented to the emergency room on May 07, 2020 with complaints of intermittent chest tightness.  Her chest pain was intermittent and not exertionally precipitated.  Troponins were negative.  Chest x-ray did not reveal acute abnormalities.  The chest pain was a stabbing pain to the right shoulder and was not felt to be ischemic in etiology.  She was subsequently evaluated by [name] on May 09, 2020 and remained stable.  I last saw her on Jul 24, 2020.  On May 12 while walking her dog she had an episode of vertigo.  She was unaware of palpitations.  She denied any recurrent chest pain symptoms or exertional dyspnea.  She admits to poor sleep.  She also has experienced fatigue and daytime sleepiness.  An Epworth Sleepiness Scale score was therefore calculated in the office today and this endorsed at 16 consistent with excessive daytime sleepiness. She completed cardiac rehab.  She is now participating in [place] sneaker program at the gym.  During her evaluation, I felt she was stable with reference to CAD.  I raise concern for possible sleep apnea which may be contributing to her significant excessive daytime sleepiness and with her poor sleep and cardiovascular comorbidities a sleep study was recommended.  She underwent a diagnostic polysomnogram in October 16, 2020.  Her AHI was 0 but she did have increased respiratory disturbance index of 12.5.  There was no  significant oxygen desaturation.  She had moderate snoring throughout the study.  He was felt most likely that she had increased upper airway resistance syndrome.  She was also noted to have periodic limb movement during sleep with associated arousals with an index of 10.5/h.  Presently, she continues to go to [employer]  She denies any recurrent chest pain.  She continues to be on  DAPT with aspirin/Plavix.  She is on atorvastatin 80 mg for aggressive lipid-lowering therapy.  She is diabetic on metformin and Trulicity.  She has neuropathy on gabapentin.  Remotely, she did try medication in 2006 for restless legs.  She presents for evaluation.   Past Medical History:  Diagnosis Date   Coronary artery disease    11/9 NSTEMI, PCI to pLCx with aspiration thrombectomy/DESx1, occluded mRCA with left to right collaterals   Diabetes mellitus without complication ([place])    HLD (hyperlipidemia)     Past Surgical History:  Procedure Laterality Date   [place]   CORONARY STENT INTERVENTION N/A 01/17/2020   Procedure: CORONARY STENT INTERVENTION;  Surgeon: Nelva Bush, MD;  Location: [facility];  Service: Cardiovascular;  Laterality: N/A;   LAMINECTOMY  2006   LEFT HEART CATH AND CORONARY ANGIOGRAPHY N/A 01/17/2020   Procedure: LEFT HEART CATH AND CORONARY ANGIOGRAPHY;  Surgeon: Nelva Bush, MD;  Location: [facility];  Service: Cardiovascular;  Laterality: N/A;   rotator cuff surgery      Current Medications: Outpatient Medications Prior to Visit  Medication Sig Dispense Refill   aspirin 81 MG EC tablet TAKE 1 TABLET (81 MG TOTAL) BY MOUTH DAILY. SWALLOW WHOLE. 30 tablet 10   atorvastatin (LIPITOR) 80 MG tablet TAKE 1 TABLET BY MOUTH ONCE DAILY 90 tablet 3   Dulaglutide 1.5 MG/0.5ML SOPN INJECT 1.5MG ONCE WEEKLY 6 mL 3   gabapentin (NEURONTIN) 300 MG capsule Take 300 mg by mouth as needed (for nerve pain).     metFORMIN (GLUCOPHAGE) 1000 MG tablet TAKE 1 TABLET BY MOUTH 2 TIMES DAILY WITH MEALS 180 tablet 3   metoprolol tartrate (LOPRESSOR) 25 MG tablet TAKE 1 TABLET BY MOUTH TWICE DAILY 180 tablet 3   Multiple Vitamin (MULTIVITAMIN) tablet Take 1 tablet by mouth daily.     nitroGLYCERIN (NITROSTAT) 0.4 MG SL tablet PLACE 1 TABLET UNDER THE TONGUE EVERY 5 MINUTES AS NEEDED 25 tablet 2   ticagrelor (BRILINTA)  90 MG TABS tablet TAKE 1 TABLET BY MOUTH TWICE DAILY 180 tablet 3   No facility-administered medications prior to visit.     Allergies:   Hydrocodone-acetaminophen   Social History   Socioeconomic History   Marital status: Widowed    Spouse name: Not on file   Number of children: 1   Years of education: 15   Highest education level: Not on file  Occupational History   Occupation: [employer]  Tobacco Use   Smoking status: Former    Types: Cigarettes    Quit date: 01/16/2020    Years since quitting: 0.8   Smokeless tobacco: Never  Vaping Use   Vaping Use: Never used  Substance and Sexual Activity   Alcohol use: Yes    Alcohol/week: 4.0 standard drinks    Types: 4 Glasses of wine per week   Drug use: Never   Sexual activity: Not Currently  Other Topics Concern   Not on file  Social History Narrative   Semi Retired   Investment banker, operational of Health  Financial Resource Strain: Not on file  Food Insecurity: Not on file  Transportation Needs: Not on file  Physical Activity: Not on file  Stress: Not on file  Social Connections: Not on file    Socially she lives by herself.  She quit smoking the day of her heart attack.  She had been divorced from her husband who subsequently has died.  She has children.  Family History:  The patient's  family history includes Colon cancer in her mother; Diabetes in her mother; Hypercholesterolemia in her father; Hypertension in her brother; Prostate cancer in her father.   ROS General: Negative; No fevers, chills, or night sweats;  HEENT: Negative; No changes in vision or hearing, sinus congestion, difficulty swallowing Pulmonary: Negative; No cough, wheezing, shortness of breath, hemoptysis Cardiovascular: See HPI GI: Negative; No nausea, vomiting, diarrhea, or abdominal pain GU: Negative; No dysuria, hematuria, or difficulty voiding Musculoskeletal: Negative; no myalgias, joint pain, or weakness Hematologic/Oncology: Negative;  no easy bruising, bleeding Endocrine: Negative; no heat/cold intolerance; no diabetes Neuro: Negative; no changes in balance, headaches Skin: Negative; No rashes or skin lesions Psychiatric: Negative; No behavioral problems, depression Sleep: Poor sleep, fatigue, daytime sleepiness, snoring; no bruxism, restless legs, hypnogognic hallucinations, no cataplexy Other comprehensive 14 point system review is negative.   PHYSICAL EXAM:   VS:  BP 136/80 (BP Location: Right Arm, Patient Position: Sitting, Cuff Size: Normal)   Pulse 72   Ht 5' 5"  (1.651 m)   Wt 156 lb (70.8 kg)   BMI 25.96 kg/m     Repeat blood pressure by me was 128/82  Wt Readings from Last 3 Encounters:  11/02/20 156 lb (70.8 kg)  10/16/20 160 lb (72.6 kg)  07/24/20 157 lb (71.2 kg)    General: Alert, oriented, no distress.  Skin: normal turgor, no rashes, warm and dry HEENT: Normocephalic, atraumatic. Pupils equal round and reactive to light; sclera anicteric; extraocular muscles intact;  Nose without nasal septal hypertrophy Mouth/Parynx benign; Mallinpatti scale 3 Neck: No JVD, no carotid bruits; normal carotid upstroke Lungs: clear to ausculatation and percussion; no wheezing or rales Chest wall: without tenderness to palpitation Heart: PMI not displaced, RRR, s1 s2 normal, 1/6 systolic murmur, no diastolic murmur, no rubs, gallops, thrills, or heaves Abdomen: soft, nontender; no hepatosplenomehaly, BS+; abdominal aorta nontender and not dilated by palpation. Back: no CVA tenderness Pulses 2+ Musculoskeletal: full range of motion, normal strength, no joint deformities Extremities: no clubbing cyanosis or edema, Homan's sign negative  Neurologic: grossly nonfocal; Cranial nerves grossly wnl Psychologic: Normal mood and affect   Studies/Labs Reviewed:   EKG:  EKG is  ordered today.  ECG (independently read by me): Normal sinus rhythm at 72 bpm, left axis deviation.  No ectopy.  No ST segment changes.  Jul 24, 2020 ECG (independently read by me): NSR at 72; Left axis deviation  January 2022 ECG (independently read by me): Normal sinus rhythm at 68 bpm.  Left axis deviation.  Nondiagnostic T wave abnormality.  Recent Labs: BMP Latest Ref Rng & Units 05/07/2020 03/15/2020 02/08/2020  Glucose 70 - 99 mg/dL 134(H) 120(H) 216(H)  BUN 8 - 23 mg/dL 14 14 13   Creatinine 0.44 - 1.00 mg/dL 0.61 0.57 0.68  BUN/Creat Ratio 12 - 28 - 25 19  Sodium 135 - 145 mmol/L 139 140 141  Potassium 3.5 - 5.1 mmol/L 3.9 4.2 3.8  Chloride 98 - 111 mmol/L 105 103 105  CO2 22 - 32 mmol/L 23 20 23   Calcium 8.9 -  10.3 mg/dL 9.7 9.7 9.5     Hepatic Function Latest Ref Rng & Units 05/07/2020 03/15/2020  Total Protein 6.5 - 8.1 g/dL 7.3 7.0  Albumin 3.5 - 5.0 g/dL 4.2 4.5  AST 15 - 41 U/L 26 21  ALT 0 - 44 U/L 27 28  Alk Phosphatase 38 - 126 U/L 67 97  Total Bilirubin 0.3 - 1.2 mg/dL 0.8 0.7    CBC Latest Ref Rng & Units 05/07/2020 02/08/2020 01/18/2020  WBC 4.0 - 10.5 K/uL 9.4 9.3 14.5(H)  Hemoglobin 12.0 - 15.0 g/dL 15.0 14.0 13.0  Hematocrit 36.0 - 46.0 % 44.1 40.7 39.1  Platelets 150 - 400 K/uL 222 204 192   Lab Results  Component Value Date   MCV 94.0 05/07/2020   MCV 91 02/08/2020   MCV 94.4 01/18/2020   Lab Results  Component Value Date   TSH 0.535 03/15/2020   Lab Results  Component Value Date   HGBA1C 7.3 (H) 01/17/2020     BNP No results found for: BNP  ProBNP No results found for: PROBNP   Lipid Panel     Component Value Date/Time   CHOL 117 03/15/2020 0812   TRIG 130 03/15/2020 0812   HDL 42 03/15/2020 0812   CHOLHDL 2.8 03/15/2020 0812   CHOLHDL 4.2 01/17/2020 1206   VLDL 37 01/17/2020 1206   LDLCALC 52 03/15/2020 0812   LABVLDL 23 03/15/2020 0812     RADIOLOGY: SLEEP STUDY DOCUMENTS  Result Date: 10/18/2020 Ordered by an unspecified provider.    Additional studies/ records that were reviewed today include:   01/17/2020 Conclusions: Significant two-vessel coronary  artery disease with occlusions of the proximal LCx (acute with heavy thrombus burden) and mid RCA (chronic with left-to-right collaterals).  Moderate, non-obstructive coronary artery disease is also present in the ramus intermedius and distal LCx (codominant vessel). Mid LAD myocardial bridge. Mildly elevated left ventricular filling pressure with lateral wall hypokinesis and mildly reduced contraction. Successful PCI to proximal LCx with aspiration thrombectomy and placement of Resolute Onyx 3.0 x 18 mm drug-eluting stent (postdilated to 3.3 mm) with 0% residual stenosis and TIMI-3 flow.   Recommendations: Dual antiplatelet therapy with aspirin and ticagrelor for at least 12 months. Aggressive secondary prevention, including high-intensity statin therapy and smoking cessation. Medical management of chronic total occlusion of mid RCA as well as moderate ramus/distal LCx disease. Remove right femoral artery sheath with manual compression two hours after discontinuation of bivalirudin  Intervention      ECHO IMPRESSIONS   1. Left ventricular ejection fraction, by estimation, is 55%. The left  ventricle has normal function. The left ventricle has no regional wall  motion abnormalities. Left ventricular diastolic parameters were normal.   2. Right ventricular systolic function is normal. The right ventricular  size is normal.   3. The mitral valve is normal in structure. No evidence of mitral valve  regurgitation. No evidence of mitral stenosis.   4. The aortic valve is normal in structure. Aortic valve regurgitation is  not visualized. Mild aortic valve sclerosis is present, with no evidence  of aortic valve stenosis.   5. Aortic dilatation noted. There is mild dilatation of the ascending  aorta, measuring 40 mm.   6. The inferior vena cava is normal in size with greater than 50%  respiratory variability, suggesting right atrial pressure of 3 mmHg.    ASSESSMENT:    1. Non-ST  elevation (NSTEMI) myocardial infarction [facility]): January 16, 2021, DES stent to LCX   2.  Essential hypertension   3. Type 2 diabetes mellitus with complication, without long-term current use of insulin ([place])   4. UARS (upper airway resistance syndrome)   5. Snoring   6. Excessive daytime sleepiness     PLAN:  Christina Larsen is a very pleasant 65 year old female who is originally from [place].  She has a history of previous significant tobacco use, hypertension, diabetes mellitus, as well as hyperlipidemia.  She developed an acute coronary syndrome 1 week following her redo right shoulder surgery and was found to have thrombotic occlusion of her proximal circumflex in November 2021.  Catheterization also revealed a chronic total occlusion of RCA which was well collateralized via septal perforators arising from the LAD.  She also had mild concomitant CAD in her LAD, moderate in the ramus intermediate, and in her proximal RCA.  She has not had any recurrent anginal symptomatology and underwent successful DES stenting after thrombectomy of her proximal circumflex.  Since her initial post hospital office visit with me, she apparently presented to the emergency room in February 2022 with atypical chest pain that was described as a stabbing sharp discomfort radiating to her right shoulder.  This most likely was musculoskeletal in etiology.  She has not had recurrent symptoms.  She continues to be on DAPT following her STEMI and is tolerating this well.  She is in the [employer] assistance program and I filled out paperwork today so that she can continue to obtain Brilinta in this program.  She is on high potency statin therapy with atorvastatin 80 mg most recent LDL cholesterol is excellent at 52 in January 2022.  She tells me she had lab work done yesterday by Dr. Reynaldo Minium and will be seeing him next week for his yearly physical exam.  She has continued to exercise with [employer] sneakers class and walks on a  treadmill at least 2 days/week for 45 minutes to 1 hour.  I reviewed her sleep study which revealed increased upper airway resistance without definitive sleep apnea.  Of note, she did have periodic limb movement leading to arousals and remotely she had been on a medication (question direct up) in 2006.  She is diabetic on metformin and Trulicity.  Her blood pressure today is stable on metoprolol 25 mg twice a day with pulse at 72.  I will see her in 6 months for follow-up evaluation or sooner as needed.   Medication Adjustments/Labs and Tests Ordered: Current medicines are reviewed at length with the patient today.  Concerns regarding medicines are outlined above.  Medication changes, Labs and Tests ordered today are listed in the Patient Instructions below. Patient Instructions  Medication Instructions:  Your physician recommends that you continue on your current medications as directed. Please refer to the Current Medication list given to you today.  *If you need a refill on your cardiac medications before your next appointment, please call your pharmacy*   Follow-Up: At [facility], you and your health needs are our priority.  As part of our continuing mission to provide you with exceptional heart care, we have created designated Provider Care Teams.  These Care Teams include your primary Cardiologist (physician) and Advanced Practice Providers (APPs -  Physician Assistants and Nurse Practitioners) who all work together to provide you with the care you need, when you need it.  We recommend signing up for the patient portal called "MyChart".  Sign up information is provided on this After Visit Summary.  MyChart is used to connect with patients for  Virtual Visits (Telemedicine).  Patients are able to view lab/test results, encounter notes, upcoming appointments, etc.  Non-urgent messages can be sent to your provider as well.   To learn more about what you can do with MyChart, go to  [email or web address].    Your next appointment:   6 month(s)  The format for your next appointment:   In Person  Provider:   You may see Shelva Majestic, MD or one of the following Advanced Practice Providers on your designated Care Team:   Almyra Deforest, PA-C Fabian Sharp, PA-C or  Roby Lofts, [place]   Other Instructions    Signed, Shelva Majestic, MD  11/04/2020 3:11 PM    [facility] [address] [place], [place], [place]  [address] Phone: [phone]

## 2020-11-02 NOTE — Telephone Encounter (Signed)
Patient assistance application (MD/prescription portion) faxed to [employer] for Brilinta @ [phone]

## 2020-11-04 ENCOUNTER — Encounter: Payer: Self-pay | Admitting: Cardiovascular Disease

## 2020-11-08 ENCOUNTER — Other Ambulatory Visit (HOSPITAL_BASED_OUTPATIENT_CLINIC_OR_DEPARTMENT_OTHER): Payer: Self-pay

## 2020-11-08 DIAGNOSIS — E1169 Type 2 diabetes mellitus with other specified complication: Secondary | ICD-10-CM | POA: Diagnosis not present

## 2020-11-08 DIAGNOSIS — Z720 Tobacco use: Secondary | ICD-10-CM | POA: Diagnosis not present

## 2020-11-08 DIAGNOSIS — E7800 Pure hypercholesterolemia, unspecified: Secondary | ICD-10-CM | POA: Diagnosis not present

## 2020-11-08 DIAGNOSIS — Z1331 Encounter for screening for depression: Secondary | ICD-10-CM | POA: Diagnosis not present

## 2020-11-08 DIAGNOSIS — I214 Non-ST elevation (NSTEMI) myocardial infarction: Secondary | ICD-10-CM | POA: Diagnosis not present

## 2020-11-08 DIAGNOSIS — I10 Essential (primary) hypertension: Secondary | ICD-10-CM | POA: Diagnosis not present

## 2020-11-08 DIAGNOSIS — Z1339 Encounter for screening examination for other mental health and behavioral disorders: Secondary | ICD-10-CM | POA: Diagnosis not present

## 2020-11-08 DIAGNOSIS — Z0000 Encounter for general adult medical examination without abnormal findings: Secondary | ICD-10-CM | POA: Diagnosis not present

## 2020-11-08 DIAGNOSIS — E039 Hypothyroidism, unspecified: Secondary | ICD-10-CM | POA: Diagnosis not present

## 2020-11-08 DIAGNOSIS — R82998 Other abnormal findings in urine: Secondary | ICD-10-CM | POA: Diagnosis not present

## 2020-11-08 DIAGNOSIS — Z1212 Encounter for screening for malignant neoplasm of rectum: Secondary | ICD-10-CM | POA: Diagnosis not present

## 2020-11-08 DIAGNOSIS — R7989 Other specified abnormal findings of blood chemistry: Secondary | ICD-10-CM | POA: Diagnosis not present

## 2020-11-08 LAB — PROTEIN / CREATININE RATIO, URINE: Creatinine, Urine: 64.2

## 2020-11-08 LAB — MICROALBUMIN, URINE: Microalb, Ur: 8

## 2020-11-08 LAB — MICROALBUMIN / CREATININE URINE RATIO: Microalb Creat Ratio: 12.5

## 2020-11-08 MED FILL — Metformin HCl Tab 1000 MG: ORAL | 90 days supply | Qty: 180 | Fill #1 | Status: AC

## 2020-11-21 DIAGNOSIS — M25511 Pain in right shoulder: Secondary | ICD-10-CM | POA: Diagnosis not present

## 2020-12-27 DIAGNOSIS — Z1231 Encounter for screening mammogram for malignant neoplasm of breast: Secondary | ICD-10-CM | POA: Diagnosis not present

## 2020-12-27 LAB — HM MAMMOGRAPHY

## 2021-01-03 ENCOUNTER — Other Ambulatory Visit (HOSPITAL_BASED_OUTPATIENT_CLINIC_OR_DEPARTMENT_OTHER): Payer: Self-pay

## 2021-01-03 ENCOUNTER — Other Ambulatory Visit: Payer: Self-pay | Admitting: Cardiology

## 2021-01-03 MED ORDER — ASPIRIN 81 MG PO TBEC
DELAYED_RELEASE_TABLET | Freq: Every day | ORAL | 10 refills | Status: AC
  Filled 2021-01-03: qty 30, 30d supply, fill #0

## 2021-01-03 MED FILL — Aspirin Tab Delayed Release 81 MG: ORAL | 60 days supply | Qty: 60 | Fill #1 | Status: CN

## 2021-01-04 ENCOUNTER — Other Ambulatory Visit (HOSPITAL_BASED_OUTPATIENT_CLINIC_OR_DEPARTMENT_OTHER): Payer: Self-pay

## 2021-01-15 ENCOUNTER — Other Ambulatory Visit (HOSPITAL_BASED_OUTPATIENT_CLINIC_OR_DEPARTMENT_OTHER): Payer: Self-pay

## 2021-01-15 MED FILL — Metoprolol Tartrate Tab 25 MG: ORAL | 90 days supply | Qty: 180 | Fill #2 | Status: AC

## 2021-02-03 MED FILL — Atorvastatin Calcium Tab 80 MG (Base Equivalent): ORAL | 90 days supply | Qty: 90 | Fill #2 | Status: AC

## 2021-02-03 MED FILL — Metformin HCl Tab 1000 MG: ORAL | 90 days supply | Qty: 180 | Fill #2 | Status: AC

## 2021-02-05 ENCOUNTER — Other Ambulatory Visit (HOSPITAL_BASED_OUTPATIENT_CLINIC_OR_DEPARTMENT_OTHER): Payer: Self-pay

## 2021-02-07 ENCOUNTER — Telehealth: Payer: Self-pay | Admitting: Cardiovascular Disease

## 2021-02-07 NOTE — Telephone Encounter (Signed)
02/06/21  Long term disability forms received from [employer]. Forms were put in Dr.Kelly box to be reviewed.

## 2021-02-13 DIAGNOSIS — Z131 Encounter for screening for diabetes mellitus: Secondary | ICD-10-CM | POA: Diagnosis not present

## 2021-02-13 NOTE — Telephone Encounter (Signed)
Patient returned call, stating that she is only PRN at [facility], and she is already out on long term disability per her ortho doctor, but they have to update her forms for all the providers that see her or have seen her in the past year.  I advised I would notify Dr.Kelly of this next week when he returns and we would let her know.  Patient verbalized understanding.

## 2021-02-13 NOTE — Telephone Encounter (Signed)
I contacted patient to discuss forms that were sent in.  CATH/STENTS placed in November 2021. I did not see any other LTD forms in the system that were filled out previously.   Left message to call back. Left call back number.

## 2021-02-26 ENCOUNTER — Telehealth: Payer: Self-pay | Admitting: Cardiovascular Disease

## 2021-02-26 NOTE — Telephone Encounter (Signed)
02/06/21  Long term disability forms received from [employer]. Forms were put in Dr.Kelly box to be reviewed.  

## 2021-02-28 NOTE — Telephone Encounter (Signed)
12/21 Patient disability forms complete, patient is on disability from orthopedic doctor but disability company needed forms filled out from patient care team providers. Forms faxed

## 2021-03-19 DIAGNOSIS — E119 Type 2 diabetes mellitus without complications: Secondary | ICD-10-CM | POA: Diagnosis not present

## 2021-03-19 DIAGNOSIS — H2513 Age-related nuclear cataract, bilateral: Secondary | ICD-10-CM | POA: Diagnosis not present

## 2021-03-19 DIAGNOSIS — H04123 Dry eye syndrome of bilateral lacrimal glands: Secondary | ICD-10-CM | POA: Diagnosis not present

## 2021-03-19 DIAGNOSIS — H5203 Hypermetropia, bilateral: Secondary | ICD-10-CM | POA: Diagnosis not present

## 2021-03-19 LAB — HM DIABETES EYE EXAM

## 2021-03-21 ENCOUNTER — Telehealth: Payer: Self-pay | Admitting: Cardiovascular Disease

## 2021-03-21 NOTE — Telephone Encounter (Signed)
Returned patient call.  She reports mostly fever off/on with sore throat, aches and fatigue.  Wanted to know if Coricidin would be helpful.  Has been taking acetaminophen, but nothing more than short term relief.   No cough or congestion.  Notes that she has appointment with PCP in the morning.   Advised to continue with acetaminophen tonight.  Can use salt water gargle if throat is sore.  Keep appointment in AM to determine if flu/Covid.

## 2021-03-21 NOTE — Telephone Encounter (Signed)
PT HAS HAD A COLD SINCE Monday, SHE IS WANTING TO KNOW WHICH COLD MEDS SHE IS ALLOWED TO TAKE. PLEASE ADVISE THE PT FURTHER

## 2021-03-21 NOTE — Telephone Encounter (Signed)
Spoke with patient who reports having "a cold, chills, sore throat, fever on & off, feeling exhausted, and having no energy since this past Monday." She did a home COVID test on Monday and Tuesday, which were negative. She has had the flu vaccine. She wants to know what OTC medication she can take with regard to the medications she is currently taking. I told her I would send this message to our PharmD and recommended she contact her PCP.

## 2021-03-22 DIAGNOSIS — R509 Fever, unspecified: Secondary | ICD-10-CM | POA: Diagnosis not present

## 2021-03-22 DIAGNOSIS — Z1152 Encounter for screening for COVID-19: Secondary | ICD-10-CM | POA: Diagnosis not present

## 2021-03-22 DIAGNOSIS — R0981 Nasal congestion: Secondary | ICD-10-CM | POA: Diagnosis not present

## 2021-03-22 DIAGNOSIS — R5383 Other fatigue: Secondary | ICD-10-CM | POA: Diagnosis not present

## 2021-03-22 DIAGNOSIS — J029 Acute pharyngitis, unspecified: Secondary | ICD-10-CM | POA: Diagnosis not present

## 2021-03-22 DIAGNOSIS — J0100 Acute maxillary sinusitis, unspecified: Secondary | ICD-10-CM | POA: Diagnosis not present

## 2021-04-10 ENCOUNTER — Other Ambulatory Visit (HOSPITAL_BASED_OUTPATIENT_CLINIC_OR_DEPARTMENT_OTHER): Payer: Self-pay

## 2021-04-10 MED ORDER — TRULICITY 1.5 MG/0.5ML ~~LOC~~ SOAJ
1.5000 mg | SUBCUTANEOUS | 5 refills | Status: DC
  Filled 2021-04-10: qty 2, 28d supply, fill #0

## 2021-04-11 ENCOUNTER — Other Ambulatory Visit (HOSPITAL_BASED_OUTPATIENT_CLINIC_OR_DEPARTMENT_OTHER): Payer: Self-pay

## 2021-04-11 ENCOUNTER — Other Ambulatory Visit: Payer: Self-pay | Admitting: Cardiovascular Disease

## 2021-04-11 MED ORDER — METOPROLOL TARTRATE 25 MG PO TABS
25.0000 mg | ORAL_TABLET | Freq: Two times a day (BID) | ORAL | 1 refills | Status: DC
  Filled 2021-04-11: qty 180, 90d supply, fill #0

## 2021-04-19 ENCOUNTER — Other Ambulatory Visit (HOSPITAL_BASED_OUTPATIENT_CLINIC_OR_DEPARTMENT_OTHER): Payer: Self-pay

## 2021-04-19 ENCOUNTER — Ambulatory Visit: Payer: PPO | Admitting: Cardiovascular Disease

## 2021-04-19 ENCOUNTER — Other Ambulatory Visit: Payer: Self-pay

## 2021-04-19 ENCOUNTER — Encounter: Payer: Self-pay | Admitting: Cardiovascular Disease

## 2021-04-19 DIAGNOSIS — E118 Type 2 diabetes mellitus with unspecified complications: Secondary | ICD-10-CM

## 2021-04-19 DIAGNOSIS — I10 Essential (primary) hypertension: Secondary | ICD-10-CM | POA: Diagnosis not present

## 2021-04-19 DIAGNOSIS — G478 Other sleep disorders: Secondary | ICD-10-CM

## 2021-04-19 DIAGNOSIS — I214 Non-ST elevation (NSTEMI) myocardial infarction: Secondary | ICD-10-CM | POA: Diagnosis not present

## 2021-04-19 DIAGNOSIS — E785 Hyperlipidemia, unspecified: Secondary | ICD-10-CM

## 2021-04-19 MED ORDER — CLOPIDOGREL BISULFATE 75 MG PO TABS
75.0000 mg | ORAL_TABLET | Freq: Every day | ORAL | 3 refills | Status: DC
  Filled 2021-04-19: qty 90, 90d supply, fill #0
  Filled 2021-10-15: qty 90, 90d supply, fill #1
  Filled 2022-01-10: qty 90, 90d supply, fill #2
  Filled 2022-04-08: qty 90, 90d supply, fill #3

## 2021-04-19 MED ORDER — METOPROLOL SUCCINATE ER 50 MG PO TB24
50.0000 mg | ORAL_TABLET | Freq: Every day | ORAL | 3 refills | Status: DC
  Filled 2021-04-19: qty 90, 90d supply, fill #0
  Filled 2021-10-15: qty 90, 90d supply, fill #1
  Filled 2022-01-10: qty 90, 90d supply, fill #2
  Filled 2022-04-08: qty 90, 90d supply, fill #3

## 2021-04-19 NOTE — Patient Instructions (Signed)
Medication Instructions:  TAKE all of  current Brilinta then stop START Plavix 75 mg daily after completing Brillinta  Take all of remaining Metoprolol Tartrate (Lopressor) 25 mg 2 times a day then stop  START Metoprolol Succinate (Toprol-XL) 50 mg daily  *If you need a refill on your cardiac medications before your next appointment, please call your pharmacy*  Lab Work: Your physician recommends that you return for lab work TOMORROW :  CMP FLP  If you have labs (blood work) drawn today and your tests are completely normal, you will receive your results only by: [place] (if you have MyChart) OR A paper copy in the mail If you have any lab test that is abnormal or we need to change your treatment, we will call you to review the results.  Testing/Procedures: NONE ordered at this time of appointment   Follow-Up: At [facility], you and your health needs are our priority.  As part of our continuing mission to provide you with exceptional heart care, we have created designated Provider Care Teams.  These Care Teams include your primary Cardiologist (physician) and Advanced Practice Providers (APPs -  Physician Assistants and Nurse Practitioners) who all work together to provide you with the care you need, when you need it.   Your next appointment:   6 month(s)  The format for your next appointment:   In Person  Provider:   Shelva Majestic, MD    Other Instructions

## 2021-04-19 NOTE — Progress Notes (Signed)
° °Cardiology Office Note   ° °Date:  04/19/2021  ° °ID:  Christina Larsen, DOB 10/25/1955, MRN [id number] ° °PCP:  Aronson, Richard, MD  °Cardiologist:  Thomas Kelly, MD  ° °6 month  F/U office visit ° °History of Present Illness:  °Christina Larsen is a 65 y.o. female who is originally from [place].  She has a history of diabetes mellitus, hypertension, hyperlipidemia, and tobacco use.  She had undergone initial rotator cuff surgery in June 2021 and underwent a redo surgery at the end of October.  While watching [place], she had developed significant chest pain and presented to [facility] where ECG showed anteroseptal ST depression.  Troponin was elevated to 85 > 860.  She was given aspirin and started on heparin and nitroglycerin.  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  She required aspiration thrombectomy for significant clot burden and underwent successful stenting of her proximal circumflex vessel.  There was distal circumflex disease treated medically.  I had seen her during her hospitalization.  We had a lengthy discussion regarding absolute smoking cessation and since her presentation she has not had any further cigarettes.  She has been undergoing physical therapy for her redo shoulder surgery.  She was seen for initial post hospital follow-up on November 30 by Lori Gearhart, NP. ° °I saw her for my initial office evaluation on March 14, 2020.  At that time she was feeling well and denied recurrent chest pain symptomatology and was unaware of palpitations.  She continued to be on aspirin and Brilinta for DAPT for minimum of 1 year, and is on metoprolol tartrate 25 mg twice a day.  She was tolerating  atorvastatin 80 mg daily and was on metformin and Trulicity for her diabetes mellitus.  She has peripheral neuropathy and is on gabapentin.   ° °She presented to the emergency room on May 07, 2020 with complaints of intermittent chest tightness.  Her chest pain was intermittent and not exertionally precipitated.  Troponins were negative.  Chest x-ray did not reveal acute abnormalities.  The chest pain was a stabbing pain to the right shoulder and was not felt to be ischemic in etiology.  She was subsequently evaluated by Jesse Cleaver on May 09, 2020 and remained stable. ° °I last saw her on Jul 24, 2020.  On May 12 while walking her dog she had an episode of vertigo.  She was unaware of palpitations.  She denied any recurrent chest pain symptoms or exertional dyspnea.  She admits to poor sleep.  She also has experienced fatigue and daytime sleepiness.  An Epworth Sleepiness Scale score was therefore calculated in the office today and this endorsed at 16 consistent with excessive daytime sleepiness. She completed cardiac rehab.  She is now participating in Silver sneaker program at the gym.  During her evaluation, I felt she was stable with reference to CAD.  I raise concern for possible sleep apnea which may be contributing to her significant excessive daytime sleepiness and with her poor sleep and cardiovascular comorbidities a sleep study was recommended. ° °She underwent a diagnostic polysomnogram in October 16, 2020.  Her AHI was 0 but she did have increased respiratory disturbance index of 12.5.  There was no   was no significant oxygen desaturation.  She had moderate snoring throughout the study.  He was felt most likely that she had increased upper airway resistance syndrome.  She was also noted to have periodic limb movement during sleep with associated arousals with an index of 10.5/h.  I last saw on November 02, 2020.  At that time she was continuing to go to the  Silver sneakers program.  She denied any  recurrent chest pain.  She continues to be on DAPT with aspirin/Brilinta she is on atorvastatin 80 mg for aggressive lipid-lowering therapy.  She is diabetic on metformin and Trulicity.  She has neuropathy on gabapentin.  Remotely, she did try medication in 2006 for restless legs.    Since I saw her, she has been without anginal symptomatology.  She denies shortness of breath.  She has been off tobacco since her presentation in November 2021.Marland Kitchen She was wondering about longevity of continuing Brilinta due to cost.  She continues to be on atorvastatin 80 mg for hyperlipidemia.  She was on metoprolol 25 mg twice a day.  With her diabetes she was on metformin in addition to Trulicity.  She has been experiencing left shoulder discomfort mainly.  She is status post 2 prior surgeries to her right shoulder.  She presents for follow-up evaluation.   Past Medical History:  Diagnosis Date   Coronary artery disease    11/9 NSTEMI, PCI to pLCx with aspiration thrombectomy/DESx1, occluded mRCA with left to right collaterals   Diabetes mellitus without complication ([place])    HLD (hyperlipidemia)     Past Surgical History:  Procedure Laterality Date   [place]   CORONARY STENT INTERVENTION N/A 01/17/2020   Procedure: CORONARY STENT INTERVENTION;  Surgeon: Nelva Bush, MD;  Location: [facility];  Service: Cardiovascular;  Laterality: N/A;   LAMINECTOMY  2006   LEFT HEART CATH AND CORONARY ANGIOGRAPHY N/A 01/17/2020   Procedure: LEFT HEART CATH AND CORONARY ANGIOGRAPHY;  Surgeon: Nelva Bush, MD;  Location: [facility];  Service: Cardiovascular;  Laterality: N/A;   rotator cuff surgery      Current Medications: Outpatient Medications Prior to Visit  Medication Sig Dispense Refill   aspirin (ASPIRIN ADULT LOW STRENGTH) 81 MG EC tablet TAKE 1 TABLET (81 MG TOTAL) BY MOUTH DAILY. SWALLOW WHOLE. 30 tablet 10   atorvastatin (LIPITOR) 80 MG tablet  TAKE 1 TABLET BY MOUTH ONCE DAILY 90 tablet 3   Dulaglutide (TRULICITY) 1.5 MG/8.6PY SOPN Inject 1.5 mg into the skin once a week. 2 mL 5   gabapentin (NEURONTIN) 300 MG capsule Take 300 mg by mouth as needed (for nerve pain).     metFORMIN (GLUCOPHAGE) 1000 MG tablet TAKE 1 TABLET BY MOUTH 2 TIMES DAILY WITH MEALS 180 tablet 3   Multiple Vitamin (MULTIVITAMIN) tablet Take 1 tablet by mouth daily.     metoprolol tartrate (LOPRESSOR) 25 MG tablet Take 1 tablet (25 mg total) by mouth 2 (two) times daily. 180 tablet 1   ticagrelor (BRILINTA) 90 MG TABS tablet TAKE 1 TABLET BY MOUTH TWICE DAILY 180 tablet 3   nitroGLYCERIN (NITROSTAT) 0.4 MG SL tablet PLACE 1 TABLET UNDER THE TONGUE EVERY 5 MINUTES AS NEEDED 25 tablet 2   No facility-administered medications prior to visit.     Allergies:   Hydrocodone-acetaminophen   Social History   Socioeconomic History   Marital status: Widowed    Spouse name: Not on file   Number  1  ° Years of education: 15  ° Highest education level: Not on file  °Occupational History  ° Occupation: [employer]  °Tobacco Use  ° Smoking status: Former  °  Types: Cigarettes  °  Quit date: 01/16/2020  °  Years since quitting: 1.2  ° Smokeless tobacco: Never  °Vaping Use  ° Vaping Use: Never used  °Substance and Sexual Activity  ° Alcohol use: Yes  °  Alcohol/week: 4.0 standard drinks  °  Types: 4 Glasses of wine per week  ° Drug use: Never  ° Sexual activity: Not Currently  °Other Topics Concern  ° Not on file  °Social History Narrative  ° Semi Retired  ° °Social Determinants of Health  ° °Financial Resource Strain: Not on file  °Food Insecurity: Not on file  °Transportation Needs: Not on file  °Physical Activity: Not on file  °Stress: Not on file  °Social Connections: Not on file  °  °Socially she lives by herself.  She quit smoking the day of her heart attack.  She had been divorced from her husband who subsequently has died.  She has children. ° °Family History:   The patient's  °family history includes Colon cancer in her mother; Diabetes in her mother; Hypercholesterolemia in her father; Hypertension in her brother; Prostate cancer in her father.  ° °ROS °General: Negative; No fevers, chills, or night sweats;  °HEENT: Negative; No changes in vision or hearing, sinus congestion, difficulty swallowing °Pulmonary: Negative; No cough, wheezing, shortness of breath, hemoptysis °Cardiovascular: See HPI °GI: Negative; No nausea, vomiting, diarrhea, or abdominal pain °GU: Negative; No dysuria, hematuria, or difficulty voiding °Musculoskeletal: Negative; no myalgias, joint pain, or weakness °Hematologic/Oncology: Negative; no easy bruising, bleeding °Endocrine: Negative; no heat/cold intolerance; no diabetes °Neuro: Negative; no changes in balance, headaches °Skin: Negative; No rashes or skin lesions °Psychiatric: Negative; No behavioral problems, depression °Sleep: Poor sleep, fatigue, daytime sleepiness, snoring; no bruxism, restless legs, hypnogognic hallucinations, no cataplexy °Other comprehensive 14 point system review is negative. ° ° °PHYSICAL EXAM:   °VS:  BP 124/72    Pulse 78    Ht 5' 5" (1.651 m)    Wt 162 lb 3.2 oz (73.6 kg)    SpO2 97%    BMI 26.99 kg/m²    ° °Repeat blood pressure by me was 128/76 ° °Wt Readings from Last 3 Encounters:  °04/19/21 162 lb 3.2 oz (73.6 kg)  °11/02/20 156 lb (70.8 kg)  °10/16/20 160 lb (72.6 kg)  °  °General: Alert, oriented, no distress.  °Skin: normal turgor, no rashes, warm and dry °HEENT: Normocephalic, atraumatic. Pupils equal round and reactive to light; sclera anicteric; extraocular muscles intact;  °Nose without nasal septal hypertrophy °Mouth/Parynx benign; Mallinpatti scale 3 °Neck: No JVD, no carotid bruits; normal carotid upstroke °Lungs: clear to ausculatation and percussion; no wheezing or rales °Chest wall: without tenderness to palpitation °Heart: PMI not displaced, RRR, s1 s2 normal, 1/6 systolic murmur, no diastolic  murmur, no rubs, gallops, thrills, or heaves °Abdomen: soft, nontender; no hepatosplenomehaly, BS+; abdominal aorta nontender and not dilated by palpation. °Back: no CVA tenderness °Pulses 2+ °Musculoskeletal: full range of motion, normal strength, no joint deformities °Extremities: no clubbing cyanosis or edema, Homan's sign negative  °Neurologic: grossly nonfocal; Cranial nerves grossly wnl °Psychologic: Normal mood and affect ° ° °Studies/Labs Reviewed:  ° °April 19, 2021 ECG (independently read by me):  NSR at 78, IRBBB, no ectopy ° °November 02, 2020 ECG (independently read by me): Normal sinus   Normal sinus rhythm at 72 bpm, left axis deviation.  No ectopy.  No ST segment changes.  Jul 24, 2020 ECG (independently read by me): NSR at 72; Left axis deviation  January 2022 ECG (independently read by me): Normal sinus rhythm at 68 bpm.  Left axis deviation.  Nondiagnostic T wave abnormality.  Recent Labs: BMP Latest Ref Rng & Units 05/07/2020 03/15/2020 02/08/2020  Glucose 70 - 99 mg/dL 134(H) 120(H) 216(H)  BUN 8 - 23 mg/dL _0 Creatinine 0.44 - 1.00 mg/dL 0.61 0.57 0.68  BUN/Creat Ratio 12 - 28 - 25 19  Sodium 135 - 145 mmol/L 139 140 141  Potassium 3.5 - 5.1 mmol/L 3.9 4.2 3.8  Chloride 98 - 111 mmol/L 105 103 105  CO2 22 - 32 mmol/L _1 Calcium 8.9 - 10.3 mg/dL 9.7 9.7 9.5     Hepatic Function Latest Ref Rng & Units 05/07/2020 03/15/2020  Total Protein 6.5 - 8.1 g/dL 7.3 7.0  Albumin 3.5 - 5.0 g/dL 4.2 4.5  AST 15 - 41 U/L 26 21  ALT 0 - 44 U/L 27 28  Alk Phosphatase 38 - 126 U/L 67 97  Total Bilirubin 0.3 - 1.2 mg/dL 0.8 0.7    CBC Latest Ref Rng & Units 05/07/2020 02/08/2020 01/18/2020  WBC 4.0 - 10.5 K/uL 9.4 9.3 14.5(H)  Hemoglobin 12.0 - 15.0 g/dL 15.0 14.0 13.0  Hematocrit 36.0 - 46.0 % 44.1 40.7 39.1  Platelets 150 - 400 K/uL 222 204 192   Lab Results  Component Value Date   MCV 94.0 05/07/2020   MCV 91 02/08/2020   MCV 94.4 01/18/2020   Lab Results  Component Value Date    TSH 0.535 03/15/2020   Lab Results  Component Value Date   HGBA1C 7.3 (H) 01/17/2020     BNP No results found for: BNP  ProBNP No results found for: PROBNP   Lipid Panel     Component Value Date/Time   CHOL 117 03/15/2020 0812   TRIG 130 03/15/2020 0812   HDL 42 03/15/2020 0812   CHOLHDL 2.8 03/15/2020 0812   CHOLHDL 4.2 01/17/2020 1206   VLDL 37 01/17/2020 1206   LDLCALC 52 03/15/2020 0812   LABVLDL 23 03/15/2020 0812     RADIOLOGY: No results found.   Additional studies/ records that were reviewed today include:   01/17/2020 Conclusions: Significant two-vessel coronary artery disease with occlusions of the proximal LCx (acute with heavy thrombus burden) and mid RCA (chronic with left-to-right collaterals).  Moderate, non-obstructive coronary artery disease is also present in the ramus intermedius and distal LCx (codominant vessel). Mid LAD myocardial bridge. Mildly elevated left ventricular filling pressure with lateral wall hypokinesis and mildly reduced contraction. Successful PCI to proximal LCx with aspiration thrombectomy and placement of Resolute Onyx 3.0 x 18 mm drug-eluting stent (postdilated to 3.3 mm) with 0% residual stenosis and TIMI-3 flow.   Recommendations: Dual antiplatelet therapy with aspirin and ticagrelor for at least 12 months. Aggressive secondary prevention, including high-intensity statin therapy and smoking cessation. Medical management of chronic total occlusion of mid RCA as well as moderate ramus/distal LCx disease. Remove right femoral artery sheath with manual compression two hours after discontinuation of bivalirudin  Intervention      ECHO IMPRESSIONS   1. Left ventricular ejection fraction, by estimation, is 55%. The left  ventricle has normal function. The left ventricle has no regional wall  motion abnormalities. Left ventricular diastolic parameters were normal.   2. Right  ventricular systolic function is normal. The  right ventricular  size is normal.   3. The mitral valve is normal in structure. No evidence of mitral valve  regurgitation. No evidence of mitral stenosis.   4. The aortic valve is normal in structure. Aortic valve regurgitation is  not visualized. Mild aortic valve sclerosis is present, with no evidence  of aortic valve stenosis.   5. Aortic dilatation noted. There is mild dilatation of the ascending  aorta, measuring 40 mm.   6. The inferior vena cava is normal in size with greater than 50%  respiratory variability, suggesting right atrial pressure of 3 mmHg.    ASSESSMENT:    1. Non-ST elevation (NSTEMI) myocardial infarction [facility]): January 16, 2021, DES stent to LCX   2. Essential hypertension   3. Hyperlipidemia with target LDL less than 70   4. Type 2 diabetes mellitus with complication, without long-term current use of insulin ([place])   5. UARS (upper airway resistance syndrome)     PLAN:  [name] is a very pleasant 66 year old female who is originally from [place].  She has a history of previous significant tobacco use, hypertension, diabetes mellitus, as well as hyperlipidemia.  She developed an acute coronary syndrome 1 week following her redo right shoulder surgery and was found to have thrombotic occlusion of her proximal circumflex in November 2021.  Catheterization also revealed a chronic total occlusion of RCA which was well collateralized via septal perforators arising from the LAD.  She also had mild concomitant CAD in her LAD, moderate in the ramus intermediate, and in her proximal RCA.  She has not had any recurrent anginal symptomatology and underwent successful DES stenting after thrombectomy of her proximal circumflex.  Since her initial post hospital office visit with me, she apparently presented to the emergency room in February 2022 with atypical chest pain that was described as a stabbing sharp discomfort radiating to her right shoulder.  This most likely was  musculoskeletal in etiology.  She has not had recurrent symptoms.  She continues to be on DAPT following her STEMI and is tolerating this well.  She is in the [employer] assistance program and I filled out paperwork today so that she can continue to obtain Brilinta in this program.  Continues to be a potency statin therapy with atorvastatin 80 mg most  LDL cholesterol  in January 2022 was 52.  Only, she is asymptomatic without recurrent chest pain and denies any shortness of breath.  She is concerned about the cost of Brilinta.  I have recommended that since she is over a year since her intervention but with her concomitant CAD I would like her to continue DAPT.  When she runs out of her most recent medications, I will switch her to generic clopidogrel 75 mg daily.  In addition, she was wondering about having to take metoprolol twice a day.  I have suggested that we discontinue metoprolol tartrate and in its place I will switch her to metoprolol succinate 50 mg daily.  She will continue to exercise.  She continues to be on atorvastatin.  She has not had recent chemistry or lipid studies and I will schedule her for a comprehensive metabolic panel and fasting lipid panel in the fasting state.  She tells me Dr. Reynaldo Minium will be rechecking a hemoglobin seen in a month.  I will see her in 6 months for Cardiologic follow-up or sooner as needed.   Medication Adjustments/Labs and Tests Ordered: Current medicines are reviewed  with the patient today.  Concerns regarding medicines are outlined above.  Medication changes, Labs and Tests ordered today are listed in the Patient Instructions below. °Patient Instructions  °Medication Instructions:  °TAKE all of  current Brilinta then stop °START Plavix 75 mg daily after completing Brillinta  °Take all of remaining Metoprolol Tartrate (Lopressor) 25 mg 2 times a day then stop  °START Metoprolol Succinate (Toprol-XL) 50 mg daily  °*If you need a refill on your cardiac  medications before your next appointment, please call your pharmacy* ° °Lab Work: °Your physician recommends that you return for lab work TOMORROW :  °CMP °FLP  °If you have labs (blood work) drawn today and your tests are completely normal, you will receive your results only by: °MyChart Message (if you have MyChart) OR °A paper copy in the mail °If you have any lab test that is abnormal or we need to change your treatment, we will call you to review the results. ° °Testing/Procedures: °NONE ordered at this time of appointment  ° °Follow-Up: °At [facility], you and your health needs are our priority.  As part of our continuing mission to provide you with exceptional heart care, we have created designated Provider Care Teams.  These Care Teams include your primary Cardiologist (physician) and Advanced Practice Providers (APPs -  Physician Assistants and Nurse Practitioners) who all work together to provide you with the care you need, when you need it. ° ° °Your next appointment:   °6 month(s) ° °The format for your next appointment:   °In Person ° °Provider:   °Thomas Kelly, MD   ° °Other Instructions ° °  ° °Signed, °Thomas Kelly, MD  °04/19/2021 12:50 PM    °[facility] °[address], [address], , [place]  [address] °Phone: [phone]  °  °

## 2021-04-20 DIAGNOSIS — I10 Essential (primary) hypertension: Secondary | ICD-10-CM | POA: Diagnosis not present

## 2021-04-20 LAB — LIPID PANEL
Chol/HDL Ratio: 2.4 ratio (ref 0.0–4.4)
Cholesterol, Total: 114 mg/dL (ref 100–199)
HDL: 47 mg/dL (ref >39)
LDL Chol Calc (NIH): 38 mg/dL (ref 0–99)
Triglycerides: 181 mg/dL — ABNORMAL HIGH (ref 0–149)
VLDL Cholesterol Cal: 29 mg/dL (ref 5–40)

## 2021-04-20 LAB — COMPREHENSIVE METABOLIC PANEL
ALT: 17 IU/L (ref 0–32)
AST: 15 IU/L (ref 0–40)
Albumin/Globulin Ratio: 1.9 (ref 1.2–2.2)
Albumin: 4.5 g/dL (ref 3.8–4.8)
Alkaline Phosphatase: 85 IU/L (ref 44–121)
BUN/Creatinine Ratio: 17 (ref 12–28)
BUN: 12 mg/dL (ref 8–27)
Bilirubin Total: 0.6 mg/dL (ref 0.0–1.2)
CO2: 22 mmol/L (ref 20–29)
Calcium: 9.4 mg/dL (ref 8.7–10.3)
Chloride: 102 mmol/L (ref 96–106)
Creatinine, Ser: 0.69 mg/dL (ref 0.57–1.00)
Globulin, Total: 2.4 g/dL (ref 1.5–4.5)
Glucose: 174 mg/dL — ABNORMAL HIGH (ref 70–99)
Potassium: 5 mmol/L (ref 3.5–5.2)
Sodium: 141 mmol/L (ref 134–144)
Total Protein: 6.9 g/dL (ref 6.0–8.5)
eGFR: 95 mL/min/{1.73_m2} (ref >59)

## 2021-05-15 ENCOUNTER — Other Ambulatory Visit (HOSPITAL_BASED_OUTPATIENT_CLINIC_OR_DEPARTMENT_OTHER): Payer: Self-pay

## 2021-05-15 ENCOUNTER — Other Ambulatory Visit: Payer: Self-pay | Admitting: Cardiovascular Disease

## 2021-05-15 MED ORDER — ATORVASTATIN CALCIUM 80 MG PO TABS
ORAL_TABLET | Freq: Every day | ORAL | 3 refills | Status: DC
  Filled 2021-05-15: qty 90, 90d supply, fill #0
  Filled 2021-08-13: qty 90, 90d supply, fill #1
  Filled 2021-10-28: qty 90, 90d supply, fill #2
  Filled 2022-01-10: qty 90, 90d supply, fill #3

## 2021-05-16 ENCOUNTER — Other Ambulatory Visit (HOSPITAL_BASED_OUTPATIENT_CLINIC_OR_DEPARTMENT_OTHER): Payer: Self-pay

## 2021-05-17 ENCOUNTER — Other Ambulatory Visit (HOSPITAL_BASED_OUTPATIENT_CLINIC_OR_DEPARTMENT_OTHER): Payer: Self-pay

## 2021-05-17 MED ORDER — METFORMIN HCL 1000 MG PO TABS
ORAL_TABLET | ORAL | 3 refills | Status: DC
  Filled 2021-05-17: qty 180, 90d supply, fill #0
  Filled 2021-08-13: qty 180, 90d supply, fill #1
  Filled 2021-10-28: qty 180, 90d supply, fill #2
  Filled 2022-01-10: qty 180, 90d supply, fill #3

## 2021-05-25 ENCOUNTER — Other Ambulatory Visit (HOSPITAL_BASED_OUTPATIENT_CLINIC_OR_DEPARTMENT_OTHER): Payer: Self-pay

## 2021-05-25 DIAGNOSIS — M7541 Impingement syndrome of right shoulder: Secondary | ICD-10-CM | POA: Diagnosis not present

## 2021-05-25 MED ORDER — METHOCARBAMOL 500 MG PO TABS
ORAL_TABLET | ORAL | 0 refills | Status: DC
  Filled 2021-05-25: qty 40, 10d supply, fill #0

## 2021-05-25 MED ORDER — GABAPENTIN 300 MG PO CAPS
300.0000 mg | ORAL_CAPSULE | Freq: Two times a day (BID) | ORAL | 0 refills | Status: DC
  Filled 2021-05-25: qty 30, 15d supply, fill #0

## 2021-06-14 ENCOUNTER — Telehealth: Payer: Self-pay

## 2021-06-14 NOTE — Telephone Encounter (Signed)
Received notification that Brilinta was sent out today- I see Dr.Kelly reviewed at last visit switched her to plavix. Will route to see what he would recommend she do.  ? ?Thanks! ? ?

## 2021-06-22 NOTE — Telephone Encounter (Signed)
At her last office visit she was concerned about the cost of [place].  As result I recommended that she that when she ran out of her Brilinta we could switch her to clopidogrel predominantly due to cost issue ?

## 2021-06-28 NOTE — Telephone Encounter (Signed)
Contacted patient, she advised she did received more Brilinta- however she called them and they are sending her a label to send back the medications.  ?She is aware to start the Plavix once she finishes what she has.  ? ?Patient thankful for call back.  ? ?

## 2021-08-13 ENCOUNTER — Other Ambulatory Visit (HOSPITAL_BASED_OUTPATIENT_CLINIC_OR_DEPARTMENT_OTHER): Payer: Self-pay

## 2021-09-27 ENCOUNTER — Encounter: Payer: Self-pay | Admitting: Family Medicine

## 2021-09-27 ENCOUNTER — Encounter: Payer: Self-pay | Admitting: *Deleted

## 2021-09-27 ENCOUNTER — Ambulatory Visit (INDEPENDENT_AMBULATORY_CARE_PROVIDER_SITE_OTHER): Payer: PPO | Admitting: Family Medicine

## 2021-09-27 VITALS — BP 121/81 | HR 91 | Ht 65.0 in | Wt 166.0 lb

## 2021-09-27 DIAGNOSIS — E785 Hyperlipidemia, unspecified: Secondary | ICD-10-CM | POA: Diagnosis not present

## 2021-09-27 DIAGNOSIS — Z13820 Encounter for screening for osteoporosis: Secondary | ICD-10-CM | POA: Diagnosis not present

## 2021-09-27 DIAGNOSIS — I2510 Atherosclerotic heart disease of native coronary artery without angina pectoris: Secondary | ICD-10-CM | POA: Insufficient documentation

## 2021-09-27 DIAGNOSIS — E119 Type 2 diabetes mellitus without complications: Secondary | ICD-10-CM

## 2021-09-27 NOTE — Assessment & Plan Note (Signed)
Following with cardiology. No acute concerns today. Continue current regimen.  

## 2021-09-27 NOTE — Assessment & Plan Note (Signed)
-  Medication management: continue lipitor  -Repeat CMP and lipid panel ordered -Diet low in saturated fat -Regular exercise - at least 30 minutes, 5 times per week

## 2021-09-27 NOTE — Progress Notes (Signed)
New Patient Office Visit  Subjective    Patient ID: Christina Larsen, female    DOB: Mar 26, 1955  Age: 66 y.o. MRN: [id number]  CC:  establish care    HPI Christina Larsen presents to establish care. 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 Former PCP was at [facility] - she has requested records to be sent here.   Patient reports doing well overall. No concerns today.   She reports history of rotator cuff repair in June 2021 with additional tears during rehab, leading to repeat surgery in November 2021. About a week after surgery she ended up with an NSTEMI and has stent placed at [facility]. She follows with [facility] cardiology every 6 months. She is currently on aspirin, plavix, lipitor, and metoprolol.  DM2 - She has been well controlled with A1c last year <8%. Currently taking Trulicity 1.5 mg weekly (she gets medication assistance and will be bringing [place] forms at the end of the year to fill out). She denies any polyuria, polydipsia, polyphagia.     Outpatient Encounter Medications as of 09/27/2021  Medication Sig   aspirin (ASPIRIN ADULT LOW STRENGTH) 81 MG EC tablet TAKE 1 TABLET (81 MG TOTAL) BY MOUTH DAILY. SWALLOW WHOLE.   atorvastatin (LIPITOR) 80 MG tablet TAKE 1 TABLET BY MOUTH ONCE DAILY   clopidogrel (PLAVIX) 75 MG tablet Take 1 tablet (75 mg total) by mouth daily.   Dulaglutide (TRULICITY) 1.5 MG/0.5ML SOPN Inject 1.5 mg into the skin once a week.   gabapentin (NEURONTIN) 300 MG capsule Take 1 capsule (300 mg total) by mouth every 12 (twelve) hours as needed.   metFORMIN (GLUCOPHAGE) 1000 MG tablet TAKE 1 TABLET BY MOUTH TWICE DAILY FOR 90 DAYS   methocarbamol (ROBAXIN) 500 MG tablet Take 1 tablet every 6-8 hours by oral route as needed for spasm.   metoprolol succinate (TOPROL XL) 50 MG 24 hr tablet Take 1 tablet (50 mg total) by mouth daily.   Multiple Vitamin (MULTIVITAMIN) tablet Take 1 tablet by mouth daily.   nitroGLYCERIN  (NITROSTAT) 0.4 MG SL tablet PLACE 1 TABLET UNDER THE TONGUE EVERY 5 MINUTES AS NEEDED   [DISCONTINUED] gabapentin (NEURONTIN) 300 MG capsule Take 300 mg by mouth as needed (for nerve pain).   [DISCONTINUED] metFORMIN (GLUCOPHAGE) 1000 MG tablet TAKE 1 TABLET BY MOUTH 2 TIMES DAILY WITH MEALS   No facility-administered encounter medications on file as of 09/27/2021.    Past Medical History:  Diagnosis Date   Allergy    Coronary artery disease    11/9 NSTEMI, PCI to pLCx with aspiration thrombectomy/DESx1, occluded mRCA with left to right collaterals   Diabetes mellitus without complication (HCC)    HLD (hyperlipidemia)    Hypertension    Myocardial infarction [facility])     Past Surgical History:  Procedure Laterality Date   CARDIAC CATHETERIZATION     CHOLECYSTECTOMY  1992   CORONARY STENT INTERVENTION N/A 01/17/2020   Procedure: CORONARY STENT INTERVENTION;  Surgeon: Yvonne Kendall, MD;  Location: [facility] INVASIVE CV LAB;  Service: Cardiovascular;  Laterality: N/A;   LAMINECTOMY  2006   LEFT HEART CATH AND CORONARY ANGIOGRAPHY N/A 01/17/2020   Procedure: LEFT HEART CATH AND CORONARY ANGIOGRAPHY;  Surgeon: Yvonne Kendall, MD;  Location: [facility] INVASIVE CV LAB;  Service: Cardiovascular;  Laterality: N/A;   rotator cuff surgery      Family History  Problem Relation Age of Onset   Arthritis Mother    Diabetes Mother  Colon cancer Mother    Heart disease Father    Prostate cancer Father    Hypercholesterolemia Father    Cancer Brother    Diabetes Brother    Hyperlipidemia Brother    Hypertension Brother     Social History   Socioeconomic History   Marital status: Widowed    Spouse name: Not on file   Number of children: 1   Years of education: 15   Highest education level: Not on file  Occupational History   Occupation: [employer]  Tobacco Use   Smoking status: Former    Types: Cigarettes    Quit date: 01/16/2020    Years since quitting: 1.6   Smokeless tobacco: Never   Vaping Use   Vaping Use: Never used  Substance and Sexual Activity   Alcohol use: Yes    Alcohol/week: 4.0 - 6.0 standard drinks of alcohol    Types: 3 - 4 Glasses of wine, 1 - 2 Shots of liquor per week   Drug use: Never   Sexual activity: Not Currently  Other Topics Concern   Not on file  Social History Narrative   Semi Retired   International aid/development worker of [employer] Strain: Not on file  Food Insecurity: Not on file  Transportation Needs: Not on file  Physical Activity: Not on file  Stress: Not on file  Social Connections: Not on file  Intimate Partner Violence: Not on file    ROS All review of systems negative except what is listed in the HPI      Objective    BP 121/81   Pulse 91   Ht 5\' 5"  (1.651 m)   Wt 166 lb (75.3 kg)   BMI 27.62 kg/m   Physical Exam Vitals reviewed.  Constitutional:      Appearance: Normal appearance.  Cardiovascular:     Rate and Rhythm: Normal rate and regular rhythm.     Pulses: Normal pulses.     Heart sounds: Normal heart sounds.  Pulmonary:     Effort: Pulmonary effort is normal.     Breath sounds: Normal breath sounds.  Musculoskeletal:     Right lower leg: No edema.     Left lower leg: No edema.  Feet:     Comments: Diabetic foot exam was performed.  No deformities or other abnormal visual findings.  Posterior tibialis and dorsalis pulse intact bilaterally.  Intact to touch and monofilament testing bilaterally.   Skin:    General: Skin is warm and dry.  Neurological:     Mental Status: She is alert and oriented to person, place, and time.  Psychiatric:        Mood and Affect: Mood normal.        Behavior: Behavior normal.        Thought Content: Thought content normal.        Judgment: Judgment normal.             Assessment & Plan:   Problem List Items Addressed This Visit       Cardiovascular and Mediastinum   Coronary artery disease - Primary    Following with cardiology. No acute  concerns today. Continue current regimen.       Relevant Orders   Comprehensive metabolic panel   Lipid panel     Endocrine   Type 2 diabetes mellitus (HCC) (Chronic)    Well controlled with last A1c <8% per patient  Continue current medications - Trulicity 1.5 mg weekly  UTD on vaccines, eye exam, foot exam - requesting records On Statin  Discussed diet and exercise F/u in 4-6 months if labs are stable        Relevant Orders   Ambulatory referral to Ophthalmology   Hemoglobin A1c   CBC   Comprehensive metabolic panel   Lipid panel   TSH   Microalbumin / creatinine urine ratio     Other   Hyperlipidemia (Chronic)    -Medication management: continue lipitor  -Repeat CMP and lipid panel ordered -Diet low in saturated fat -Regular exercise - at least 30 minutes, 5 times per week       Relevant Orders   Comprehensive metabolic panel   Lipid panel   TSH   Other Visit Diagnoses     Screening for osteoporosis       Relevant Orders   DG Bone Density       Return in about 4 months (around 01/28/2022) for routine f/u 4-6 months if labs stable .   Clayborne Dana, NP

## 2021-09-27 NOTE — Progress Notes (Signed)
Occasional right leg/foot cramps at night

## 2021-09-27 NOTE — Patient Instructions (Signed)
Thank you for choosing [facility] for your Primary Care needs. I am excited for the opportunity to partner with you to meet your health care goals. It was a pleasure meeting you today!   Information on diet, exercise, and health maintenance recommendations are listed below. This is information to help you be sure you are on track for optimal health and monitoring.   Please look over this and let us know if you have any questions or if you have completed any of the health maintenance outside of [place] so that we can be sure your records are up to date.  ___________________________________________________________  MyChart:  For all urgent or time sensitive needs we ask that you please call the office to avoid delays. Our number is [phone]. MyChart is not constantly monitored and due to the large volume of messages a day, replies may take up to 72 business hours.  MyChart Policy: MyChart allows for you to see your visit notes, after visit summary, provider recommendations, lab and tests results, make an appointment, request refills, and contact your provider or the office for non-urgent questions or concerns. Providers are seeing patients during normal business hours and do not have built in time to review MyChart messages.  We ask that you allow a minimum of 3 business days for responses to MyChart messages. For this reason, please do not send urgent requests through MyChart. Please call the office at [phone]. New and ongoing conditions may require a visit. We have virtual and in-person visits available for your convenience.  Complex MyChart concerns may require a visit. Your provider may request you schedule a virtual or in-person visit to ensure we are providing the best care possible. MyChart messages sent after 11:00 AM on Friday will not be received by the provider until Monday morning.    Lab and Test Results: You will receive your lab and  test results on MyChart as soon as they are completed and results have been sent by the lab or testing facility. Due to this service, you will receive your results BEFORE your provider.  I review lab and test results each morning prior to seeing patients. Some results require collaboration with other providers to ensure you are receiving the most appropriate care. For this reason, we ask that you please allow a minimum of 3-5 business days from the time that ALL results have been received for your provider to receive and review lab and test results and contact you about these.  Most lab and test result comments from the provider will be sent through MyChart. Your provider may recommend changes to the plan of care, follow-up visits, repeat testing, ask questions, or request an office visit to discuss these results. You may reply directly to this message or call the office to provide information for the provider or set up an appointment. In some instances, you will be called with test results and recommendations. Please let us know if this is preferred and we will make note of this in your chart to provide this for you.    If you have not heard a response to your lab or test results in 5 business days from all results returning to MyChart, please call the office to let us know. We ask that you please avoid calling prior to this time unless there is an emergent concern. Due to high call volumes, this can delay the resulting process.  After Hours: For all non-emergency after hours needs,   please call the office at [phone] and select the option to reach the on-call  service. On-call services are shared between multiple [facility] offices and therefore it will not be possible to speak directly with your provider. On-call providers may provide medical advice and recommendations, but are unable to provide refills for maintenance medications.  For all emergency or urgent medical needs after normal business  hours, we recommend that you seek care at the closest Urgent Care or Emergency Department to ensure appropriate treatment in a timely manner.  [facility] has a 24 hour emergency room located on the ground floor for your convenience.   Urgent Concerns During the Business Day Providers are seeing patients from 8AM to 5PM with a busy schedule and are most often not able to respond to non-urgent calls until the end of the day or the next business day. If you should have URGENT concerns during the day, please call and speak to the nurse or schedule a same day appointment so that we can address your concern without delay.   Thank you, again, for choosing me as your health care partner. I appreciate your trust and look forward to learning more about you.   Kahner Yanik B. Vidalia Serpas, DNP, FNP-C  ___________________________________________________________  Health Maintenance Recommendations Screening Testing Mammogram Every 1-2 years based on history and risk factors Starting at age 50 Pap Smear Ages 21-39 every 3 years Ages 30-65 every 5 years with HPV testing More frequent testing may be required based on results and history Colon Cancer Screening Every 1-10 years based on test performed, risk factors, and history Starting at age 45 Bone Density Screening Every 2-10 years based on history Starting at age 65 for women Recommendations for men differ based on medication usage, history, and risk factors AAA Screening One time ultrasound Men 65-75 years old who have ever smoked Lung Cancer Screening Low Dose Lung CT every 12 months Age 50-80 years with a 20 pack-year smoking history who still smoke or who have quit within the last 15 years  Screening Labs Routine  Labs: Complete Blood Count (CBC), Complete Metabolic Panel (CMP), Cholesterol (Lipid Panel) Every 6-12 months based on history and medications May be recommended more frequently based on current conditions or  previous results Hemoglobin A1c Lab Every 3-12 months based on history and previous results Starting at age 45 or earlier with diagnosis of diabetes, high cholesterol, BMI >26, and/or risk factors Frequent monitoring for patients with diabetes to ensure blood sugar control Thyroid Panel (TSH w/ T3 & T4) Every 6 months based on history, symptoms, and risk factors May be repeated more often if on medication HIV One time testing for all patients 13 and older May be repeated more frequently for patients with increased risk factors or exposure Hepatitis C One time testing for all patients 18 and older May be repeated more frequently for patients with increased risk factors or exposure Gonorrhea, Chlamydia Every 12 months for all sexually active persons 13-24 years Additional monitoring may be recommended for those who are considered high risk or who have symptoms PSA Men 40-54 years old with risk factors Additional screening may be recommended from age 55-69 based on risk factors, symptoms, and history  Vaccine Recommendations Tetanus Booster All adults every 10 years Flu Vaccine All patients 6 months and older every year COVID Vaccine All patients 12 years and older Initial dosing with booster May recommend additional booster based on age and health history HPV Vaccine 2 doses all patients   age 9-26 Dosing may be considered for patients over 26 Shingles Vaccine (Shingrix) 2 doses all adults 50 years and older Pneumonia (Pneumovax 23) All adults 65 years and older May recommend earlier dosing based on health history Pneumonia (Prevnar 13) All adults 65 years and older Dosed 1 year after Pneumovax 23 Pneumonia (Prevnar 20) All adults 65 years and older (adults 19-64 with certain conditions or risk factors) 1 dose  For those who have no received Prevnar 13 vaccine previously   Additional Screening, Testing, and Vaccinations may be recommended on an individualized basis based on  family history, health history, risk factors, and/or exposure.  __________________________________________________________  Diet Recommendations for All Patients  I recommend that all patients maintain a diet low in saturated fats, carbohydrates, and cholesterol. While this can be challenging at first, it is not impossible and small changes can make big differences.  Things to try: Decreasing the amount of soda, sweet tea, and/or juice to one or less per day and replace with water While water is always the first choice, if you do not like water you may consider adding a water additive without sugar to improve the taste other sugar free drinks Replace potatoes with a brightly colored vegetable at dinner Use healthy oils, such as canola oil or olive oil, instead of butter or hard margarine Limit your bread intake to two pieces or less a day Replace regular pasta with low carb pasta options Bake, broil, or grill foods instead of frying Monitor portion sizes  Eat smaller, more frequent meals throughout the day instead of large meals  An important thing to remember is, if you love foods that are not great for your health, you don't have to give them up completely. Instead, allow these foods to be a reward when you have done well. Allowing yourself to still have special treats every once in a while is a nice way to tell yourself thank you for working hard to keep yourself healthy.   Also remember that every day is a new day. If you have a bad day and "fall off the wagon", you can still climb right back up and keep moving along on your journey!  We have resources available to help you!  Some websites that may be helpful include: www.MyPlate.gov  Www.VeryWellFit.com _____________________________________________________________  Activity Recommendations for All Patients  I recommend that all adults get at least 20 minutes of moderate physical activity that elevates your heart rate at least 5  days out of the week.  Some examples include: Walking or jogging at a pace that allows you to carry on a conversation Cycling (stationary bike or outdoors) Water aerobics Yoga Weight lifting Dancing If physical limitations prevent you from putting stress on your joints, exercise in a pool or seated in a chair are excellent options.  Do determine your MAXIMUM heart rate for activity: 220 - YOUR AGE = MAX Heart Rate   Remember! Do not push yourself too hard.  Start slowly and build up your pace, speed, weight, time in exercise, etc.  Allow your body to rest between exercise and get good sleep. You will need more water than normal when you are exerting yourself. Do not wait until you are thirsty to drink. Drink with a purpose of getting in at least 8, 8 ounce glasses of water a day plus more depending on how much you exercise and sweat.    If you begin to develop dizziness, chest pain, abdominal pain, jaw pain, shortness of breath, headache,   vision changes, lightheadedness, or other concerning symptoms, stop the activity and allow your body to rest. If your symptoms are severe, seek emergency evaluation immediately. If your symptoms are concerning, but not severe, please let us know so that we can recommend further evaluation.     

## 2021-09-27 NOTE — Assessment & Plan Note (Signed)
Well controlled with last A1c <8% per patient  Continue current medications - Trulicity 1.5 mg weekly  UTD on vaccines, eye exam, foot exam - requesting records On Statin  Discussed diet and exercise F/u in 4-6 months if labs are stable

## 2021-10-05 ENCOUNTER — Encounter: Payer: Self-pay | Admitting: *Deleted

## 2021-10-06 ENCOUNTER — Encounter: Payer: Self-pay | Admitting: Family Medicine

## 2021-10-08 ENCOUNTER — Other Ambulatory Visit (INDEPENDENT_AMBULATORY_CARE_PROVIDER_SITE_OTHER): Payer: PPO

## 2021-10-08 DIAGNOSIS — E785 Hyperlipidemia, unspecified: Secondary | ICD-10-CM | POA: Diagnosis not present

## 2021-10-08 DIAGNOSIS — I2510 Atherosclerotic heart disease of native coronary artery without angina pectoris: Secondary | ICD-10-CM | POA: Diagnosis not present

## 2021-10-08 DIAGNOSIS — E119 Type 2 diabetes mellitus without complications: Secondary | ICD-10-CM | POA: Diagnosis not present

## 2021-10-08 LAB — LIPID PANEL
Cholesterol: 111 mg/dL (ref 0–200)
HDL: 44.1 mg/dL (ref >39.00)
LDL Cholesterol: 28 mg/dL (ref 0–99)
NonHDL: 66.57
Total CHOL/HDL Ratio: 3
Triglycerides: 192 mg/dL — ABNORMAL HIGH (ref 0.0–149.0)
VLDL: 38.4 mg/dL (ref 0.0–40.0)

## 2021-10-08 LAB — CBC
HCT: 42.6 % (ref 36.0–46.0)
Hemoglobin: 14.3 g/dL (ref 12.0–15.0)
MCHC: 33.5 g/dL (ref 30.0–36.0)
MCV: 92.6 fl (ref 78.0–100.0)
Platelets: 192 10*3/uL (ref 150.0–400.0)
RBC: 4.6 Mil/uL (ref 3.87–5.11)
RDW: 14.3 % (ref 11.5–15.5)
WBC: 7.2 10*3/uL (ref 4.0–10.5)

## 2021-10-08 LAB — COMPREHENSIVE METABOLIC PANEL
ALT: 25 U/L (ref 0–35)
AST: 16 U/L (ref 0–37)
Albumin: 4.1 g/dL (ref 3.5–5.2)
Alkaline Phosphatase: 67 U/L (ref 39–117)
BUN: 12 mg/dL (ref 6–23)
CO2: 28 mEq/L (ref 19–32)
Calcium: 9.1 mg/dL (ref 8.4–10.5)
Chloride: 104 mEq/L (ref 96–112)
Creatinine, Ser: 0.67 mg/dL (ref 0.40–1.20)
GFR: 90.03 mL/min (ref >60.00)
Glucose, Bld: 180 mg/dL — ABNORMAL HIGH (ref 70–99)
Potassium: 4.8 mEq/L (ref 3.5–5.1)
Sodium: 140 mEq/L (ref 135–145)
Total Bilirubin: 0.8 mg/dL (ref 0.2–1.2)
Total Protein: 6.7 g/dL (ref 6.0–8.3)

## 2021-10-08 LAB — MICROALBUMIN / CREATININE URINE RATIO
Creatinine,U: 93.5 mg/dL
Microalb Creat Ratio: 1.2 mg/g (ref 0.0–30.0)
Microalb, Ur: 1.2 mg/dL (ref 0.0–1.9)

## 2021-10-08 LAB — HEMOGLOBIN A1C: Hgb A1c MFr Bld: 9.3 % — ABNORMAL HIGH (ref 4.6–6.5)

## 2021-10-08 LAB — TSH: TSH: 0.76 u[IU]/mL (ref 0.35–5.50)

## 2021-10-09 ENCOUNTER — Encounter: Payer: Self-pay | Admitting: Family Medicine

## 2021-10-09 NOTE — Progress Notes (Signed)
-   Overall cholesterol is good. Triglycerides are still high (this could be falsely high if you were not fasting). Start by adding Omega-3 supplement and lifestyle measures - we can recheck in 3-6 months.  - A1c jumped up quite a bit. Have you been taking your Trulicity and metformin? If so, then we need to increase the Trulicity to the next dose (3mg  weekly). Let me know.  - Other labs were stable.  Continue with lifestyle measures - heart healthy, low-carb diet and regular exercise.

## 2021-10-15 ENCOUNTER — Other Ambulatory Visit (HOSPITAL_BASED_OUTPATIENT_CLINIC_OR_DEPARTMENT_OTHER): Payer: Self-pay

## 2021-10-29 ENCOUNTER — Other Ambulatory Visit (HOSPITAL_BASED_OUTPATIENT_CLINIC_OR_DEPARTMENT_OTHER): Payer: Self-pay

## 2021-11-02 ENCOUNTER — Ambulatory Visit: Payer: PPO | Admitting: Cardiovascular Disease

## 2021-11-02 ENCOUNTER — Encounter: Payer: Self-pay | Admitting: Cardiovascular Disease

## 2021-11-02 DIAGNOSIS — E782 Mixed hyperlipidemia: Secondary | ICD-10-CM | POA: Diagnosis not present

## 2021-11-02 DIAGNOSIS — G478 Other sleep disorders: Secondary | ICD-10-CM

## 2021-11-02 DIAGNOSIS — I214 Non-ST elevation (NSTEMI) myocardial infarction: Secondary | ICD-10-CM

## 2021-11-02 DIAGNOSIS — E118 Type 2 diabetes mellitus with unspecified complications: Secondary | ICD-10-CM | POA: Diagnosis not present

## 2021-11-02 DIAGNOSIS — Z87891 Personal history of nicotine dependence: Secondary | ICD-10-CM | POA: Diagnosis not present

## 2021-11-02 DIAGNOSIS — E785 Hyperlipidemia, unspecified: Secondary | ICD-10-CM

## 2021-11-02 DIAGNOSIS — I10 Essential (primary) hypertension: Secondary | ICD-10-CM

## 2021-11-02 NOTE — Patient Instructions (Signed)
Medication Instructions:   NO CHANGES  *If you need a refill on your cardiac medications before your next appointment, please call your pharmacy*   Lab Work: NOT NEEDED    Testing/Procedures: NOT NEEDED   Follow-Up: At [facility], you and your health needs are our priority.  As part of our continuing mission to provide you with exceptional heart care, we have created designated Provider Care Teams.  These Care Teams include your primary Cardiologist (physician) and Advanced Practice Providers (APPs -  Physician Assistants and Nurse Practitioners) who all work together to provide you with the care you need, when you need it.     Your next appointment:   6 month(s)  The format for your next appointment:   In Person  Provider:   [name], MD

## 2021-11-02 NOTE — Progress Notes (Unsigned)
Cardiology Office Note    Date:  11/03/2021   ID:  Christina Larsen, DOB 12-01-1955, MRN [id number]  PCP:  Terrilyn Saver, NP  Cardiologist:  Shelva Majestic, MD   6 month  F/U office visit  History of Present Illness:  Christina Larsen is a 66 y.o. female who is originally from [place].  She has a history of diabetes mellitus, hypertension, hyperlipidemia, and tobacco use.  She had undergone initial rotator cuff surgery in June 2021 and underwent a redo surgery at the end of October.  While watching [facility], she had developed significant chest pain and presented to [facility] where ECG showed anteroseptal ST depression.  Troponin was elevated to 85 > 860.  She was given aspirin and started on heparin and nitroglycerin.  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  She required aspiration thrombectomy for significant clot burden and underwent successful stenting of her proximal circumflex vessel.  There was distal circumflex disease treated medically.  I had seen her during her hospitalization.  We had a lengthy discussion regarding absolute smoking cessation and since her presentation she has not had any further cigarettes.  She has been undergoing physical therapy for her redo shoulder surgery.  She was seen for initial post hospital follow-up on November 30 by Kathrynn Humble, NP.  I saw her for my initial office evaluation on March 14, 2020.  At that time she was feeling well and denied recurrent chest pain symptomatology and was unaware of palpitations.  She continued to be on aspirin and Brilinta for DAPT for minimum of 1 year, and is on metoprolol tartrate 25 mg twice a day.  She was tolerating  atorvastatin 80 mg daily and was on metformin and Trulicity for her diabetes mellitus.  She has peripheral neuropathy and is on gabapentin.    She presented to the emergency room on May 07, 2020 with complaints of intermittent chest tightness.  Her chest pain was intermittent and not exertionally precipitated.  Troponins were negative.  Chest x-ray did not reveal acute abnormalities.  The chest pain was a stabbing pain to the right shoulder and was not felt to be ischemic in etiology.  She was subsequently evaluated by [name] on May 09, 2020 and remained stable.  I last saw her on Jul 24, 2020.  On May 12 while walking her dog she had an episode of vertigo.  She was unaware of palpitations.  She denied any recurrent chest pain symptoms or exertional dyspnea.  She admits to poor sleep.  She also has experienced fatigue and daytime sleepiness.  An Epworth Sleepiness Scale score was therefore calculated in the office today and this endorsed at 16 consistent with excessive daytime sleepiness. She completed cardiac rehab.  She is now participating in [employer] sneaker program at the gym.  During her evaluation, I felt she was stable with reference to CAD.  I raise concern for possible sleep apnea which may be contributing to her significant excessive daytime sleepiness and with her poor sleep and cardiovascular comorbidities a sleep study was recommended.  She underwent a diagnostic polysomnogram in October 16, 2020.  Her AHI was 0 but she did have increased respiratory disturbance index of 12.5.  There was  no significant oxygen desaturation.  She had moderate snoring throughout the study.  He was felt most likely that she had increased upper airway resistance syndrome.  She was also noted to have periodic limb movement during sleep with associated arousals with an index of 10.5/h.  I saw on November 02, 2020.  At that time she was continuing to go to the  [employer] sne[employer] program.  She denied any recurrent  chest pain.  She continues to be on DAPT with aspirin/Brilinta she is on atorvastatin 80 mg for aggressive lipid-lowering therapy.  She is diabetic on metformin and Trulicity.  She has neuropathy on gabapentin.  Remotely, she did try medication in 2006 for restless legs.    I last saw her on April 19, 2021 at which time she remained stable and was without anginal symptomatology.  She denied any shortness of breath.  Fortunately she has stayed off tobacco since her presentation in November 2021.  At that time, she was wondering about longevity of continuing Brilinta due to cost and was subsequently transitioned to clopidogrel.  She continued to be on atorvastatin 80 mg for hyperlipidemia.  She was on metoprolol 25 mg twice a day.  At that time I suggested metoprolol to tartrate be discontinued and she was switched to metoprolol succinate 50 mg daily.  With her diabetes she was on metformin in addition to Trulicity.  She was seeing Dr. Reynaldo Minium for primary care who was checking laboratory.  Since I last saw her, she has switched her primary care provider to Caleen Jobs, NP at [facility] primary care.  Presently, she continues to be stable and denies any exertional chest pain.  At times she notes an occasional musculoskeletal twinge.  She goes to the gym at least 2-3 times per week where she rides a bike and walks on the treadmill.  She continues to work.  She was recently started on over-the-counter fish oil for elevated triglycerides and has been trying to improve her diet.  She presents for evaluation.  Past Medical History:  Diagnosis Date   Allergy    Coronary artery disease    11/9 NSTEMI, PCI to pLCx with aspiration thrombectomy/DESx1, occluded mRCA with left to right collaterals   Diabetes mellitus without complication ([place])    HLD (hyperlipidemia)    Hypertension    Myocardial infarction [facility])     Past Surgical History:  Procedure Laterality Date   [place]   CORONARY STENT INTERVENTION N/A 01/17/2020   Procedure: CORONARY STENT INTERVENTION;  Surgeon: Nelva Bush, MD;  Location: [facility];  Service: Cardiovascular;  Laterality: N/A;   LAMINECTOMY  2006   LEFT HEART CATH AND CORONARY ANGIOGRAPHY N/A 01/17/2020   Procedure: LEFT HEART CATH AND CORONARY ANGIOGRAPHY;  Surgeon: Nelva Bush, MD;  Location: [facility];  Service: Cardiovascular;  Laterality: N/A;   rotator cuff surgery      Current Medications: Outpatient Medications Prior to Visit  Medication Sig Dispense Refill   aspirin (ASPIRIN ADULT LOW STRENGTH) 81 MG EC tablet TAKE 1 TABLET (81 MG TOTAL) BY MOUTH DAILY. SWALLOW WHOLE. 30 tablet 10   atorvastatin (LIPITOR) 80 MG tablet TAKE 1 TABLET BY MOUTH ONCE DAILY 90 tablet 3   clopidogrel (PLAVIX) 75 MG tablet Take 1 tablet (75 mg total) by mouth daily. 90 tablet 3   Dulaglutide (TRULICITY) 1.5 KG/4.0NU SOPN Inject 1.5 mg into the skin once a week. 2 mL 5   gabapentin (  NEURONTIN) 300 MG capsule Take 1 capsule (300 mg total) by mouth every 12 (twelve) hours as needed. 30 capsule 0   metFORMIN (GLUCOPHAGE) 1000 MG tablet TAKE 1 TABLET BY MOUTH TWICE DAILY FOR 90 DAYS 180 tablet 3   methocarbamol (ROBAXIN) 500 MG tablet Take 1 tablet every 6-8 hours by oral route as needed for spasm. 40 tablet 0   metoprolol succinate (TOPROL XL) 50 MG 24 hr tablet Take 1 tablet (50 mg total) by mouth daily. 90 tablet 3   Multiple Vitamin (MULTIVITAMIN) tablet Take 1 tablet by mouth daily.     nitroGLYCERIN (NITROSTAT) 0.4 MG SL tablet PLACE 1 TABLET UNDER THE TONGUE EVERY 5 MINUTES AS NEEDED 25 tablet 2   No facility-administered medications prior to visit.     Allergies:   Hydrocodone-acetaminophen   Social History   Socioeconomic History   Marital status: Widowed    Spouse name: Not on file   Number of children: 1   Years of education: 15   Highest education level: Not on file  Occupational History   Occupation:  [employer]  Tobacco Use   Smoking status: Former    Types: Cigarettes    Quit date: 01/16/2020    Years since quitting: 1.8   Smokeless tobacco: Never  Vaping Use   Vaping Use: Never used  Substance and Sexual Activity   Alcohol use: Yes    Alcohol/week: 4.0 - 6.0 standard drinks of alcohol    Types: 3 - 4 Glasses of wine, 1 - 2 Shots of liquor per week   Drug use: Never   Sexual activity: Not Currently  Other Topics Concern   Not on file  Social History Narrative   Semi Retired   Investment banker, operational of Radio broadcast assistant Strain: Not on file  Food Insecurity: Not on file  Transportation Needs: Not on file  Physical Activity: Not on file  Stress: Not on file  Social Connections: Not on file    Socially she lives by herself.  She quit smoking the day of her heart attack.  She had been divorced from her husband who subsequently has died.  She has children.  Family History:  The patient's  family history includes Arthritis in her mother; Cancer in her brother; Colon cancer in her mother; Diabetes in her brother and mother; Heart disease in her father; Hypercholesterolemia in her father; Hyperlipidemia in her brother; Hypertension in her brother; Prostate cancer in her father.   ROS General: Negative; No fevers, chills, or night sweats;  HEENT: Negative; No changes in vision or hearing, sinus congestion, difficulty swallowing Pulmonary: Negative; No cough, wheezing, shortness of breath, hemoptysis Cardiovascular: See HPI GI: Negative; No nausea, vomiting, diarrhea, or abdominal pain GU: Negative; No dysuria, hematuria, or difficulty voiding Musculoskeletal: Negative; no myalgias, joint pain, or weakness Hematologic/Oncology: Negative; no easy bruising, bleeding Endocrine: Negative; no heat/cold intolerance; no diabetes Neuro: Negative; no changes in balance, headaches Skin: Negative; No rashes or skin lesions Psychiatric: Negative; No behavioral problems,  depression Sleep: Poor sleep, fatigue, daytime sleepiness, snoring; no bruxism, restless legs, hypnogognic hallucinations, no cataplexy Other comprehensive 14 point system review is negative.   PHYSICAL EXAM:   VS:  BP 128/88   Pulse 73   Ht _0  (1.651 m)   Wt 162 lb 12.8 oz (73.8 kg)   SpO2 95%   BMI 27.09 kg/m     Repeat blood pressure by me was 130/82  Wt Readings from Last 3 Encounters:  11/02/21 162 lb 12.8 oz (73.8 kg)  09/27/21 166 lb (75.3 kg)  04/19/21 162 lb 3.2 oz (73.6 kg)    General: Alert, oriented, no distress.  Skin: normal turgor, no rashes, warm and dry HEENT: Normocephalic, atraumatic. Pupils equal round and reactive to light; sclera anicteric; extraocular muscles intact;  Nose without nasal septal hypertrophy Mouth/Parynx benign; Mallinpatti scale 3 Neck: No JVD, no carotid bruits; normal carotid upstroke Lungs: clear to ausculatation and percussion; no wheezing or rales Chest wall: without tenderness to palpitation Heart: PMI not displaced, RRR, s1 s2 normal, 1/6 systolic murmur, no diastolic murmur, no rubs, gallops, thrills, or heaves Abdomen: soft, nontender; no hepatosplenomehaly, BS+; abdominal aorta nontender and not dilated by palpation. Back: no CVA tenderness Pulses 2+ Musculoskeletal: full range of motion, normal strength, no joint deformities Extremities: no clubbing cyanosis or edema, Homan's sign negative  Neurologic: grossly nonfocal; Cranial nerves grossly wnl Psychologic: Normal mood and affect   Studies/Labs Reviewed:   November 02, 2021 ECG (independently read by me): NSR at 73, LAHB  April 19, 2021 ECG (independently read by me):  NSR at 78, IRBBB, no ectopy  November 02, 2020 ECG (independently read by me): Normal sinus rhythm at 72 bpm, left axis deviation.  No ectopy.  No ST segment changes.  Jul 24, 2020 ECG (independently read by me): NSR at 72; Left axis deviation  January 2022 ECG (independently read by me): Normal sinus  rhythm at 68 bpm.  Left axis deviation.  Nondiagnostic T wave abnormality.  Recent Labs:    Latest Ref Rng & Units 10/08/2021    7:11 AM 04/20/2021    8:11 AM 11/01/2020   12:00 AM  BMP  Glucose 70 - 99 mg/dL 180  174    BUN 6 - 23 mg/dL _0 Creatinine 0.40 - 1.20 mg/dL 0.67  0.69  0.8      BUN/Creat Ratio 12 - 28  17    Sodium 135 - 145 mEq/L 140  141  141      Potassium 3.5 - 5.1 mEq/L 4.8  5.0  4.9      Chloride 96 - 112 mEq/L 104  102  107      CO2 19 - 32 mEq/L _1 Calcium 8.4 - 10.5 mg/dL 9.1  9.4  10.2         This result is from an external source.        Latest Ref Rng & Units 10/08/2021    7:11 AM 04/20/2021    8:11 AM 11/01/2020   12:00 AM  Hepatic Function  Total Protein 6.0 - 8.3 g/dL 6.7  6.9    Albumin 3.5 - 5.2 g/dL 4.1  4.5  4.3      AST 0 - 37 U/L _2 ALT 0 - 35 U/L _3 Alk Phosphatase 39 - 117 U/L 67  85  70      Total Bilirubin 0.2 - 1.2 mg/dL 0.8  0.6       This result is from an external source.       Latest Ref Rng & Units 10/08/2021    7:11 AM 11/01/2020   12:00 AM 05/07/2020   10:39 AM  CBC  WBC 4.0 - 10.5 K/uL 7.2  8.6     9.4  Hemoglobin 12.0 - 15.0 g/dL 14.3  15.8     15.0   Hematocrit 36.0 - 46.0 % 42.6  46     44.1   Platelets 150.0 - 400.0 K/uL 192.0  225     222      This result is from an external source.   Lab Results  Component Value Date   MCV 92.6 10/08/2021   MCV 94.0 05/07/2020   MCV 91 02/08/2020   Lab Results  Component Value Date   TSH 0.76 10/08/2021   Lab Results  Component Value Date   HGBA1C 9.3 (H) 10/08/2021     BNP No results found for: "BNP"  ProBNP No results found for: "PROBNP"   Lipid Panel     Component Value Date/Time   CHOL 111 10/08/2021 0711   CHOL 114 04/20/2021 0811   TRIG 192.0 (H) 10/08/2021 0711   HDL 44.10 10/08/2021 0711   HDL 47 04/20/2021 0811   CHOLHDL 3 10/08/2021 0711   VLDL 38.4 10/08/2021 0711   LDLCALC 28 10/08/2021 0711    LDLCALC 38 04/20/2021 0811   LABVLDL 29 04/20/2021 0811     RADIOLOGY: No results found.   Additional studies/ records that were reviewed today include:   01/17/2020 Conclusions: Significant two-vessel coronary artery disease with occlusions of the proximal LCx (acute with heavy thrombus burden) and mid RCA (chronic with left-to-right collaterals).  Moderate, non-obstructive coronary artery disease is also present in the ramus intermedius and distal LCx (codominant vessel). Mid LAD myocardial bridge. Mildly elevated left ventricular filling pressure with lateral wall hypokinesis and mildly reduced contraction. Successful PCI to proximal LCx with aspiration thrombectomy and placement of Resolute Onyx 3.0 x 18 mm drug-eluting stent (postdilated to 3.3 mm) with 0% residual stenosis and TIMI-3 flow.   Recommendations: Dual antiplatelet therapy with aspirin and ticagrelor for at least 12 months. Aggressive secondary prevention, including high-intensity statin therapy and smoking cessation. Medical management of chronic total occlusion of mid RCA as well as moderate ramus/distal LCx disease. Remove right femoral artery sheath with manual compression two hours after discontinuation of bivalirudin  Intervention      ECHO IMPRESSIONS   1. Left ventricular ejection fraction, by estimation, is 55%. The left  ventricle has normal function. The left ventricle has no regional wall  motion abnormalities. Left ventricular diastolic parameters were normal.   2. Right ventricular systolic function is normal. The right ventricular  size is normal.   3. The mitral valve is normal in structure. No evidence of mitral valve  regurgitation. No evidence of mitral stenosis.   4. The aortic valve is normal in structure. Aortic valve regurgitation is  not visualized. Mild aortic valve sclerosis is present, with no evidence  of aortic valve stenosis.   5. Aortic dilatation noted. There is mild  dilatation of the ascending  aorta, measuring 40 mm.   6. The inferior vena cava is normal in size with greater than 50%  respiratory variability, suggesting right atrial pressure of 3 mmHg.    ASSESSMENT:    1. Non-ST elevation (NSTEMI) myocardial infarction ([place])   2. Essential hypertension   3. Mixed hyperlipidemia   4. Hyperlipidemia with target LDL less than 70   5. Type 2 diabetes mellitus with complication, without long-term current use of insulin ([place])   6. UARS (upper airway resistance syndrome)   7. History of tobacco use: quit November 2021     PLAN:  [name] is a very pleasant 66 year old female  who is originally from [place].  She has a history of previous significant tobacco use, hypertension, diabetes mellitus, as well as hyperlipidemia.  She developed an acute coronary syndrome 1 week following her redo right shoulder surgery and was found to have thrombotic occlusion of her proximal circumflex in November 2021.  Catheterization also revealed a chronic total occlusion of RCA which was well collateralized via septal perforators arising from the LAD.  She also had mild concomitant CAD in her LAD, moderate in the ramus intermediate, and in her proximal RCA.  She has not had any recurrent anginal symptomatology and underwent successful DES stenting after thrombectomy of her proximal circumflex.  Since her initial post hospital office visit with me, she apparently presented to the emergency room in February 2022 with atypical chest pain that was described as a stabbing sharp discomfort radiating to her right shoulder.  This most likely was musculoskeletal in etiology.  She has not had recurrent symptoms.  She continues to be on DAPT following her STEMI and is tolerating this well.  She was treated with aspirin/Brilinta which she tolerated well but at her last office visit in February 2023 due to cost issues she was transition to generic clopidogrel.  She has continued to be  stable and has not had any recurrent anginal symptomatology.  Her blood pressure today is stable and she is tolerating metoprolol succinate 50 mg daily.  She continues to be on high potency statin therapy with atorvastatin 80 mg.  Most recent laboratory from October 08, 2021 was reviewed which showed total cholesterol 111, HDL 44, LDL 28.  However triglycerides remain elevated at 192.  She recently started over-the-counter omega-3 fatty acid.  We discussed improved diet particularly carbohydrates and sweets.  She is diabetic on metformin in addition to Trulicity.  She has not had any anginal symptomatology.  At times she notes musculoskeletal chest wall twinges which lasts seconds and is nonexertional.  She will be following up with her new primary care provider.  Recent laboratory had shown an elevated hemoglobin A1c at 9.3.  Repeat laboratory will be checked by her primary provider and she may require more aggressive diabetic management and if triglycerides remain elevated may benefit from additional treatment.  I will see her in 6 months for reevaluation.   Medication Adjustments/Labs and Tests Ordered: Current medicines are reviewed at length with the patient today.  Concerns regarding medicines are outlined above.  Medication changes, Labs and Tests ordered today are listed in the Patient Instructions below. Patient Instructions  Medication Instructions:   NO CHANGES  *If you need a refill on your cardiac medications before your next appointment, please call your pharmacy*   Lab Work: NOT NEEDED    Testing/Procedures: NOT NEEDED   Follow-Up: At [facility], you and your health needs are our priority.  As part of our continuing mission to provide you with exceptional heart care, we have created designated Provider Care Teams.  These Care Teams include your primary Cardiologist (physician) and Advanced Practice Providers (APPs -  Physician Assistants and Nurse Practitioners) who all work  together to provide you with the care you need, when you need it.     Your next appointment:   6 month(s)  The format for your next appointment:   In Person  Provider:   Shelva Majestic, MD       Signed, Shelva Majestic, MD  11/03/2021 2:35 PM    [place] [address], [place], [place]  [address] Phone: [phone]

## 2021-11-03 ENCOUNTER — Encounter: Payer: Self-pay | Admitting: Cardiovascular Disease

## 2021-11-21 ENCOUNTER — Ambulatory Visit (INDEPENDENT_AMBULATORY_CARE_PROVIDER_SITE_OTHER): Payer: PPO | Admitting: *Deleted

## 2021-11-21 ENCOUNTER — Telehealth: Payer: Self-pay | Admitting: Family Medicine

## 2021-11-21 VITALS — BP 124/76 | HR 91 | Ht 65.0 in | Wt 163.2 lb

## 2021-11-21 DIAGNOSIS — Z0000 Encounter for general adult medical examination without abnormal findings: Secondary | ICD-10-CM | POA: Diagnosis not present

## 2021-11-21 NOTE — Patient Instructions (Signed)
[name] , Thank you for taking time to come for your Medicare Wellness Visit. I appreciate your ongoing commitment to your health goals. Please review the following plan we discussed and let me know if I can assist you in the future.   These are the goals we discussed:  Goals   None     This is a list of the screening recommended for you and due dates:  Health Maintenance  Topic Date Due   Hepatitis C Screening: USPSTF Recommendation to screen - Ages 61-79 yo.  Never done   Tetanus Vaccine  Never done   Zoster (Shingles) Vaccine (1 of 2) Never done   Pneumonia Vaccine (1 - PCV) Never done   DEXA scan (bone density measurement)  Never done   COVID-19 Vaccine (2 - Pfizer risk series) 12/28/2019   Flu Shot  10/09/2021   Eye exam for diabetics  03/19/2022   Hemoglobin A1C  04/10/2022   Complete foot exam   09/28/2022   Urine Protein Check  10/09/2022   Mammogram  12/28/2022   Colon Cancer Screening  10/05/2026   HPV Vaccine  Aged Out       Next appointment: Follow up in one year for your annual wellness visit    Preventive Care 65 Years and Older, Female Preventive care refers to lifestyle choices and visits with your health care provider that can promote health and wellness. What does preventive care include? A yearly physical exam. This is also called an annual well check. Dental exams once or twice a year. Routine eye exams. Ask your health care provider how often you should have your eyes checked. Personal lifestyle choices, including: Daily care of your teeth and gums. Regular physical activity. Eating a healthy diet. Avoiding tobacco and drug use. Limiting alcohol use. Practicing safe sex. Taking low-dose aspirin every day. Taking vitamin and mineral supplements as recommended by your health care provider. What happens during an annual well check? The services and screenings done by your health care provider during your annual well check will depend on your age,  overall health, lifestyle risk factors, and family history of disease. Counseling  Your health care provider may ask you questions about your: Alcohol use. Tobacco use. Drug use. Emotional well-being. Home and relationship well-being. Sexual activity. Eating habits. History of falls. Memory and ability to understand (cognition). Work and work Astronomer. Reproductive health. Screening  You may have the following tests or measurements: Height, weight, and BMI. Blood pressure. Lipid and cholesterol levels. These may be checked every 5 years, or more frequently if you are over 55 years old. Skin check. Lung cancer screening. You may have this screening every year starting at age 68 if you have a 30-pack-year history of smoking and currently smoke or have quit within the past 15 years. Fecal occult blood test (FOBT) of the stool. You may have this test every year starting at age 68. Flexible sigmoidoscopy or colonoscopy. You may have a sigmoidoscopy every 5 years or a colonoscopy every 10 years starting at age 68. Hepatitis C blood test. Hepatitis B blood test. Sexually transmitted disease (STD) testing. Diabetes screening. This is done by checking your blood sugar (glucose) after you have not eaten for a while (fasting). You may have this done every 1-3 years. Bone density scan. This is done to screen for osteoporosis. You may have this done starting at age 49. Mammogram. This may be done every 1-2 years. Talk to your health care provider about how often  you should have regular mammograms. Talk with your health care provider about your test results, treatment options, and if necessary, the need for more tests. Vaccines  Your health care provider may recommend certain vaccines, such as: Influenza vaccine. This is recommended every year. Tetanus, diphtheria, and acellular pertussis (Tdap, Td) vaccine. You may need a Td booster every 10 years. Zoster vaccine. You may need this after age  68. Pneumococcal 13-valent conjugate (PCV13) vaccine. One dose is recommended after age 68. Pneumococcal polysaccharide (PPSV23) vaccine. One dose is recommended after age 68. Talk to your health care provider about which screenings and vaccines you need and how often you need them. This information is not intended to replace advice given to you by your health care provider. Make sure you discuss any questions you have with your health care provider. Document Released: 03/24/2015 Document Revised: 11/15/2015 Document Reviewed: 12/27/2014 Elsevier Interactive Patient Education  2017 [employer] Prevention in the Home Falls can cause injuries. They can happen to people of all ages. There are many things you can do to make your home safe and to help prevent falls. What can I do on the outside of my home? Regularly fix the edges of walkways and driveways and fix any cracks. Remove anything that might make you trip as you walk through a door, such as a raised step or threshold. Trim any bushes or trees on the path to your home. Use bright outdoor lighting. Clear any walking paths of anything that might make someone trip, such as rocks or tools. Regularly check to see if handrails are loose or broken. Make sure that both sides of any steps have handrails. Any raised decks and porches should have guardrails on the edges. Have any leaves, snow, or ice cleared regularly. Use sand or salt on walking paths during winter. Clean up any spills in your garage right away. This includes oil or grease spills. What can I do in the bathroom? Use night lights. Install grab bars by the toilet and in the tub and shower. Do not use towel bars as grab bars. Use non-skid mats or decals in the tub or shower. If you need to sit down in the shower, use a plastic, non-slip stool. Keep the floor dry. Clean up any water that spills on the floor as soon as it happens. Remove soap buildup in the tub or shower  regularly. Attach bath mats securely with double-sided non-slip rug tape. Do not have throw rugs and other things on the floor that can make you trip. What can I do in the bedroom? Use night lights. Make sure that you have a light by your bed that is easy to reach. Do not use any sheets or blankets that are too big for your bed. They should not hang down onto the floor. Have a firm chair that has side arms. You can use this for support while you get dressed. Do not have throw rugs and other things on the floor that can make you trip. What can I do in the kitchen? Clean up any spills right away. Avoid walking on wet floors. Keep items that you use a lot in easy-to-reach places. If you need to reach something above you, use a strong step stool that has a grab bar. Keep electrical cords out of the way. Do not use floor polish or wax that makes floors slippery. If you must use wax, use non-skid floor wax. Do not have throw rugs and other things on  the floor that can make you trip. What can I do with my stairs? Do not leave any items on the stairs. Make sure that there are handrails on both sides of the stairs and use them. Fix handrails that are broken or loose. Make sure that handrails are as long as the stairways. Check any carpeting to make sure that it is firmly attached to the stairs. Fix any carpet that is loose or worn. Avoid having throw rugs at the top or bottom of the stairs. If you do have throw rugs, attach them to the floor with carpet tape. Make sure that you have a light switch at the top of the stairs and the bottom of the stairs. If you do not have them, ask someone to add them for you. What else can I do to help prevent falls? Wear shoes that: Do not have high heels. Have rubber bottoms. Are comfortable and fit you well. Are closed at the toe. Do not wear sandals. If you use a stepladder: Make sure that it is fully opened. Do not climb a closed stepladder. Make sure that  both sides of the stepladder are locked into place. Ask someone to hold it for you, if possible. Clearly mark and make sure that you can see: Any grab bars or handrails. First and last steps. Where the edge of each step is. Use tools that help you move around (mobility aids) if they are needed. These include: Canes. Walkers. Scooters. Crutches. Turn on the lights when you go into a dark area. Replace any light bulbs as soon as they burn out. Set up your furniture so you have a clear path. Avoid moving your furniture around. If any of your floors are uneven, fix them. If there are any pets around you, be aware of where they are. Review your medicines with your doctor. Some medicines can make you feel dizzy. This can increase your chance of falling. Ask your doctor what other things that you can do to help prevent falls. This information is not intended to replace advice given to you by your health care provider. Make sure you discuss any questions you have with your health care provider. Document Released: 12/22/2008 Document Revised: 08/03/2015 Document Reviewed: 04/01/2014 Elsevier Interactive Patient Education  2017 [employer].

## 2021-11-21 NOTE — Progress Notes (Addendum)
Subjective:   Christina Larsen is a 66 y.o. female who presents for Medicare Annual (Subsequent) preventive examination.  Review of Systems    Defer to PCP Cardiac Risk Factors include: advanced age (>105men, >6 women);diabetes mellitus;dyslipidemia     Objective:    Today's Vitals   11/21/21 0850  BP: 124/76  Pulse: 91  Weight: 163 lb 3.2 oz (74 kg)  Height: 5\' 5"  (1.651 m)   Body mass index is 27.16 kg/m.     11/21/2021    8:55 AM 09/27/2021    1:31 PM 10/16/2020    8:13 PM 05/07/2020   10:13 AM 01/18/2020   10:58 AM 01/16/2020   11:06 PM  Advanced Directives  Does Patient Have a Medical Advance Directive? Yes No No Yes Yes Yes  Type of 13/09/2019 of [place];Living will    Living will   Does patient want to make changes to medical advance directive? No - Patient declined    No - Patient declined   Copy of Healthcare Power of Attorney in Chart? No - copy requested       Would patient like information on creating a medical advance directive? No - Patient declined No - Patient declined No - Patient declined       Current Medications (verified) Outpatient Encounter Medications as of 11/21/2021  Medication Sig   aspirin (ASPIRIN ADULT LOW STRENGTH) 81 MG EC tablet TAKE 1 TABLET (81 MG TOTAL) BY MOUTH DAILY. SWALLOW WHOLE.   atorvastatin (LIPITOR) 80 MG tablet TAKE 1 TABLET BY MOUTH ONCE DAILY   clopidogrel (PLAVIX) 75 MG tablet Take 1 tablet (75 mg total) by mouth daily.   Dulaglutide (TRULICITY) 1.5 MG/0.5ML SOPN Inject 1.5 mg into the skin once a week.   gabapentin (NEURONTIN) 300 MG capsule Take 1 capsule (300 mg total) by mouth every 12 (twelve) hours as needed.   metFORMIN (GLUCOPHAGE) 1000 MG tablet TAKE 1 TABLET BY MOUTH TWICE DAILY FOR 90 DAYS   methocarbamol (ROBAXIN) 500 MG tablet Take 1 tablet every 6-8 hours by oral route as needed for spasm.   metoprolol succinate (TOPROL XL) 50 MG 24 hr tablet Take 1 tablet (50 mg total) by mouth daily.    Multiple Vitamin (MULTIVITAMIN) tablet Take 1 tablet by mouth daily.   nitroGLYCERIN (NITROSTAT) 0.4 MG SL tablet PLACE 1 TABLET UNDER THE TONGUE EVERY 5 MINUTES AS NEEDED   No facility-administered encounter medications on file as of 11/21/2021.    Allergies (verified) Hydrocodone-acetaminophen   History: Past Medical History:  Diagnosis Date   Allergy    Coronary artery disease    11/9 NSTEMI, PCI to pLCx with aspiration thrombectomy/DESx1, occluded mRCA with left to right collaterals   Diabetes mellitus without complication (HCC)    HLD (hyperlipidemia)    Hypertension    Myocardial infarction [facility])    Past Surgical History:  Procedure Laterality Date   CARDIAC CATHETERIZATION     CHOLECYSTECTOMY  1992   CORONARY STENT INTERVENTION N/A 01/17/2020   Procedure: CORONARY STENT INTERVENTION;  Surgeon: 13/10/2019, MD;  Location: [facility] INVASIVE CV LAB;  Service: Cardiovascular;  Laterality: N/A;   LAMINECTOMY  2006   LEFT HEART CATH AND CORONARY ANGIOGRAPHY N/A 01/17/2020   Procedure: LEFT HEART CATH AND CORONARY ANGIOGRAPHY;  Surgeon: 13/10/2019, MD;  Location: [facility] INVASIVE CV LAB;  Service: Cardiovascular;  Laterality: N/A;   rotator cuff surgery     Family History  Problem Relation Age of Onset   Arthritis Mother  Diabetes Mother    Colon cancer Mother    Heart disease Father    Prostate cancer Father    Hypercholesterolemia Father    Cancer Brother    Diabetes Brother    Hyperlipidemia Brother    Hypertension Brother    Social History   Socioeconomic History   Marital status: Widowed    Spouse name: Not on file   Number of children: 1   Years of education: 15   Highest education level: Not on file  Occupational History   Occupation: [employer]  Tobacco Use   Smoking status: Former    Types: Cigarettes    Quit date: 01/16/2020    Years since quitting: 1.8   Smokeless tobacco: Never  Vaping Use   Vaping Use: Never used  Substance and Sexual  Activity   Alcohol use: Yes    Alcohol/week: 4.0 - 6.0 standard drinks of alcohol    Types: 3 - 4 Glasses of wine, 1 - 2 Shots of liquor per week   Drug use: Never   Sexual activity: Not Currently  Other Topics Concern   Not on file  Social History Narrative   Semi Retired   Investment banker, operational of Health   Financial Resource Strain: Medium Risk (11/21/2021)   Overall Financial Resource Strain (CARDIA)    Difficulty of Paying Living Expenses: Somewhat hard  Food Insecurity: Food Insecurity Present (11/21/2021)   Hunger Vital Sign    Worried About Running Out of Food in the Last Year: Sometimes true    Ran Out of Food in the Last Year: Never true  Transportation Needs: No Transportation Needs (11/21/2021)   PRAPARE - Hydrologist (Medical): No    Lack of Transportation (Non-Medical): No  Physical Activity: Inactive (11/21/2021)   Exercise Vital Sign    Days of Exercise per Week: 0 days    Minutes of Exercise per Session: 0 min  Stress: No Stress Concern Present (11/21/2021)   [place]    Feeling of Stress : Not at all  Social Connections: Moderately Isolated (11/21/2021)   Social Connection and Isolation Panel [NHANES]    Frequency of Communication with Friends and Family: Three times a week    Frequency of Social Gatherings with Friends and Family: Once a week    Attends Religious Services: More than 4 times per year    Active Member of [employer] or Organizations: No    Attends Archivist Meetings: Never    Marital Status: Widowed    Tobacco Counseling Counseling given: Not Answered   Clinical Intake:  Pre-visit preparation completed: Yes  Pain : No/denies pain     Diabetes: Yes CBG done?: No Did pt. bring in CBG monitor from home?: No  How often do you need to have someone help you when you read instructions, pamphlets, or other written materials from your doctor or  pharmacy?: 1 - Never  Diabetic? Yes Nutrition Risk Assessment:  Has the patient had any N/V/D within the last 2 months?  No  Does the patient have any non-healing wounds?  No  Has the patient had any unintentional weight loss or weight gain?  No   Diabetes:  Is the patient diabetic?  Yes  If diabetic, was a CBG obtained today?  No  Did the patient bring in their glucometer from home?  No  How often do you monitor your CBG's? Every other morning.   Financial Strains  and Diabetes Management:  Are you having any financial strains with the device, your supplies or your medication? No .  Does the patient want to be seen by Chronic Care Management for management of their diabetes?  No  Would the patient like to be referred to a Nutritionist or for Diabetic Management?  No   Diabetic Exams:  Diabetic Eye Exam: Completed 03/19/21 Diabetic Foot Exam: Completed 09/27/21    Interpreter Needed?: No  Information entered by :: Beatris Ship, [place]   Activities of Daily Living    11/21/2021    9:05 AM  In your present state of health, do you have any difficulty performing the following activities:  Hearing? 0  Vision? 0  Difficulty concentrating or making decisions? 0  Walking or climbing stairs? 0  Dressing or bathing? 0  Doing errands, shopping? 0  Preparing Food and eating ? N  Using the Toilet? N  In the past six months, have you accidently leaked urine? N  Do you have problems with loss of bowel control? N  Managing your Medications? N  Managing your Finances? N  Housekeeping or managing your Housekeeping? N    Patient Care Team: Terrilyn Saver, NP as PCP - General (Family Medicine) Troy Sine, MD as PCP - Cardiology (Cardiology) Melissa Noon, [place] as Referring Physician (Optometry)  Indicate any recent Medical Services you may have received from other than [facility] providers in the past year (date may be approximate).     Assessment:   This is a routine wellness  examination for [place].  Hearing/Vision screen No results found.  Dietary issues and exercise activities discussed: Current Exercise Habits: Home exercise routine, Type of exercise: treadmill;Other - see comments (stationary bike), Time (Minutes): 40, Frequency (Times/Week): 3, Weekly Exercise (Minutes/Week): 120, Intensity: Mild, Exercise limited by: None identified   Goals Addressed   None    Depression Screen    11/21/2021    9:11 AM 07/12/2020    9:12 AM 05/11/2020   12:08 PM  PHQ 2/9 Scores  PHQ - 2 Score 0 0 1    Fall Risk    11/21/2021    8:54 AM 05/11/2020   10:19 AM  Fall Risk   Falls in the past year? 1 0  Number falls in past yr: 0 0  Injury with Fall? 0 0  Risk for fall due to : No Fall Risks Impaired mobility  Risk for fall due to: Comment  Right arm, S/P shoulder surgery  Follow up Falls evaluation completed Falls evaluation completed    [place]:  Any stairs in or around the home? No  If so, are there any without handrails?  No stairs Home free of loose throw rugs in walkways, pet beds, electrical cords, etc? Yes  Adequate lighting in your home to reduce risk of falls? Yes   ASSISTIVE DEVICES UTILIZED TO PREVENT FALLS:  Life alert? No  Use of a cane, walker or w/c? No  Grab bars in the bathroom? No  Shower chair or bench in shower? No  Elevated toilet seat or a handicapped toilet? No   TIMED UP AND GO:  Was the test performed? Yes .  Length of time to ambulate 10 feet: 5 sec.   Gait steady and fast without use of assistive device  Cognitive Function:        11/21/2021    9:18 AM  6CIT Screen  What Year? 0 points  What month? 0 points  What time? 0 points  Count back from 20 0 points  Months in reverse 0 points  Repeat phrase 0 points  Total Score 0 points    Immunizations Immunization History  Administered Date(s) Administered   Fluad Quad(high Dose 65+) 01/19/2020   PFIZER(Purple Top)SARS-COV-2  Vaccination 12/07/2019    TDAP status: Due, Education has been provided regarding the importance of this vaccine. Advised may receive this vaccine at local pharmacy or Health Dept. Aware to provide a copy of the vaccination record if obtained from local pharmacy or Health Dept. Verbalized acceptance and understanding.  Flu Vaccine status: Due, Education has been provided regarding the importance of this vaccine. Advised may receive this vaccine at local pharmacy or Health Dept. Aware to provide a copy of the vaccination record if obtained from local pharmacy or Health Dept. Verbalized acceptance and understanding.  Pneumococcal vaccine status: Due, Education has been provided regarding the importance of this vaccine. Advised may receive this vaccine at local pharmacy or Health Dept. Aware to provide a copy of the vaccination record if obtained from local pharmacy or Health Dept. Verbalized acceptance and understanding.  Covid-19 vaccine status: Information provided on how to obtain vaccines.   Qualifies for Shingles Vaccine? Yes   Zostavax completed No   Shingrix Completed?: No.    Education has been provided regarding the importance of this vaccine. Patient has been advised to call insurance company to determine out of pocket expense if they have not yet received this vaccine. Advised may also receive vaccine at local pharmacy or Health Dept. Verbalized acceptance and understanding.  Screening Tests Health Maintenance  Topic Date Due   Hepatitis C Screening  Never done   TETANUS/TDAP  Never done   Zoster Vaccines- Shingrix (1 of 2) Never done   Pneumonia Vaccine 68+ Years old (1 - PCV) Never done   DEXA SCAN  Never done   COVID-19 Vaccine (2 - Pfizer risk series) 12/28/2019   INFLUENZA VACCINE  10/09/2021   OPHTHALMOLOGY EXAM  03/19/2022   HEMOGLOBIN A1C  04/10/2022   FOOT EXAM  09/28/2022   URINE MICROALBUMIN  10/09/2022   MAMMOGRAM  12/28/2022   COLONOSCOPY (Pts 45-35yrs Insurance  coverage will need to be confirmed)  10/05/2026   HPV VACCINES  Aged Out    Health Maintenance  Health Maintenance Due  Topic Date Due   Hepatitis C Screening  Never done   TETANUS/TDAP  Never done   Zoster Vaccines- Shingrix (1 of 2) Never done   Pneumonia Vaccine 24+ Years old (1 - PCV) Never done   DEXA SCAN  Never done   COVID-19 Vaccine (2 - Pfizer risk series) 12/28/2019   INFLUENZA VACCINE  10/09/2021    Colorectal cancer screening: Type of screening: Colonoscopy. Completed 10/04/16. Repeat every 10 years  Mammogram status: Completed 12/27/20. Repeat every year  Bone Density status: Ordered 09/27/21. Pt provided with contact info and advised to call to schedule appt.  Lung Cancer Screening: (Low Dose CT Chest recommended if Age 68-80 years, 30 pack-year currently smoking OR have quit w/in 15years.) does not qualify.   Lung Cancer Screening Referral: N/a  Additional Screening:  Hepatitis C Screening: does qualify; Completed N/a  Vision Screening: Recommended annual ophthalmology exams for early detection of glaucoma and other disorders of the eye. Is the patient up to date with their annual eye exam?  Yes  Who is the provider or what is the name of the office in which the patient attends annual eye exams? [facility]  If pt is not established with a provider, would they like to be referred to a provider to establish care? No .   Dental Screening: Recommended annual dental exams for proper oral hygiene  Community Resource Referral / Chronic Care Management: CRR required this visit?  No   CCM required this visit?  No      Plan:     I have personally reviewed and noted the following in the patient's chart:   Medical and social history Use of alcohol, tobacco or illicit drugs  Current medications and supplements including opioid prescriptions. Patient is not currently taking opioid prescriptions. Functional ability and status Nutritional  status Physical activity Advanced directives List of other physicians Hospitalizations, surgeries, and ER visits in previous 12 months Vitals Screenings to include cognitive, depression, and falls Referrals and appointments  In addition, I have reviewed and discussed with patient certain preventive protocols, quality metrics, and best practice recommendations. A written personalized care plan for preventive services as well as general preventive health recommendations were provided to patient.     Donne Anon, CMA   11/21/2021   Nurse Notes: None   I have reviewed and agree with [facility] documentation.  Willow Ora, MD

## 2021-11-21 NOTE — Telephone Encounter (Signed)
It was discussed during my visit with her this morning for her AWV that her labs would be done at the time of her visit with Ladona Ridgel and she confirmed that she would be fasting that morning when she comes in.

## 2021-11-21 NOTE — Telephone Encounter (Signed)
Patient states that she is suppose to follow up with Christina Larsen in Nov. Beck told her she would need to come in and do some more blood work before the appt.  Can we insert this & we can get her scheduled?  Please advise

## 2021-12-13 DIAGNOSIS — L820 Inflamed seborrheic keratosis: Secondary | ICD-10-CM | POA: Diagnosis not present

## 2021-12-13 DIAGNOSIS — D485 Neoplasm of uncertain behavior of skin: Secondary | ICD-10-CM | POA: Diagnosis not present

## 2021-12-13 DIAGNOSIS — B078 Other viral warts: Secondary | ICD-10-CM | POA: Diagnosis not present

## 2022-01-08 ENCOUNTER — Ambulatory Visit
Admission: EM | Admit: 2022-01-08 | Discharge: 2022-01-08 | Disposition: A | Discharge status: Home / Self Care | Payer: PPO | Attending: Emergency Medicine | Admitting: Emergency Medicine

## 2022-01-08 ENCOUNTER — Telehealth: Payer: Self-pay | Admitting: Family Medicine

## 2022-01-08 ENCOUNTER — Other Ambulatory Visit (HOSPITAL_BASED_OUTPATIENT_CLINIC_OR_DEPARTMENT_OTHER): Payer: Self-pay

## 2022-01-08 DIAGNOSIS — B0223 Postherpetic polyneuropathy: Secondary | ICD-10-CM | POA: Insufficient documentation

## 2022-01-08 DIAGNOSIS — B029 Zoster without complications: Secondary | ICD-10-CM | POA: Diagnosis not present

## 2022-01-08 MED ORDER — GABAPENTIN 300 MG PO CAPS
300.0000 mg | ORAL_CAPSULE | Freq: Four times a day (QID) | ORAL | 0 refills | Status: DC | PRN
Start: 2022-01-08 — End: 2022-05-02
  Filled 2022-01-08: qty 120, 15d supply, fill #0

## 2022-01-08 MED ORDER — VALACYCLOVIR HCL 1 G PO TABS
1000.0000 mg | ORAL_TABLET | Freq: Three times a day (TID) | ORAL | 0 refills | Status: AC
  Filled 2022-01-08: qty 21, 7d supply, fill #0

## 2022-01-08 NOTE — Telephone Encounter (Signed)
Pt seen at [facility].  

## 2022-01-08 NOTE — Telephone Encounter (Signed)
Pt called stating that she was experiencing the following symptoms:  -Tenderness on the right side of her head accompanied with "feeling hot" on that same side  -Dizziness to the point of seeing stars  Pt was transferred to triage nurse for further eval.

## 2022-01-08 NOTE — Telephone Encounter (Signed)
Spoke with triage nurse kim and she stated that pt needed to be seen within 4 hours.  Advised that we did not have any openings and she needs to head to urgent care.  She will advise patient to go to urgent care.

## 2022-01-08 NOTE — Telephone Encounter (Signed)
Nurse Assessment Nurse: Toribio Harbour, RN, Joelene Millin Date/Time (Eastern Time): 01/08/2022 11:36:59 AM Confirm and document reason for call. If symptomatic, describe symptoms. ---Caller states for the last week she said the right rear part of her head has been tender and when she touches her hair it hurts and her scalp hurts and she said its hot to the touch. She said she is at work and she said she was looking at a screen at work and she says she got dizzy. She is not dizzy now. No fever. Symptoms started 1 week ago and has gotten worse the past 3 days. Does the patient have any new or worsening symptoms? ---Yes Will a triage be completed? ---Yes Related visit to physician within the last 2 weeks? ---No Does the PT have any chronic conditions? (i.e. diabetes, asthma, this includes High risk factors for pregnancy, etc.) ---Yes List chronic conditions. ---diabetes, heart attack Is this a behavioral health or substance abuse call? ---No Guidelines Guideline Title Affirmed Question Affirmed Notes Nurse Date/Time (Eastern Time) Rash or Redness - Localized [1] Looks infected (spreading redness, pus) AND [2] large Daves, RN, Joelene Millin 01/08/2022 11:39:52 AM PLEASE NOTE: All timestamps contained within this report are represented as [place] Standard Time. CONFIDENTIALTY NOTICE: This fax transmission is intended only for the addressee. It contains information that is legally privileged, confidential or otherwise protected from use or disclosure. If you are not the intended recipient, you are strictly prohibited from reviewing, disclosing, copying using or disseminating any of this information or taking any action in reliance on or regarding this information. If you have received this fax in error, please notify us immediately by telephone so that we can arrange for its return to [place]. Phone: [phone], Toll-Free: [phone], Fax: [phone] Page: 2 of 2 Call Id:  [id number] Guidelines Guideline Title Affirmed Question Affirmed Notes Nurse Date/Time Eilene Ghazi Time) red area (> 2 in. or 5 cm) Disp. Time Eilene Ghazi Time) Disposition Final User 01/08/2022 11:46:55 AM See HCP within 4 Hours (or PCP triage) Yes Toribio Harbour, RN, Joelene Millin 01/08/2022 11:47:33 AM Called On-Call Provider Toribio Harbour, RN, Joelene Millin Reason: Called office for appt. No appts available. Final Disposition 01/08/2022 11:46:55 AM See HCP within 4 Hours (or PCP triage) Yes Toribio Harbour, RN, Renea Ee Disagree/Comply Comply Caller Understands Yes PreDisposition Call Doctor Care Advice Given Per Guideline SEE HCP (OR PCP TRIAGE) WITHIN 4 HOURS: * IF OFFICE WILL BE OPEN: You need to be seen within the next 3 or 4 hours. Call your doctor (or NP/PA) now or as soon as the office opens. CALL BACK IF: * You become worse CARE ADVICE given per Rash - Localized and Cause Unknown (Adult) guideline. Referrals [facility]

## 2022-01-08 NOTE — Discharge Instructions (Addendum)
I have enclosed some information about shingles that I hope you find helpful.  The dermatomal location of your shingles is C2, the occipital protuberance.  I sent 2 prescriptions to your pharmacy get you through this outbreak.    The first is valacyclovir.  This is an antiviral treatment used to treat herpes zoster.  You will take this 3 times a day for a full 7-day treatment.  Please pick this up and begin it as soon as possible, if you can complete your first 2 doses today making sure there are 6 hours apart, that would be ideal, then continue taking 3 times daily until the prescription is complete.  The second is gabapentin.  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  You will take gabapentin for shingles differently.  You can take this medication 4 times daily as needed.  You will take 1 capsule if you have mild to moderate pain and you will take 2 capsules if you have severe pain.  You do not have to take this medication 4 times a day every day but I recommend that you attempt to do so for the next several days just to get your pain under control.  If you find the above regimen is not providing you with sufficient relief of pain, please reach out to your primary care provider or return here to urgent care to discuss possible pain management with narcotics.  I do still recommend that you receive the shingles vaccine.  Please speak with your doctor about how long to wait to receive this vaccine.  Thank you for visiting urgent care today.

## 2022-01-08 NOTE — ED Triage Notes (Signed)
Pt reports having sensitivity (soreness) to back of head that radiates down her neck (right side). The patient states she did have some dizziness and and episode where she saw stars. The patient denies hitting her head, and denies having changes in her hearing and ear pain.   Started: 2 weeks   Home interventions: tylenol

## 2022-01-08 NOTE — ED Provider Notes (Signed)
[facility]    CSN: [id number] Arrival date & time: 01/08/22  1217    HISTORY   Chief Complaint  Patient presents with   Headache   Dizziness   HPI Christina Larsen is a pleasant, 66 y.o. female who presents to urgent care today. Patient complains of acute onset of intense soreness in the back of her head on the right side that radiates down to the right side of her neck.  Patient states she has been feeling a little bit dizzy and at one point "saw stars" secondary to pain.  Patient states she did not hit her head, has not had a recent fall, denies hearing changes, ear pain, jaw pain, vision changes, altered mental status.  Patient states this has been going on for about 2 weeks.  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  Patient has normal vital signs on arrival with the exception of a mildly elevated blood pressure which she states is not normal for her and likely related to her pain.  The history is provided by the patient.  Headache Associated symptoms: dizziness   Dizziness Associated symptoms: headaches    Past Medical History:  Diagnosis Date   Allergy    Coronary artery disease    11/9 NSTEMI, PCI to pLCx with aspiration thrombectomy/DESx1, occluded mRCA with left to right collaterals   Diabetes mellitus without complication (HCC)    HLD (hyperlipidemia)    Hypertension    Myocardial infarction [facility])    Patient Active Problem List   Diagnosis Date Noted   Coronary artery disease 09/27/2021   NSTEMI (non-ST elevated myocardial infarction) (HCC) 01/17/2020   Type 2 diabetes mellitus (HCC) 01/17/2020   Hyperlipidemia 01/17/2020   Past Surgical History:  Procedure Laterality Date   CARDIAC CATHETERIZATION     CHOLECYSTECTOMY  1992   CORONARY STENT INTERVENTION N/A 01/17/2020    Procedure: CORONARY STENT INTERVENTION;  Surgeon: Yvonne Kendall, MD;  Location: [facility] IN[facility];  Service: Cardiovascular;  Laterality: N/A;   LAMINECTOMY  2006   LEFT HEART CATH AND CORONARY ANGIOGRAPHY N/A 01/17/2020   Procedure: LEFT HEART CATH AND CORONARY ANGIOGRAPHY;  Surgeon: Yvonne Kendall, MD;  Location: [facility];  Service: Cardiovascular;  Laterality: N/A;   rotator cuff surgery     OB History   No obstetric history on file.    Home Medications    Prior to Admission medications   Medication Sig Start Date End Date Taking? Authorizing Provider  atorvastatin (LIPITOR) 80 MG tablet TAKE 1 TABLET BY MOUTH ONCE DAILY 05/15/21 05/15/22  [name], MD  clopidogrel (PLAVIX) 75 MG tablet Take 1 tablet (75 mg total) by mouth daily. 04/19/21   [name], MD  Dulaglutide (TRULICITY) 1.5 MG/0.5ML SOPN Inject 1.5 mg into the skin once a week. 04/10/21     gabapentin (NEURONTIN) 300 MG capsule Take 1 capsule (300 mg total) by mouth every 12 (twelve) hours as needed. 05/25/21     metFORMIN (GLUCOPHAGE) 1000 MG tablet TAKE 1 TABLET BY MOUTH TWICE DAILY FOR 90 DAYS 05/17/21     metoprolol succinate (TOPROL XL) 50 MG 24 hr tablet Take 1 tablet (50 mg total) by mouth daily. 04/19/21   [name], MD  Multiple Vitamin (MULTIVITAMIN) tablet Take 1 tablet by mouth daily.  [provider]  nitroGLYCERIN (NITROSTAT) 0.4 MG SL tablet PLACE 1 TABLET UNDER THE TONGUE EVERY 5 MINUTES AS NEEDED 03/14/20 03/14/21  [name], MD    Family History Family History  Problem Relation Age of Onset   Arthritis Mother    Diabetes Mother    Colon cancer Mother    Heart disease Father    Prostate cancer Father    Hypercholesterolemia Father    Cancer Brother    Diabetes Brother    Hyperlipidemia Brother    Hypertension Brother    Social History Social History   Tobacco Use   Smoking status: Former    Types: Cigarettes    Quit date: 01/16/2020    Years since quitting:  1.9   Smokeless tobacco: Never  Vaping Use   Vaping Use: Never used  Substance Use Topics   Alcohol use: Yes    Alcohol/week: 4.0 - 6.0 standard drinks of alcohol    Types: 3 - 4 Glasses of wine, 1 - 2 Shots of liquor per week   Drug use: Never   Allergies   Hydrocodone-acetaminophen  Review of Systems Review of Systems  Neurological:  Positive for dizziness and headaches.   Pertinent findings revealed after performing a 14 point review of systems has been noted in the history of present illness.  Physical Exam Triage Vital Signs ED Triage Vitals  Enc Vitals Group     BP 01/05/21 0827 (!) 147/82     Pulse Rate 01/05/21 0827 72     Resp 01/05/21 0827 18     Temp 01/05/21 0827 98.3 F (36.8 C)     Temp Source 01/05/21 0827 Oral     SpO2 01/05/21 0827 98 %     Weight --      Height --      Head Circumference --      Peak Flow --      Pain Score 01/05/21 0826 5     Pain Loc --      Pain Edu? --      Excl. in GC? --   No data found.  Updated Vital Signs BP (!) 141/95 (BP Location: Left Arm)   Pulse 78   Temp 97.8 F (36.6 C) (Oral)   Resp 16   SpO2 95%   Physical Exam Vitals and nursing note reviewed.  Constitutional:      General: She is not in acute distress.    Appearance: Normal appearance. She is not ill-appearing.  HENT:     Head: Normocephalic and atraumatic.     Salivary Glands: Right salivary gland is not diffusely enlarged or tender. Left salivary gland is not diffusely enlarged or tender.      Comments: TNTC small vesicular lesions on erythematous base in dermatomal pattern at C3    Right Ear: Tympanic membrane, ear canal and external ear normal. No drainage. No middle ear effusion. There is no impacted cerumen. Tympanic membrane is not erythematous or bulging.     Left Ear: Tympanic membrane, ear canal and external ear normal. No drainage.  No middle ear effusion. There is no impacted cerumen. Tympanic membrane is not erythematous or bulging.      Nose: Nose normal. No nasal deformity, septal deviation, mucosal edema, congestion or rhinorrhea.     Right Turbinates: Not enlarged, swollen or pale.     Left Turbinates: Not enlarged, swollen or pale.     Right Sinus: No maxillary sinus tenderness or frontal sinus tenderness.  Left Sinus: No maxillary sinus tenderness or frontal sinus tenderness.     Mouth/Throat:     Lips: Pink. No lesions.     Mouth: Mucous membranes are moist. No oral lesions.     Pharynx: Oropharynx is clear. Uvula midline. No posterior oropharyngeal erythema or uvula swelling.     Tonsils: No tonsillar exudate. 0 on the right. 0 on the left.  Eyes:     General: Lids are normal.        Right eye: No discharge.        Left eye: No discharge.     Extraocular Movements: Extraocular movements intact.     Conjunctiva/sclera: Conjunctivae normal.     Right eye: Right conjunctiva is not injected.     Left eye: Left conjunctiva is not injected.  Neck:     Thyroid: No thyroid mass or thyroid tenderness.     Trachea: Trachea and phonation normal.  Cardiovascular:     Rate and Rhythm: Normal rate and regular rhythm.     Pulses: Normal pulses.     Heart sounds: Normal heart sounds. No murmur heard.    No friction rub. No gallop.  Pulmonary:     Effort: Pulmonary effort is normal. No accessory muscle usage, prolonged expiration or respiratory distress.     Breath sounds: Normal breath sounds. No stridor, decreased air movement or transmitted upper airway sounds. No decreased breath sounds, wheezing, rhonchi or rales.  Chest:     Chest wall: No tenderness.  Musculoskeletal:     Cervical back: Neck supple. Erythema present. No edema, signs of trauma, rigidity, torticollis or crepitus. Pain with movement present. No spinous process tenderness or muscular tenderness (And spasm of cervical paraspinous muscles on the right side). Decreased range of motion (Secondary to exquisite pain).  Lymphadenopathy:     Cervical: No  cervical adenopathy.  Skin:    General: Skin is warm and dry.     Findings: No erythema or rash.  Neurological:     General: No focal deficit present.     Mental Status: She is alert and oriented to person, place, and time.  Psychiatric:        Mood and Affect: Mood normal.        Behavior: Behavior normal.     Visual Acuity Right Eye Distance:   Left Eye Distance:   Bilateral Distance:    Right Eye Near:   Left Eye Near:    Bilateral Near:     UC Couse / Diagnostics / Procedures:     Radiology No results found.  Procedures Procedures (including critical care time) EKG  Pending results:  Labs Reviewed - No data to display  Medications Ordered in UC: Medications - No data to display  UC Diagnoses / Final Clinical Impressions(s)   I have reviewed the triage vital signs and the nursing notes.  Pertinent labs & imaging results that were available during my care of the patient were reviewed by me and considered in my medical decision making (see chart for details).    Final diagnoses:  Herpes zoster without complication   Patient advised of physical exam findings.  Recommend valacyclovir given intensity of patient's pain despite duration and location of lesions on the back of her head.  Patient provided with a higher dose of gabapentin and advised to take 4 times daily for the next several days until she has pain under control then can reduce to as needed.  Patient advised if unable to obtain  adequate pain relief, may return to urgent care to discuss more aggressive pain relief medication such as narcotics.  Patient advised okay to try anti-inflammatory pain medications but they would likely be ineffective due to neuropathic etiology of pain.  Emergency room precautions advised.  ED Prescriptions     Medication Sig Dispense Auth. Provider   valACYclovir (VALTREX) 1000 MG tablet Take 1 tablet (1,000 mg total) by mouth 3 (three) times daily for 7 days. 21 tablet Theadora Rama Scales, PA-C   gabapentin (NEURONTIN) 300 MG capsule Take 1-2 capsules (300-600 mg total) by mouth 4 (four) times daily as needed for up to 15 days (For treatment of pain from shingles, take 1 cap for mild to moderate moderate pain, take 2 caps for severe pain). 120 capsule Theadora Rama Scales, PA-C      PDMP not reviewed this encounter.  Pending results:  Labs Reviewed - No data to display  Discharge Instructions:   Discharge Instructions      I have enclosed some information about shingles that I hope you find helpful.  The dermatomal location of your shingles is C2, the occipital protuberance.  I sent 2 prescriptions to your pharmacy get you through this outbreak.    The first is valacyclovir.  This is an antiviral treatment used to treat herpes zoster.  You will take this 3 times a day for a full 7-day treatment.  Please pick this up and begin it as soon as possible, if you can complete your first 2 doses today making sure there are 6 hours apart, that would be ideal, then continue taking 3 times daily until the prescription is complete.  The second is gabapentin.  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  You will take gabapentin for shingles differently.  You can take this medication 4 times daily as needed.  You will take 1 capsule if you have mild to moderate pain and you will take 2 capsules if you have severe pain.  You do not have to take this medication 4 times a day every day but I recommend that you attempt to do so for the next several days just to get your pain under control.  If you find the above regimen is not providing you with sufficient relief of pain, please reach out to your primary care provider or return here to urgent care to discuss possible pain management with narcotics.  I do still recommend that you receive the shingles vaccine.  Please speak with your doctor about how long to wait to receive this vaccine.  Thank you  for visiting urgent care today.      Disposition Upon Discharge:  Condition: stable for discharge home  Patient presented with an acute illness with associated systemic symptoms and significant discomfort requiring urgent management. In my opinion, this is a condition that a prudent lay person (someone who possesses an average knowledge of health and medicine) may potentially expect to result in complications if not addressed urgently such as respiratory distress, impairment of bodily function or dysfunction of bodily organs.   Routine symptom specific, illness specific and/or disease specific instructions were discussed with the patient and/or caregiver at length.   As such, the patient has been evaluated and assessed, work-up was performed and treatment was provided in alignment with urgent care protocols and evidence based medicine.  Patient/parent/caregiver has been advised that the patient may require follow up for further testing and treatment if the symptoms continue in spite  of treatment, as clinically indicated and appropriate.  Patient/parent/caregiver has been advised to return to the [facility] or PCP if no better; to PCP or the Emergency Department if new signs and symptoms develop, or if the current signs or symptoms continue to change or worsen for further workup, evaluation and treatment as clinically indicated and appropriate  The patient will follow up with their current PCP if and as advised. If the patient does not currently have a PCP we will assist them in obtaining one.   The patient may need specialty follow up if the symptoms continue, in spite of conservative treatment and management, for further workup, evaluation, consultation and treatment as clinically indicated and appropriate.   Patient/parent/caregiver verbalized understanding and agreement of plan as discussed.  All questions were addressed during visit.  Please see discharge instructions below for further details of  plan.  This office note has been dictated using Teaching laboratory technician.  Unfortunately, this method of dictation can sometimes lead to typographical or grammatical errors.  I apologize for your inconvenience in advance if this occurs.  Please do not hesitate to reach out to me if clarification is needed.      Theadora Rama Scales, PA-C 01/09/22 1231

## 2022-01-10 ENCOUNTER — Other Ambulatory Visit (HOSPITAL_BASED_OUTPATIENT_CLINIC_OR_DEPARTMENT_OTHER): Payer: Self-pay

## 2022-01-10 ENCOUNTER — Ambulatory Visit: Payer: PPO | Admitting: Family Medicine

## 2022-01-11 ENCOUNTER — Telehealth: Payer: Self-pay

## 2022-01-11 NOTE — Telephone Encounter (Signed)
Patient verification complete (name and date of birth).  Patient called stating she is still having pain to the right side of her neck/ back or right ear, she states there is lymph node swelling to the right side of her neck. The patient denies SOB. The patient was made aware that this is a normal finding and that continuation of the prescribed valtrex is needed per provider on site. The patient made aware that if she continues to have pain she should come back for evaluation or if she develops SOB she should report to the ER for further evaluation.

## 2022-01-23 NOTE — Progress Notes (Unsigned)
   Established Patient Office Visit  Subjective   Patient ID: Christina Larsen, female    DOB: 1956/02/08  Age: 66 y.o. MRN: [id number]  No chief complaint on file.   HPI  Patient is here to follow-up on recent labs.   She was last seen by me on *** with labs revealing hypertriglyceridemia and elevated A1c. She is here today to follow-up on these results.   HYPERLIPIDEMIA - medications: Lipitor 80 mg daily - compliance: *** - medication SEs: *** The ASCVD Risk score (Arnett DK, et al., 2019) failed to calculate for the following reasons:   The patient has a prior MI or stroke diagnosis   DIABETES: - Checking BG at home: *** - Medications: *** Trulicity 1.5 mg weekly, metformin 1000 mg BID - Compliance: *** - Diet: *** - Exercise: *** - Eye exam: *** - Foot exam: *** - Microalbumin: *** - Denies symptoms of hypoglycemia, polyuria, polydipsia, numbness extremities, foot ulcers/trauma, wounds that are not healing, medication side effects  Lab Results  Component Value Date   HGBA1C 9.3 (H) 10/08/2021    HYPERTENSION/CAD/NSTEMI: - Medications: metoprolol succinate 50 mg daily, Plavix - Compliance: *** - Checking BP at home: *** - Denies any SOB, recurrent headaches, CP, vision changes, LE edema, dizziness, palpitations, or medication side effects. - Diet: *** - Exercise: *** - Stressors:      {History (Optional):23778}  ROS    Objective:     There were no vitals taken for this visit. {Vitals History (Optional):23777}  Physical Exam   No results found for any visits on 01/24/22.  {Labs (Optional):23779}  The ASCVD Risk score (Arnett DK, et al., 2019) failed to calculate for the following reasons:   The patient has a prior MI or stroke diagnosis    Assessment & Plan:   Problem List Items Addressed This Visit   None   No follow-ups on file.    Clayborne Dana, NP

## 2022-01-24 ENCOUNTER — Encounter: Payer: Self-pay | Admitting: Family Medicine

## 2022-01-24 ENCOUNTER — Ambulatory Visit (INDEPENDENT_AMBULATORY_CARE_PROVIDER_SITE_OTHER): Payer: PPO | Admitting: Family Medicine

## 2022-01-24 VITALS — BP 132/82 | HR 74 | Resp 18 | Ht 65.0 in | Wt 160.4 lb

## 2022-01-24 DIAGNOSIS — Z23 Encounter for immunization: Secondary | ICD-10-CM | POA: Diagnosis not present

## 2022-01-24 DIAGNOSIS — E785 Hyperlipidemia, unspecified: Secondary | ICD-10-CM

## 2022-01-24 DIAGNOSIS — E119 Type 2 diabetes mellitus without complications: Secondary | ICD-10-CM | POA: Diagnosis not present

## 2022-01-24 LAB — COMPREHENSIVE METABOLIC PANEL
ALT: 25 U/L (ref 0–35)
AST: 18 U/L (ref 0–37)
Albumin: 4.6 g/dL (ref 3.5–5.2)
Alkaline Phosphatase: 74 U/L (ref 39–117)
BUN: 14 mg/dL (ref 6–23)
CO2: 28 mEq/L (ref 19–32)
Calcium: 9.8 mg/dL (ref 8.4–10.5)
Chloride: 102 mEq/L (ref 96–112)
Creatinine, Ser: 0.74 mg/dL (ref 0.40–1.20)
GFR: 83.17 mL/min (ref >60.00)
Glucose, Bld: 186 mg/dL — ABNORMAL HIGH (ref 70–99)
Potassium: 4.4 mEq/L (ref 3.5–5.1)
Sodium: 139 mEq/L (ref 135–145)
Total Bilirubin: 0.8 mg/dL (ref 0.2–1.2)
Total Protein: 7.5 g/dL (ref 6.0–8.3)

## 2022-01-24 LAB — MICROALBUMIN / CREATININE URINE RATIO
Creatinine,U: 224.6 mg/dL
Microalb Creat Ratio: 1.6 mg/g (ref 0.0–30.0)
Microalb, Ur: 3.6 mg/dL — ABNORMAL HIGH (ref 0.0–1.9)

## 2022-01-24 LAB — LIPID PANEL
Cholesterol: 135 mg/dL (ref 0–200)
HDL: 52.1 mg/dL (ref >39.00)
LDL Cholesterol: 45 mg/dL (ref 0–99)
NonHDL: 82.76
Total CHOL/HDL Ratio: 3
Triglycerides: 191 mg/dL — ABNORMAL HIGH (ref 0.0–149.0)
VLDL: 38.2 mg/dL (ref 0.0–40.0)

## 2022-01-24 LAB — HEMOGLOBIN A1C: Hgb A1c MFr Bld: 9.3 % — ABNORMAL HIGH (ref 4.6–6.5)

## 2022-01-24 NOTE — Assessment & Plan Note (Signed)
-  Medication management: continue lipitor  -Repeat CMP and lipid panel ordered -Lifestyle factors for lowering cholesterol include: Diet therapy - heart-healthy diet rich in fruits, veggies, fiber-rich whole grains, lean meats, chicken, fish (at least twice a week), fat-free or 1% dairy products; foods low in saturated/trans fats, cholesterol, sodium, and sugar. Mediterranean diet has shown to be very heart healthy. Regular exercise - recommend at least 30 minutes a day, 5 times per week Weight management

## 2022-01-24 NOTE — Patient Instructions (Signed)
Poorly controlled with last A1c >9 Continue current medications UTD on vaccines, eye exam, foot exam On Statin  Discussed diet and exercise  Lifestyle factors for lowering cholesterol include: Diet therapy - heart-healthy diet rich in fruits, veggies, fiber-rich whole grains, lean meats, chicken, fish (at least twice a week), fat-free or 1% dairy products; foods low in saturated/trans fats, cholesterol, sodium, and sugar. Mediterranean diet has shown to be very heart healthy. Regular exercise - recommend at least 30 minutes a day, 5 times per week Weight management

## 2022-01-24 NOTE — Assessment & Plan Note (Signed)
Poorly controlled with last A1c >9 Continue current medications UTD on vaccines, eye exam, foot exam On Statin  Discussed diet and exercise

## 2022-01-28 ENCOUNTER — Encounter: Payer: Self-pay | Admitting: Family Medicine

## 2022-01-28 MED ORDER — TRULICITY 3 MG/0.5ML ~~LOC~~ SOAJ: 3.0000 mg | SUBCUTANEOUS | 5 refills | Status: DC

## 2022-01-28 NOTE — Addendum Note (Signed)
Addended by: Hyman Hopes B on: 01/28/2022 08:30 AM   Modules accepted: Orders

## 2022-02-04 DIAGNOSIS — Z1231 Encounter for screening mammogram for malignant neoplasm of breast: Secondary | ICD-10-CM | POA: Diagnosis not present

## 2022-02-05 ENCOUNTER — Other Ambulatory Visit (HOSPITAL_BASED_OUTPATIENT_CLINIC_OR_DEPARTMENT_OTHER): Payer: Self-pay

## 2022-02-05 MED ORDER — GABAPENTIN 300 MG PO CAPS
300.0000 mg | ORAL_CAPSULE | Freq: Two times a day (BID) | ORAL | 0 refills | Status: DC | PRN
  Filled 2022-02-05: qty 30, 15d supply, fill #0

## 2022-02-08 ENCOUNTER — Other Ambulatory Visit: Payer: Self-pay

## 2022-02-08 ENCOUNTER — Telehealth: Payer: Self-pay | Admitting: Family Medicine

## 2022-02-08 DIAGNOSIS — E119 Type 2 diabetes mellitus without complications: Secondary | ICD-10-CM

## 2022-02-08 NOTE — Telephone Encounter (Signed)
faxed

## 2022-02-08 NOTE — Telephone Encounter (Signed)
Patient called to advise that the dose on her Trulicity prescription has been increased and the [place] [facility] told her that the provider can either call or fax in the new prescription but it would be easier/quicker if it was called in. [phone]. If prescription is faxed, it should be signed as well. Fax # [phone].

## 2022-02-13 ENCOUNTER — Other Ambulatory Visit (HOSPITAL_BASED_OUTPATIENT_CLINIC_OR_DEPARTMENT_OTHER): Payer: Self-pay

## 2022-02-13 ENCOUNTER — Encounter: Payer: Self-pay | Admitting: Family Medicine

## 2022-02-13 ENCOUNTER — Ambulatory Visit (INDEPENDENT_AMBULATORY_CARE_PROVIDER_SITE_OTHER): Payer: PPO | Admitting: Family Medicine

## 2022-02-13 VITALS — BP 120/80 | HR 96 | Temp 98.1°F | Ht 65.0 in | Wt 161.5 lb

## 2022-02-13 DIAGNOSIS — L02221 Furuncle of abdominal wall: Secondary | ICD-10-CM | POA: Diagnosis not present

## 2022-02-13 MED ORDER — FLUCONAZOLE 150 MG PO TABS
ORAL_TABLET | ORAL | 0 refills | Status: DC
  Filled 2022-02-13: qty 2, 2d supply, fill #0

## 2022-02-13 MED ORDER — DOXYCYCLINE HYCLATE 100 MG PO TABS
100.0000 mg | ORAL_TABLET | Freq: Two times a day (BID) | ORAL | 0 refills | Status: AC
  Filled 2022-02-13: qty 14, 7d supply, fill #0

## 2022-02-13 NOTE — Patient Instructions (Signed)
Ice/cold pack over area for 10-15 min twice daily.  OK to take Tylenol 1000 mg (2 extra strength tabs) or 975 mg (3 regular strength tabs) every 6 hours as needed.  Do not shower for the rest of the day. When you do wash it, use only soap and water. Do not vigorously scrub. Apply triple antibiotic ointment (like Neosporin) twice daily. Keep the area clean and dry.   Things to look out for: increasing pain not relieved by ice/acetaminophen, fevers, spreading redness, drainage of pus, or foul odor.  Let us know if you need anything.

## 2022-02-13 NOTE — Progress Notes (Signed)
Chief Complaint  Patient presents with   soreness/redness in naval area    Christina Larsen is a 66 y.o. female here for a skin complaint.  Duration: 1 day Location: around belly button Pruritic? No Painful? Yes Drainage? No Other associated symptoms: redness, redness; no fevers, trauma Therapies tried thus far: hydrogen peroxide  Past Medical History:  Diagnosis Date   Allergy    Coronary artery disease    11/9 NSTEMI, PCI to pLCx with aspiration thrombectomy/DESx1, occluded mRCA with left to right collaterals   Diabetes mellitus without complication (HCC)    HLD (hyperlipidemia)    Hypertension    Myocardial infarction (HCC)     BP 120/80 (BP Location: Left Arm, Patient Position: Sitting, Cuff Size: Normal)   Pulse 96   Temp 98.1 F (36.7 C) (Oral)   Ht 5\' 5"  (1.651 m)   Wt 161 lb 8 oz (73.3 kg)   SpO2 93%   BMI 26.88 kg/m  Gen: awake, alert, appearing stated age Lungs: No accessory muscle use Skin: to L of umbilicus, there is a patch of erythema and a circular lesion that is indurated and TTP. No drainage, fluctuance, excoriation Psych: Age appropriate judgment and insight  Furuncle of abdominal wall - Plan: doxycycline (VIBRA-TABS) 100 MG tablet  7 d of doxy. Ice, Tylenol, avoid digital manipulation. Warning signs and symptoms verbalized and written down in AVS.  F/u prn. The patient voiced understanding and agreement to the plan.  [place], DO 02/13/22 2:41 PM

## 2022-02-14 ENCOUNTER — Encounter: Payer: Self-pay | Admitting: Family Medicine

## 2022-02-17 ENCOUNTER — Encounter: Payer: Self-pay | Admitting: Family Medicine

## 2022-02-19 ENCOUNTER — Encounter: Payer: Self-pay | Admitting: Family Medicine

## 2022-02-19 ENCOUNTER — Other Ambulatory Visit (HOSPITAL_BASED_OUTPATIENT_CLINIC_OR_DEPARTMENT_OTHER): Payer: Self-pay

## 2022-02-19 ENCOUNTER — Ambulatory Visit (INDEPENDENT_AMBULATORY_CARE_PROVIDER_SITE_OTHER): Payer: PPO | Admitting: Family Medicine

## 2022-02-19 VITALS — BP 120/80 | HR 80 | Temp 98.2°F | Wt 161.0 lb

## 2022-02-19 DIAGNOSIS — L0291 Cutaneous abscess, unspecified: Secondary | ICD-10-CM

## 2022-02-19 MED ORDER — TRAMADOL HCL 50 MG PO TABS
50.0000 mg | ORAL_TABLET | Freq: Three times a day (TID) | ORAL | 0 refills | Status: AC | PRN
  Filled 2022-02-19: qty 15, 5d supply, fill #0

## 2022-02-19 NOTE — Patient Instructions (Addendum)
Continue icing as needed.  OK to take Tylenol 1000 mg (2 extra strength tabs) or 975 mg (3 regular strength tabs) every 6 hours as needed.  Do not drink alcohol, do any illicit/street drugs, drive or do anything that requires alertness while on the tramadol.   Do not shower for the rest of the day. When you do wash it, use only soap and water. Do not vigorously scrub.  Things to look out for: increasing pain not relieved by ibuprofen/acetaminophen, fevers, spreading redness, increased drainage of pus, or foul odor.  Let us know if you need anything.

## 2022-02-19 NOTE — Progress Notes (Signed)
Chief Complaint  Patient presents with   Follow-up    Cyst     Subjective: Patient is a 66 y.o. female here for f/u.  Patient was seen around a week ago and prescribed doxycycline for presumed infected area of skin.  She has been compliant with the medication and it is grown and become more painful.  There is no drainage from it and she denies any fevers.  There is no itching or spreading of redness.  She felt a lump that feels larger this week.  Past Medical History:  Diagnosis Date   Allergy    Coronary artery disease    11/9 NSTEMI, PCI to pLCx with aspiration thrombectomy/DESx1, occluded mRCA with left to right collaterals   Diabetes mellitus without complication (HCC)    HLD (hyperlipidemia)    Hypertension    Myocardial infarction (HCC)     Objective: BP 120/80 (BP Location: Left Arm, Patient Position: Sitting, Cuff Size: Normal)   Pulse 80   Temp 98.2 F (36.8 C) (Oral)   Wt 161 lb (73 kg)   SpO2 98%   BMI 26.79 kg/m  General: Awake, appears stated age Skin: Just left of the umbilicus, there is elliptically shaped patch of erythema with overlying fluctuance, no drainage Lungs: No accessory muscle use Psych: Age appropriate judgment and insight, normal affect and mood  Procedure note; incision and drainage Informed consent obtained. The area was cleaned with alcohol. The area was anesthetized with 3 mL of 1% lidocaine with epinephrine. Once adequate anesthesia was obtained, a cruciate incision was made with 11 blade scalpel. Approximately 7 mL of purulent material with blood was expressed. Loculations were interrupted with a curved hemostat. The area was packed with approximately 5 cm of 1/4 in iodoform gauze. The area was then dressed with gauze. There were no complications noted. The patient tolerated the procedure well.  Assessment and Plan: Abscess - Plan: PR DRAIN SKIN ABSCESS SIMPLE  Finish doxycycline.  Tylenol, tramadol for breakthrough pain.   Successful I&D today.  Follow-up in 1 week to remove packing. Warning signs and symptoms verbalized and written down in AVS.  The patient voiced understanding and agreement to the plan.  Jilda Roche [place], DO 02/19/22  11:58 AM

## 2022-02-20 ENCOUNTER — Encounter: Payer: Self-pay | Admitting: Family Medicine

## 2022-02-25 ENCOUNTER — Ambulatory Visit (INDEPENDENT_AMBULATORY_CARE_PROVIDER_SITE_OTHER): Payer: PPO | Admitting: Family Medicine

## 2022-02-25 ENCOUNTER — Encounter: Payer: Self-pay | Admitting: Family Medicine

## 2022-02-25 VITALS — BP 112/68 | HR 83 | Temp 97.7°F | Wt 161.0 lb

## 2022-02-25 DIAGNOSIS — Z09 Encounter for follow-up examination after completed treatment for conditions other than malignant neoplasm: Secondary | ICD-10-CM

## 2022-02-25 NOTE — Patient Instructions (Signed)
This looks good today.  There "lump" may be there for a few more weeks if it was only an infection.  If it was a cyst, it may never go away.  Keep things clean and dry.   Let us know if you need anything.

## 2022-02-25 NOTE — Progress Notes (Signed)
Chief Complaint  Patient presents with   Cyst    followup    Subjective: Patient is a 66 y.o. female here for follow-up.  Patient had I&D and abscess just left of her umbilicus 6 days ago.  The packing came out 1 day later.  She no longer has pain.  There is no spreading redness or fevers.  She did have some drainage.  She has been keeping it clean and dry.  Past Medical History:  Diagnosis Date   Allergy    Coronary artery disease    11/9 NSTEMI, PCI to pLCx with aspiration thrombectomy/DESx1, occluded mRCA with left to right collaterals   Diabetes mellitus without complication (HCC)    HLD (hyperlipidemia)    Hypertension    Myocardial infarction (HCC)     Objective: BP 112/68 (BP Location: Left Arm, Patient Position: Sitting, Cuff Size: Normal)   Pulse 83   Temp 97.7 F (36.5 C) (Oral)   Wt 161 lb (73 kg)   SpO2 94%   BMI 26.79 kg/m  General: Awake, appears stated age Skin: Circular/indurated area just to the left of the umbilicus, there is no longer any TTP or fluctuance, no excessive warmth, small area of excoriation where the incision was made from last week without any drainage. Lungs: No accessory muscle use Psych: Age appropriate judgment and insight, normal affect and mood  Assessment and Plan: Encounter for recheck of abscess following incision and drainage  This looks good, no signs of infection.  Counseled on what to expect.  Follow-up with me as needed. The patient voiced understanding and agreement to the plan.  Jilda Roche [place], DO 02/25/22  8:54 AM

## 2022-03-22 DIAGNOSIS — H524 Presbyopia: Secondary | ICD-10-CM | POA: Diagnosis not present

## 2022-03-22 DIAGNOSIS — H2513 Age-related nuclear cataract, bilateral: Secondary | ICD-10-CM | POA: Diagnosis not present

## 2022-03-22 DIAGNOSIS — H0102B Squamous blepharitis left eye, upper and lower eyelids: Secondary | ICD-10-CM | POA: Diagnosis not present

## 2022-03-22 DIAGNOSIS — E119 Type 2 diabetes mellitus without complications: Secondary | ICD-10-CM | POA: Diagnosis not present

## 2022-03-22 DIAGNOSIS — H52223 Regular astigmatism, bilateral: Secondary | ICD-10-CM | POA: Diagnosis not present

## 2022-03-22 DIAGNOSIS — H0102A Squamous blepharitis right eye, upper and lower eyelids: Secondary | ICD-10-CM | POA: Diagnosis not present

## 2022-03-22 LAB — HM DIABETES EYE EXAM

## 2022-04-08 ENCOUNTER — Other Ambulatory Visit (HOSPITAL_BASED_OUTPATIENT_CLINIC_OR_DEPARTMENT_OTHER): Payer: Self-pay

## 2022-04-08 ENCOUNTER — Other Ambulatory Visit: Payer: Self-pay

## 2022-04-08 ENCOUNTER — Other Ambulatory Visit: Payer: Self-pay | Admitting: Cardiovascular Disease

## 2022-04-09 ENCOUNTER — Other Ambulatory Visit (HOSPITAL_BASED_OUTPATIENT_CLINIC_OR_DEPARTMENT_OTHER): Payer: Self-pay

## 2022-04-10 ENCOUNTER — Other Ambulatory Visit (HOSPITAL_BASED_OUTPATIENT_CLINIC_OR_DEPARTMENT_OTHER): Payer: Self-pay

## 2022-04-10 MED ORDER — NITROGLYCERIN 0.4 MG SL SUBL
0.4000 mg | SUBLINGUAL_TABLET | SUBLINGUAL | 2 refills | Status: DC | PRN
  Filled 2022-04-10: qty 25, 7d supply, fill #0

## 2022-04-10 MED ORDER — ATORVASTATIN CALCIUM 80 MG PO TABS
80.0000 mg | ORAL_TABLET | Freq: Every day | ORAL | 11 refills | Status: DC
  Filled 2022-04-10: qty 30, 30d supply, fill #0

## 2022-04-11 ENCOUNTER — Other Ambulatory Visit (HOSPITAL_BASED_OUTPATIENT_CLINIC_OR_DEPARTMENT_OTHER): Payer: Self-pay

## 2022-04-11 ENCOUNTER — Other Ambulatory Visit: Payer: Self-pay

## 2022-04-12 ENCOUNTER — Other Ambulatory Visit (HOSPITAL_BASED_OUTPATIENT_CLINIC_OR_DEPARTMENT_OTHER): Payer: Self-pay

## 2022-04-12 MED ORDER — SHINGRIX 50 MCG/0.5ML IM SUSR
INTRAMUSCULAR | 1 refills | Status: DC
  Filled 2022-04-12: qty 0.5, 1d supply, fill #0

## 2022-04-15 ENCOUNTER — Other Ambulatory Visit (HOSPITAL_BASED_OUTPATIENT_CLINIC_OR_DEPARTMENT_OTHER): Payer: Self-pay

## 2022-04-15 ENCOUNTER — Ambulatory Visit: Payer: PPO | Attending: Cardiovascular Disease | Admitting: Cardiovascular Disease

## 2022-04-15 ENCOUNTER — Encounter: Payer: Self-pay | Admitting: Cardiovascular Disease

## 2022-04-15 VITALS — BP 134/76 | HR 75 | Ht 65.0 in | Wt 156.8 lb

## 2022-04-15 DIAGNOSIS — E785 Hyperlipidemia, unspecified: Secondary | ICD-10-CM

## 2022-04-15 DIAGNOSIS — I10 Essential (primary) hypertension: Secondary | ICD-10-CM | POA: Diagnosis not present

## 2022-04-15 DIAGNOSIS — E118 Type 2 diabetes mellitus with unspecified complications: Secondary | ICD-10-CM

## 2022-04-15 DIAGNOSIS — I214 Non-ST elevation (NSTEMI) myocardial infarction: Secondary | ICD-10-CM | POA: Diagnosis not present

## 2022-04-15 DIAGNOSIS — G478 Other sleep disorders: Secondary | ICD-10-CM | POA: Diagnosis not present

## 2022-04-15 DIAGNOSIS — E782 Mixed hyperlipidemia: Secondary | ICD-10-CM

## 2022-04-15 DIAGNOSIS — Z87891 Personal history of nicotine dependence: Secondary | ICD-10-CM

## 2022-04-15 MED ORDER — ATORVASTATIN CALCIUM 80 MG PO TABS
80.0000 mg | ORAL_TABLET | Freq: Every day | ORAL | 3 refills | Status: DC
  Filled 2022-04-15: qty 90, 90d supply, fill #0

## 2022-04-15 MED ORDER — METOPROLOL SUCCINATE ER 50 MG PO TB24
50.0000 mg | ORAL_TABLET | Freq: Every day | ORAL | 3 refills | Status: DC
  Filled 2022-04-15 – 2022-07-13 (×2): qty 90, 90d supply, fill #0
  Filled 2022-10-08: qty 90, 90d supply, fill #1
  Filled 2023-01-13: qty 90, 90d supply, fill #2
  Filled 2023-04-14: qty 90, 90d supply, fill #3

## 2022-04-15 MED ORDER — CLOPIDOGREL BISULFATE 75 MG PO TABS
75.0000 mg | ORAL_TABLET | Freq: Every day | ORAL | 3 refills | Status: DC
  Filled 2022-04-15 – 2022-07-13 (×2): qty 90, 90d supply, fill #0
  Filled 2022-10-08: qty 90, 90d supply, fill #1
  Filled 2023-01-13: qty 90, 90d supply, fill #2
  Filled 2023-04-14: qty 90, 90d supply, fill #3

## 2022-04-15 MED ORDER — ICOSAPENT ETHYL 1 G PO CAPS
1.0000 g | ORAL_CAPSULE | Freq: Two times a day (BID) | ORAL | 3 refills | Status: DC
  Filled 2022-04-15: qty 180, 90d supply, fill #0

## 2022-04-15 NOTE — Progress Notes (Signed)
Cardiology Office Note    Date:  04/15/2022   ID:  Christina Larsen, DOB Oct 11, 1955, MRN [id number]  PCP:  Terrilyn Saver, NP  Cardiologist:  Shelva Majestic, MD   6 month  F/U office visit  History of Present Illness:  Christina Larsen is a 67 y.o. female who is originally from [place].  She has a history of diabetes mellitus, hypertension, hyperlipidemia, and tobacco use.  She had undergone initial rotator cuff surgery in June 2021 and underwent a redo surgery at the end of October.  While watching [facility], she had developed significant chest pain and presented to [facility] where ECG showed anteroseptal ST depression.  Troponin was elevated to 85 > 860.  She was given aspirin and started on heparin and nitroglycerin.  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  She required aspiration thrombectomy for significant clot burden and underwent successful stenting of her proximal circumflex vessel.  There was distal circumflex disease treated medically.  I had seen her during her hospitalization.  We had a lengthy discussion regarding absolute smoking cessation and since her presentation she has not had any further cigarettes.  She has been undergoing physical therapy for her redo shoulder surgery.  She was seen for initial post hospital follow-up on November 30 by Kathrynn Humble, NP.  I saw her for my initial office evaluation on March 14, 2020.  At that time she was feeling well and denied recurrent chest pain symptomatology and was unaware of palpitations.  She continued to be on aspirin and Brilinta for DAPT for minimum of 1 year, and is on metoprolol tartrate 25 mg twice a day.  She was tolerating  atorvastatin 80 mg daily and was on metformin and Trulicity for her diabetes mellitus.  She has peripheral neuropathy and is on gabapentin.    She presented to the emergency room on May 07, 2020 with complaints of intermittent chest tightness.  Her chest pain was intermittent and not exertionally precipitated.  Troponins were negative.  Chest x-ray did not reveal acute abnormalities.  The chest pain was a stabbing pain to the right shoulder and was not felt to be ischemic in etiology.  She was subsequently evaluated by [name] on May 09, 2020 and remained stable.  I saw her on Jul 24, 2020.  On May 12 while walking her dog she had an episode of vertigo.  She was unaware of palpitations.  She denied any recurrent chest pain symptoms or exertional dyspnea.  She admits to poor sleep.  She also has experienced fatigue and daytime sleepiness.  An Epworth Sleepiness Scale score was therefore calculated in the office today and this endorsed at 16 consistent with excessive daytime sleepiness. She completed cardiac rehab.  She is now participating in [place] sneaker program at the gym.  During her evaluation, I felt she was stable with reference to CAD.  I raise concern for possible sleep apnea which may be contributing to her significant excessive daytime sleepiness and with her poor sleep and cardiovascular comorbidities a sleep study was recommended.  She underwent a diagnostic polysomnogram in October 16, 2020.  Her AHI was 0 but she did have increased respiratory disturbance index of 12.5.  There was no significant oxygen desaturation.  She had moderate snoring throughout the study.  He was felt most likely that she had increased upper airway resistance syndrome.  She was also noted to have periodic limb movement during sleep with associated arousals with an index of 10.5/h.  I saw on November 02, 2020.  At that time she was continuing to go to the  [place] sneakers program.  She denied any recurrent chest  pain.  She continues to be on DAPT with aspirin/Brilinta she is on atorvastatin 80 mg for aggressive lipid-lowering therapy.  She is diabetic on metformin and Trulicity.  She has neuropathy on gabapentin.  Remotely, she did try medication in 2006 for restless legs.    I saw her on April 19, 2021 at which time she remained stable and was without anginal symptomatology.  She denied any shortness of breath.  Fortunately she has stayed off tobacco since her presentation in November 2021.  At that time, she was wondering about longevity of continuing Brilinta due to cost and was subsequently transitioned to clopidogrel.  She continued to be on atorvastatin 80 mg for hyperlipidemia.  She was on metoprolol 25 mg twice a day.  At that time I suggested metoprolol to tartrate be discontinued and she was switched to metoprolol succinate 50 mg daily.  With her diabetes she was on metformin in addition to Trulicity.  She was seeing Dr. Jacky Kindle for primary care who was checking laboratory.  I last saw her on November 02, 2021. Since her prior evaluation, she has switched her primary care provider to Hyman Hopes, NP at [facility] primary care.  Presently, she continues to be stable and denies any exertional chest pain.  At times she notes an occasional musculoskeletal twinge.  She goes to the gym at least 2-3 times per week where she rides a bike and walks on the treadmill.  She continues to work.  She was recently started on over-the-counter fish oil for elevated triglycerides and has been trying to improve her diet.    Since I last saw her, she has remained stable from a cardiovascular standpoint.  She had hurt her back and as result has not been exercising as much as she had in the past.  She had developed an episode of shingles on her head which ultimately resolved and she just recently received an initial shingles vaccination.  She continues to be followed by Hyman Hopes, NP for primary care.  Most recent laboratory from  January 24, 2022 showed an elevated glucose at 186 and hemoglobin A1c at 9.3.  She is on metformin 1000 g twice a day and her dose of Trulicity was doubled to current dose of 3 mg weekly.  Lipid studies revealed total cholesterol 135, triglycerides 191, HDL 52, VLDL 38, and LDL 45.  Renal function is normal with creatinine 0.74.  She continues to be on atorvastatin 80 mg daily for hyperlipidemia.  She has continued long-term DAPT with aspirin/Plavix.  She takes Neurontin for neuropathy.  She continues to be on metoprolol succinate 50 mg daily for blood pressure and CAD.  She presents for evaluation.  Past Medical History:  Diagnosis Date   Allergy    Coronary artery disease    11/9 NSTEMI, PCI to pLCx with aspiration thrombectomy/DESx1, occluded mRCA with left to right collaterals   Diabetes mellitus without complication (HCC)    HLD (hyperlipidemia)    Hypertension    Myocardial infarction [facility])     Past Surgical History:  Procedure Laterality Date   CARDIAC CATHETERIZATION     CHOLECYSTECTOMY  1992   CORONARY STENT INTERVENTION N/A 01/17/2020   Procedure: CORONARY STENT INTERVENTION;  Surgeon: Nelva Bush, MD;  Location: [facility];  Service: Cardiovascular;  Laterality: N/A;   LAMINECTOMY  2006   LEFT HEART CATH AND CORONARY ANGIOGRAPHY N/A 01/17/2020   Procedure: LEFT HEART CATH AND CORONARY ANGIOGRAPHY;  Surgeon: Nelva Bush, MD;  Location: [facility];  Service: Cardiovascular;  Laterality: N/A;   rotator cuff surgery      Current Medications: Outpatient Medications Prior to Visit  Medication Sig Dispense Refill   Dulaglutide (TRULICITY) 3 RJ/1.8AC SOPN Inject 3 mg into the skin once a week. 2 mL 5   gabapentin (NEURONTIN) 300 MG capsule Take 1 capsule (300 mg total) by mouth every 12 (twelve) hours as needed. 30 capsule 0   metFORMIN (GLUCOPHAGE) 1000 MG tablet TAKE 1 TABLET BY MOUTH TWICE DAILY FOR 90 DAYS 180 tablet 3   Multiple Vitamin (MULTIVITAMIN)  tablet Take 1 tablet by mouth daily.     nitroGLYCERIN (NITROSTAT) 0.4 MG SL tablet Place 1 tablet (0.4 mg total) under the tongue every 5 (five) minutes x 3 doses as needed. Call 911 after 3rd dose if no relief 25 tablet 2   Zoster Vaccine Adjuvanted [facility]) injection Inject into the muscle. 0.5 mL 1   atorvastatin (LIPITOR) 80 MG tablet Take 1 tablet (80 mg total) by mouth daily. 30 tablet 11   clopidogrel (PLAVIX) 75 MG tablet Take 1 tablet (75 mg total) by mouth daily. 90 tablet 3   metoprolol succinate (TOPROL XL) 50 MG 24 hr tablet Take 1 tablet (50 mg total) by mouth daily. 90 tablet 3   fluconazole (DIFLUCAN) 150 MG tablet Take 1 tablet by mouth, repeat in 72 hours if no improvement. (Patient not taking: Reported on 04/15/2022) 2 tablet 0   gabapentin (NEURONTIN) 300 MG capsule Take 1-2 capsules (300-600 mg total) by mouth 4 (four) times daily as needed for up to 15 days (For treatment of pain from shingles, take 1 cap for mild to moderate moderate pain, take 2 caps for severe pain). 120 capsule 0   No facility-administered medications prior to visit.     Allergies:   Hydrocodone-acetaminophen   Social History   Socioeconomic History   Marital status: Widowed    Spouse name: Not on file   Number of children: 1   Years of education: 15   Highest education level: Not on file  Occupational History   Occupation: [employer]  Tobacco Use   Smoking status: Former    Types: Cigarettes    Quit date: 01/16/2020    Years since quitting: 2.2   Smokeless tobacco: Never  Vaping Use   Vaping Use: Never used  Substance and Sexual Activity   Alcohol use: Yes    Alcohol/week: 4.0 - 6.0 standard drinks of alcohol    Types: 3 - 4 Glasses of wine, 1 - 2 Shots of liquor per week   Drug use: Never   Sexual activity: Not Currently  Other Topics Concern   Not on file  Social History Narrative   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   Financial Resource Strain: Medium Risk  (11/21/2021)   Overall Financial Resource Strain (CARDIA)    Difficulty of Paying Living Expenses: Somewhat hard  Food Insecurity: Food Insecurity Present (11/21/2021)   Hunger Vital Sign    Worried About Estate manager/land agent of Food  in the Last Year: Sometimes true    Ran Out of Food in the Last Year: Never true  Transportation Needs: No Transportation Needs (11/21/2021)   PRAPARE - Administrator, Civil Service (Medical): No    Lack of Transportation (Non-Medical): No  Physical Activity: Inactive (11/21/2021)   Exercise Vital Sign    Days of Exercise per Week: 0 days    Minutes of Exercise per Session: 0 min  Stress: No Stress Concern Present (11/21/2021)   [employer] of Occupational Health - Occupational Stress Questionnaire    Feeling of Stress : Not at all  Social Connections: Moderately Isolated (11/21/2021)   Social Connection and Isolation Panel [NHANES]    Frequency of Communication with Friends and Family: Three times a week    Frequency of Social Gatherings with Friends and Family: Once a week    Attends Religious Services: More than 4 times per year    Active Member of [employer] or Organizations: No    Attends Banker Meetings: Never    Marital Status: Widowed    Socially she lives by herself.  She quit smoking the day of her heart attack.  She had been divorced from her husband who subsequently has died.  She has children.  Family History:  The patient's  family history includes Arthritis in her mother; Cancer in her brother; Colon cancer in her mother; Diabetes in her brother and mother; Heart disease in her father; Hypercholesterolemia in her father; Hyperlipidemia in her brother; Hypertension in her brother; Prostate cancer in her father.   ROS General: Negative; No fevers, chills, or night sweats;  HEENT: Negative; No changes in vision or hearing, sinus congestion, difficulty swallowing Pulmonary: Negative; No cough, wheezing, shortness of breath,  hemoptysis Cardiovascular: See HPI GI: Negative; No nausea, vomiting, diarrhea, or abdominal pain GU: Negative; No dysuria, hematuria, or difficulty voiding Musculoskeletal: Negative; no myalgias, joint pain, or weakness Hematologic/Oncology: Negative; no easy bruising, bleeding Endocrine: Negative; no heat/cold intolerance; no diabetes Neuro: Negative; no changes in balance, headaches Skin: Negative; No rashes or skin lesions Psychiatric: Negative; No behavioral problems, depression Sleep: Poor sleep, fatigue, daytime sleepiness, snoring; no bruxism, restless legs, hypnogognic hallucinations, no cataplexy Other comprehensive 14 point system review is negative.   PHYSICAL EXAM:   VS:  BP 134/76   Pulse 75   Ht 5\' 5"  (1.651 m)   Wt 156 lb 12.8 oz (71.1 kg)   SpO2 94%   BMI 26.09 kg/m     Repeat blood pressure by me was 130/82  Wt Readings from Last 3 Encounters:  04/15/22 156 lb 12.8 oz (71.1 kg)  02/25/22 161 lb (73 kg)  02/19/22 161 lb (73 kg)      Physical Exam BP 134/76   Pulse 75   Ht 5\' 5"  (1.651 m)   Wt 156 lb 12.8 oz (71.1 kg)   SpO2 94%   BMI 26.09 kg/m  General: Alert, oriented, no distress.  Skin: normal turgor, no rashes, warm and dry HEENT: Normocephalic, atraumatic. Pupils equal round and reactive to light; sclera anicteric; extraocular muscles intact; Fundi ** Nose without nasal septal hypertrophy Mouth/Parynx benign; Mallinpatti scale Neck: No JVD, no carotid bruits; normal carotid upstroke Lungs: clear to ausculatation and percussion; no wheezing or rales Chest wall: without tenderness to palpitation Heart: PMI not displaced, RRR, s1 s2 normal, 1/6 systolic murmur, no diastolic murmur, no rubs, gallops, thrills, or heaves Abdomen: soft, nontender; no hepatosplenomehaly, BS+; abdominal aorta nontender and not dilated  by palpation. Back: no CVA tenderness Pulses 2+ Musculoskeletal: full range of motion, normal strength, no joint  deformities Extremities: no clubbing cyanosis or edema, Homan's sign negative  Neurologic: grossly nonfocal; Cranial nerves grossly wnl Psychologic: Normal mood and affect    General: Alert, oriented, no distress.  Skin: normal turgor, no rashes, warm and dry HEENT: Normocephalic, atraumatic. Pupils equal round and reactive to light; sclera anicteric; extraocular muscles intact;  Nose without nasal septal hypertrophy Mouth/Parynx benign; Mallinpatti scale 3 Neck: No JVD, no carotid bruits; normal carotid upstroke Lungs: clear to ausculatation and percussion; no wheezing or rales Chest wall: without tenderness to palpitation Heart: PMI not displaced, RRR, s1 s2 normal, 1/6 systolic murmur, no diastolic murmur, no rubs, gallops, thrills, or heaves Abdomen: soft, nontender; no hepatosplenomehaly, BS+; abdominal aorta nontender and not dilated by palpation. Back: no CVA tenderness Pulses 2+ Musculoskeletal: full range of motion, normal strength, no joint deformities Extremities: no clubbing cyanosis or edema, Homan's sign negative  Neurologic: grossly nonfocal; Cranial nerves grossly wnl Psychologic: Normal mood and affect   Studies/Labs Reviewed:   April 15, 2022 ECG (independently read by me): Low atrial rhythm at 75, no ST changes, normal intervals   November 02, 2021 ECG (independently read by me): NSR at 73, LAHB  April 19, 2021 ECG (independently read by me):  NSR at 78, IRBBB, no ectopy  November 02, 2020 ECG (independently read by me): Normal sinus rhythm at 72 bpm, left axis deviation.  No ectopy.  No ST segment changes.  Jul 24, 2020 ECG (independently read by me): NSR at 72; Left axis deviation  January 2022 ECG (independently read by me): Normal sinus rhythm at 68 bpm.  Left axis deviation.  Nondiagnostic T wave abnormality.  Recent Labs:    Latest Ref Rng & Units 01/24/2022    8:34 AM 10/08/2021    7:11 AM 04/20/2021    8:11 AM  BMP  Glucose 70 - 99 mg/dL 960186  [id number]   [id number]   BUN 6 - 23 mg/dL 14  12  12    Creatinine 0.40 - 1.20 mg/dL 1.190.74  1.470.67  8.290.69   BUN/Creat Ratio 12 - 28   17   Sodium 135 - 145 mEq/L 139  140  141   Potassium 3.5 - 5.1 mEq/L 4.4  4.8  5.0   Chloride 96 - 112 mEq/L 102  104  102   CO2 19 - 32 mEq/L 28  28  22    Calcium 8.4 - 10.5 mg/dL 9.8  9.1  9.4         Latest Ref Rng & Units 01/24/2022    8:34 AM 10/08/2021    7:11 AM 04/20/2021    8:11 AM  Hepatic Function  Total Protein 6.0 - 8.3 g/dL 7.5  6.7  6.9   Albumin 3.5 - 5.2 g/dL 4.6  4.1  4.5   AST 0 - 37 U/L 18  16  15    ALT 0 - 35 U/L 25  25  17    Alk Phosphatase 39 - 117 U/L 74  67  85   Total Bilirubin 0.2 - 1.2 mg/dL 0.8  0.8  0.6        Latest Ref Rng & Units 10/08/2021    7:11 AM 11/01/2020   12:00 AM 05/07/2020   10:39 AM  CBC  WBC 4.0 - 10.5 K/uL 7.2  8.6     9.4   Hemoglobin 12.0 - 15.0 g/dL 56.214.3  13.015.8  15.0   Hematocrit 36.0 - 46.0 % 42.6  46     44.1   Platelets 150.0 - 400.0 K/uL 192.0  225     222      This result is from an external source.   Lab Results  Component Value Date   MCV 92.6 10/08/2021   MCV 94.0 05/07/2020   MCV 91 02/08/2020   Lab Results  Component Value Date   TSH 0.76 10/08/2021   Lab Results  Component Value Date   HGBA1C 9.3 (H) 01/24/2022     BNP No results found for: "BNP"  ProBNP No results found for: "PROBNP"   Lipid Panel     Component Value Date/Time   CHOL 135 01/24/2022 0834   CHOL 114 04/20/2021 0811   TRIG 191.0 (H) 01/24/2022 0834   HDL 52.10 01/24/2022 0834   HDL 47 04/20/2021 0811   CHOLHDL 3 01/24/2022 0834   VLDL 38.2 01/24/2022 0834   LDLCALC 45 01/24/2022 0834   LDLCALC 38 04/20/2021 0811   LABVLDL 29 04/20/2021 0811     RADIOLOGY: No results found.   Additional studies/ records that were reviewed today include:   01/17/2020 Conclusions: Significant two-vessel coronary artery disease with occlusions of the proximal LCx (acute with heavy thrombus burden) and mid RCA (chronic with  left-to-right collaterals).  Moderate, non-obstructive coronary artery disease is also present in the ramus intermedius and distal LCx (codominant vessel). Mid LAD myocardial bridge. Mildly elevated left ventricular filling pressure with lateral wall hypokinesis and mildly reduced contraction. Successful PCI to proximal LCx with aspiration thrombectomy and placement of Resolute Onyx 3.0 x 18 mm drug-eluting stent (postdilated to 3.3 mm) with 0% residual stenosis and TIMI-3 flow.   Recommendations: Dual antiplatelet therapy with aspirin and ticagrelor for at least 12 months. Aggressive secondary prevention, including high-intensity statin therapy and smoking cessation. Medical management of chronic total occlusion of mid RCA as well as moderate ramus/distal LCx disease. Remove right femoral artery sheath with manual compression two hours after discontinuation of bivalirudin  Intervention      ECHO IMPRESSIONS   1. Left ventricular ejection fraction, by estimation, is 55%. The left  ventricle has normal function. The left ventricle has no regional wall  motion abnormalities. Left ventricular diastolic parameters were normal.   2. Right ventricular systolic function is normal. The right ventricular  size is normal.   3. The mitral valve is normal in structure. No evidence of mitral valve  regurgitation. No evidence of mitral stenosis.   4. The aortic valve is normal in structure. Aortic valve regurgitation is  not visualized. Mild aortic valve sclerosis is present, with no evidence  of aortic valve stenosis.   5. Aortic dilatation noted. There is mild dilatation of the ascending  aorta, measuring 40 mm.   6. The inferior vena cava is normal in size with greater than 50%  respiratory variability, suggesting right atrial pressure of 3 mmHg.    ASSESSMENT:    1. Non-ST elevation (NSTEMI) myocardial infarction [facility](HCC): November 2021   2. Mixed hyperlipidemia   3. Hyperlipidemia with  target LDL less than 70   4. Essential hypertension   5. Type 2 diabetes mellitus with complication, without long-term current use of insulin (HCC)   6. UARS (upper airway resistance syndrome)   7. History of tobacco use: quit November 2021     PLAN:  Ms. Celene Squibbatricia Ranes is a very pleasant 67 year old female who is originally from [place].  She has a history  of previous significant tobacco use, hypertension, diabetes mellitus, as well as hyperlipidemia.  She developed an acute coronary syndrome 1 week following her redo right shoulder surgery and was found to have thrombotic occlusion of her proximal circumflex in November 2021.  Catheterization also revealed a chronic total occlusion of RCA which was well collateralized via septal perforators arising from the LAD.  She also had mild concomitant CAD in her LAD, moderate in the ramus intermediate, and in her proximal RCA.  She has not had any recurrent anginal symptomatology and underwent successful DES stenting after thrombectomy of her proximal circumflex.  Since her initial post hospital office visit with me, she apparently presented to the emergency room in February 2022 with atypical chest pain that was described as a stabbing sharp discomfort radiating to her right shoulder.  This most likely was musculoskeletal in etiology.  She has not had recurrent symptoms and has continued to be on DAPT following her STEMI and is tolerating this well.  She was treated with aspirin/Brilinta which she tolerated well but at her last office visit in February 2023 due to cost issues she was transition to generic clopidogrel.  She has continued to be stable and has not had any recurrent anginal symptomatology.  Her blood pressure today is stable and she is tolerating metoprolol succinate 50 mg daily.  She continues to be on high potency statin therapy with atorvastatin 80 mg. Laboratory from October 08, 2021 showed total cholesterol 111, HDL 44, LDL 28.  However triglycerides  remain elevated at 192 and subsequently she had initiated over the counter omega-3 fatty acid which she was taking intermittently.  Her most recent laboratory from January 24, 2022 continues to show elevation of triglycerides at 191.  Glucose was elevated at 196 and hemoglobin A1c significantly increased at 9.3.  Her blood pressure today is stable at 132/76.  Pulse is 75.  I am giving her a prescription for Vascepa to start initially at 1 capsule twice a day.  I discussed with her the "reduce it" trial and potential cardiovascular benefit.  I mention to her that this is just became available in a generic.  However cost may not yet be reduced.  If she is unable to afford the cost presently she can try N[place] B[place] fish oil 1400 mg of which 980 mg or EPA/DHA.  She is seeing Ladona Ridgel back for primary care.  She may ultimately need endocrinologic evaluation if her diabetes cannot be adequately controlled with target hemoglobin A1c at least less than 7.  She plans to resume 80 as her back improves.  I discussed benefit of having the RSV vaccination.  I will see her in 6 months, see her in 6 months for follow-up evaluation.     Medication Adjustments/Labs and Tests Ordered: Current medicines are reviewed at length with the patient today.  Concerns regarding medicines are outlined above.  Medication changes, Labs and Tests ordered today are listed in the Patient Instructions below. Patient Instructions  Medication Instructions:    Try Vascepa 1 gram  twice  a day  *If you need a refill on your cardiac medications before your next appointment, please call your pharmacy*   Lab Work:  Not needed   Per Dr Tresa Endo. Please have your primary to check lipids in 3 months along with your other labs.  If your triglycerides ar still greater that 150  would suggest -increasing  to 2 grams twice a day.   Testing/Procedures: Not  needed   Follow-Up:  At [facility], you and your health needs are our priority.  As  part of our continuing mission to provide you with exceptional heart care, we have created designated Provider Care Teams.  These Care Teams include your primary Cardiologist (physician) and Advanced Practice Providers (APPs -  Physician Assistants and Nurse Practitioners) who all work together to provide you with the care you need, when you need it.     Your next appointment:   6 month(s)  The format for your next appointment:   In Person  Provider:   Shelva Majestic, MD      Signed, Shelva Majestic, MD  04/15/2022 12:10 PM    [facility] [address], [place], [place], [place]  [address] Phone: [phone]

## 2022-04-15 NOTE — Patient Instructions (Addendum)
Medication Instructions:    Try Vascepa 1 gram  twice  a day  *If you need a refill on your cardiac medications before your next appointment, please call your pharmacy*   Lab Work:  Not needed   Per Dr Claiborne Billings. Please have your primary to check lipids in 3 months along with your other labs.  If your triglycerides ar still greater that 150  would suggest -increasing  to 2 grams twice a day.   Testing/Procedures: Not  needed   Follow-Up: At [facility], you and your health needs are our priority.  As part of our continuing mission to provide you with exceptional heart care, we have created designated Provider Care Teams.  These Care Teams include your primary Cardiologist (physician) and Advanced Practice Providers (APPs -  Physician Assistants and Nurse Practitioners) who all work together to provide you with the care you need, when you need it.     Your next appointment:   6 month(s)  The format for your next appointment:   In Person  Provider:   Shelva Majestic, MD

## 2022-04-17 ENCOUNTER — Other Ambulatory Visit: Payer: Self-pay | Admitting: Family Medicine

## 2022-04-17 DIAGNOSIS — I2510 Atherosclerotic heart disease of native coronary artery without angina pectoris: Secondary | ICD-10-CM

## 2022-04-17 DIAGNOSIS — E785 Hyperlipidemia, unspecified: Secondary | ICD-10-CM

## 2022-04-17 DIAGNOSIS — E119 Type 2 diabetes mellitus without complications: Secondary | ICD-10-CM

## 2022-04-17 NOTE — Progress Notes (Signed)
Ordering labs to be drawn prior to next appointment. Not checking lipids yet as she just started Vascepa 1 g BID and cardiology recommended checking after 3 months.

## 2022-05-02 ENCOUNTER — Encounter: Payer: Self-pay | Admitting: Family Medicine

## 2022-05-02 ENCOUNTER — Telehealth: Payer: Self-pay | Admitting: Family Medicine

## 2022-05-02 ENCOUNTER — Ambulatory Visit (INDEPENDENT_AMBULATORY_CARE_PROVIDER_SITE_OTHER): Payer: PPO | Admitting: Family Medicine

## 2022-05-02 ENCOUNTER — Other Ambulatory Visit: Payer: PPO

## 2022-05-02 DIAGNOSIS — E785 Hyperlipidemia, unspecified: Secondary | ICD-10-CM

## 2022-05-02 DIAGNOSIS — I2510 Atherosclerotic heart disease of native coronary artery without angina pectoris: Secondary | ICD-10-CM

## 2022-05-02 DIAGNOSIS — E119 Type 2 diabetes mellitus without complications: Secondary | ICD-10-CM

## 2022-05-02 NOTE — Addendum Note (Signed)
Addended by: Manuela Schwartz on: 05/02/2022 07:59 AM   Modules accepted: Orders

## 2022-05-02 NOTE — Telephone Encounter (Signed)
Pt wasn't sure if she needed A1C checked as well. She remembered something about the lipid panel. The appointment is already set for labs so please clarify with patient which labs will be done that day.

## 2022-05-02 NOTE — Progress Notes (Signed)
Established Patient Office Visit  Subjective   Patient ID: Christina Larsen, female    DOB: Jul 23, 1955  Age: 67 y.o. MRN: [id number]  Chief Complaint  Patient presents with   Follow-up    HPI  Patient is for routine follow-up. Reports she has been feeling well overall.   HYPERLIPIDEMIA - medications: Lipitor 80 mg daily, 1400 mg fish oil - compliance: good - medication SEs: no - Patient saw cardiology on 04/15/22 and they started her on Vascepa 1 g BID with instructions to repeat labs in 3 months and if triglycerides are still higher than 150, increase Vascepa to 2 g BID. Cost was going to be $200 which she could not afford. She contacted their office and they recommended getting 1400 mg fish oil instead and have [place] continue to monitor. She has only been taking or 2 weeks now. We will recheck in 2-3 months to see if improving. The ASCVD Risk score (Arnett DK, et al., 2019) failed to calculate for the following reasons:   The patient has a prior MI or stroke diagnosis   DIABETES: - Checking BG at home: rarely - Medications: Trulicity 3 mg weekly, metformin 1000 mg BID - Compliance: good - Eye exam: pt to schedule - Microalbumin: UTD - Denies symptoms of hypoglycemia, polyuria, polydipsia, numbness extremities, foot ulcers/trauma, wounds that are not healing, medication side effects  Lab Results  Component Value Date   HGBA1C 9.3 (H) 01/24/2022     CAD/NSTEMI: following with cardiology every 6 months - Medications: metoprolol succinate 50 mg daily, Plavix - Compliance: good - Checking BP at home: not consistently  - Denies any SOB, recurrent headaches, CP, vision changes, LE edema, dizziness, palpitations, or medication side effects. 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 Her previous  ortho provider was giving this to her, but he has moved away and she feels like it very well controlled on this regimen and asked me to take over prescribing.       ROS All review of systems negative except what is listed in the HPI    Objective:     BP 132/82   Pulse 80   Temp 98.3 F (36.8 C)   Ht 5' 5"$  (1.651 m)   Wt 159 lb (72.1 kg)   SpO2 96%   BMI 26.46 kg/m    Physical Exam Vitals reviewed.  Constitutional:      Appearance: Normal appearance.  Cardiovascular:     Rate and Rhythm: Normal rate and regular rhythm.     Pulses: Normal pulses.     Heart sounds: Normal heart sounds.  Pulmonary:     Effort: Pulmonary effort is normal.     Breath sounds: Normal breath sounds.  Skin:    General: Skin is warm and dry.  Neurological:     Mental Status: She is alert and oriented to person, place, and time.  Psychiatric:        Mood and Affect: Mood normal.        Behavior: Behavior normal.        Thought Content: Thought content normal.        Judgment: Judgment normal.      No results found for any visits on 05/02/22.    The ASCVD Risk score (Arnett DK, et al.,  2019) failed to calculate for the following reasons:   The patient has a prior MI or stroke diagnosis    Assessment & Plan:   Problem List Items Addressed This Visit       Cardiovascular and Mediastinum   Coronary artery disease    Following with cardiology. No acute concerns today. Continue current regimen.         Endocrine   Type 2 diabetes mellitus (HCC) (Chronic)    Poorly controlled with last A1c >9, recheck today  Continue Trulicity 3 mg weekly and metformin 1000 mg BID; adjust pending labs  UTD on vaccines, foot exam On Statin  Discussed diet and exercise        Other   Hyperlipidemia (Chronic)    -Medication management: continue lipitor 80 mg and 1400 mg fish oil daily -Repeat lipids in 2-3 months (just recently started fish oil) -Lifestyle factors for lowering cholesterol  include: Diet therapy - heart-healthy diet rich in fruits, veggies, fiber-rich whole grains, lean meats, chicken, fish (at least twice a week), fat-free or 1% dairy products; foods low in saturated/trans fats, cholesterol, sodium, and sugar. Mediterranean diet has shown to be very heart healthy. Regular exercise - recommend at least 30 minutes a day, 5 times per week Weight management          Return in about 6 months (around 10/31/2022) for routine follow-up; labs only in 3 months.    Terrilyn Saver, NP

## 2022-05-02 NOTE — Assessment & Plan Note (Signed)
Following with cardiology. No acute concerns today. Continue current regimen.

## 2022-05-02 NOTE — Assessment & Plan Note (Signed)
-  Medication management: continue lipitor 80 mg and 1400 mg fish oil daily -Repeat lipids in 2-3 months (just recently started fish oil) -Lifestyle factors for lowering cholesterol include: Diet therapy - heart-healthy diet rich in fruits, veggies, fiber-rich whole grains, lean meats, chicken, fish (at least twice a week), fat-free or 1% dairy products; foods low in saturated/trans fats, cholesterol, sodium, and sugar. Mediterranean diet has shown to be very heart healthy. Regular exercise - recommend at least 30 minutes a day, 5 times per week Weight management

## 2022-05-02 NOTE — Assessment & Plan Note (Signed)
Poorly controlled with last A1c >9, recheck today  Continue Trulicity 3 mg weekly and metformin 1000 mg BID; adjust pending labs  UTD on vaccines, foot exam On Statin  Discussed diet and exercise

## 2022-05-02 NOTE — Addendum Note (Signed)
Addended by: Manuela Schwartz on: 05/02/2022 07:12 AM   Modules accepted: Orders

## 2022-05-03 ENCOUNTER — Telehealth: Payer: Self-pay

## 2022-05-03 ENCOUNTER — Other Ambulatory Visit (HOSPITAL_BASED_OUTPATIENT_CLINIC_OR_DEPARTMENT_OTHER): Payer: Self-pay

## 2022-05-03 ENCOUNTER — Encounter: Payer: Self-pay | Admitting: Family Medicine

## 2022-05-03 DIAGNOSIS — E119 Type 2 diabetes mellitus without complications: Secondary | ICD-10-CM

## 2022-05-03 LAB — HEMOGLOBIN A1C
Hgb A1c MFr Bld: 8.3 % of total Hgb — ABNORMAL HIGH (ref <5.7)
Mean Plasma Glucose: 192 mg/dL
eAG (mmol/L): 10.6 mmol/L

## 2022-05-03 MED ORDER — TRULICITY 4.5 MG/0.5ML ~~LOC~~ SOAJ
4.5000 mg | SUBCUTANEOUS | 2 refills | Status: DC
  Filled 2022-05-03: qty 2, 28d supply, fill #0

## 2022-05-03 MED ORDER — TRULICITY 4.5 MG/0.5ML ~~LOC~~ SOAJ: 4.5000 mg | SUBCUTANEOUS | 2 refills | Status: DC

## 2022-05-03 NOTE — Addendum Note (Signed)
Addended by: Caleen Jobs B on: 05/03/2022 10:41 AM   Modules accepted: Orders

## 2022-05-03 NOTE — Telephone Encounter (Signed)
Pt called to advise that Trulicity medication needs to be sent [facility]  [phone]) not the [facility]. Pt would like mychart message or call to advise when sent to the correct pharmacy.

## 2022-05-03 NOTE — Telephone Encounter (Signed)
PA initiated via Covermymeds; KEY: [id number]. Awaiting determination.

## 2022-05-03 NOTE — Telephone Encounter (Signed)
PA approved.   [id number] Trulicity 4.'5MG'$ /0.5ML [place] SOPN Quantity:2;

## 2022-05-03 NOTE — Telephone Encounter (Signed)
Left detailed message on machine of which labs will be done (cmp, A1C, lipid)

## 2022-05-07 ENCOUNTER — Telehealth: Payer: Self-pay | Admitting: Family Medicine

## 2022-05-07 DIAGNOSIS — E119 Type 2 diabetes mellitus without complications: Secondary | ICD-10-CM

## 2022-05-07 MED ORDER — TRULICITY 4.5 MG/0.5ML ~~LOC~~ SOAJ: 4.5000 mg | SUBCUTANEOUS | 2 refills | Status: DC

## 2022-05-07 NOTE — Telephone Encounter (Signed)
Pt called stating that the pharmacy has not received the script for Trulicity. Pt stated they told her it may be quicker to fax a signed script to them at the following number:  [phone]

## 2022-05-07 NOTE — Telephone Encounter (Signed)
Patient notified that rx has been faxed in.

## 2022-05-15 ENCOUNTER — Other Ambulatory Visit (HOSPITAL_BASED_OUTPATIENT_CLINIC_OR_DEPARTMENT_OTHER): Payer: Self-pay

## 2022-05-15 ENCOUNTER — Encounter: Payer: Self-pay | Admitting: Family Medicine

## 2022-05-15 DIAGNOSIS — E785 Hyperlipidemia, unspecified: Secondary | ICD-10-CM

## 2022-05-15 DIAGNOSIS — E119 Type 2 diabetes mellitus without complications: Secondary | ICD-10-CM

## 2022-05-15 MED ORDER — METFORMIN HCL 1000 MG PO TABS
1000.0000 mg | ORAL_TABLET | Freq: Two times a day (BID) | ORAL | 3 refills | Status: DC
  Filled 2022-05-15: qty 180, 90d supply, fill #0
  Filled 2022-08-05: qty 180, 90d supply, fill #1
  Filled 2022-11-06: qty 180, 90d supply, fill #2
  Filled 2023-01-29: qty 180, 90d supply, fill #3

## 2022-05-15 MED ORDER — ATORVASTATIN CALCIUM 80 MG PO TABS
80.0000 mg | ORAL_TABLET | Freq: Every day | ORAL | 3 refills | Status: DC
  Filled 2022-05-15: qty 90, 90d supply, fill #0
  Filled 2022-08-05: qty 90, 90d supply, fill #1
  Filled 2022-11-06: qty 90, 90d supply, fill #2
  Filled 2023-01-29: qty 90, 90d supply, fill #3

## 2022-07-15 ENCOUNTER — Other Ambulatory Visit (HOSPITAL_BASED_OUTPATIENT_CLINIC_OR_DEPARTMENT_OTHER): Payer: Self-pay

## 2022-07-19 ENCOUNTER — Other Ambulatory Visit (HOSPITAL_BASED_OUTPATIENT_CLINIC_OR_DEPARTMENT_OTHER): Payer: Self-pay

## 2022-07-19 MED ORDER — SHINGRIX 50 MCG/0.5ML IM SUSR
INTRAMUSCULAR | 0 refills | Status: DC
  Filled 2022-07-19: qty 0.5, 1d supply, fill #0

## 2022-07-31 ENCOUNTER — Other Ambulatory Visit (INDEPENDENT_AMBULATORY_CARE_PROVIDER_SITE_OTHER): Payer: PPO

## 2022-07-31 DIAGNOSIS — E119 Type 2 diabetes mellitus without complications: Secondary | ICD-10-CM | POA: Diagnosis not present

## 2022-07-31 DIAGNOSIS — E785 Hyperlipidemia, unspecified: Secondary | ICD-10-CM

## 2022-07-31 LAB — COMPREHENSIVE METABOLIC PANEL
ALT: 20 U/L (ref 0–35)
AST: 17 U/L (ref 0–37)
Albumin: 4.3 g/dL (ref 3.5–5.2)
Alkaline Phosphatase: 64 U/L (ref 39–117)
BUN: 15 mg/dL (ref 6–23)
CO2: 28 mEq/L (ref 19–32)
Calcium: 9.8 mg/dL (ref 8.4–10.5)
Chloride: 103 mEq/L (ref 96–112)
Creatinine, Ser: 0.72 mg/dL (ref 0.40–1.20)
GFR: 85.64 mL/min (ref >60.00)
Glucose, Bld: 129 mg/dL — ABNORMAL HIGH (ref 70–99)
Potassium: 4.4 mEq/L (ref 3.5–5.1)
Sodium: 140 mEq/L (ref 135–145)
Total Bilirubin: 0.8 mg/dL (ref 0.2–1.2)
Total Protein: 6.9 g/dL (ref 6.0–8.3)

## 2022-07-31 LAB — HEMOGLOBIN A1C: Hgb A1c MFr Bld: 7.5 % — ABNORMAL HIGH (ref 4.6–6.5)

## 2022-07-31 LAB — LIPID PANEL
Cholesterol: 103 mg/dL (ref 0–200)
HDL: 44.8 mg/dL (ref >39.00)
LDL Cholesterol: 30 mg/dL (ref 0–99)
NonHDL: 58.51
Total CHOL/HDL Ratio: 2
Triglycerides: 143 mg/dL (ref 0.0–149.0)
VLDL: 28.6 mg/dL (ref 0.0–40.0)

## 2022-08-01 IMAGING — DX DG CHEST 1V PORT
1 series · 1 of 1 positions shown · non-contrast
Comparison: None.

CLINICAL DATA: Substernal chest pain

EXAM:
PORTABLE CHEST 1 VIEW

[chest ap]
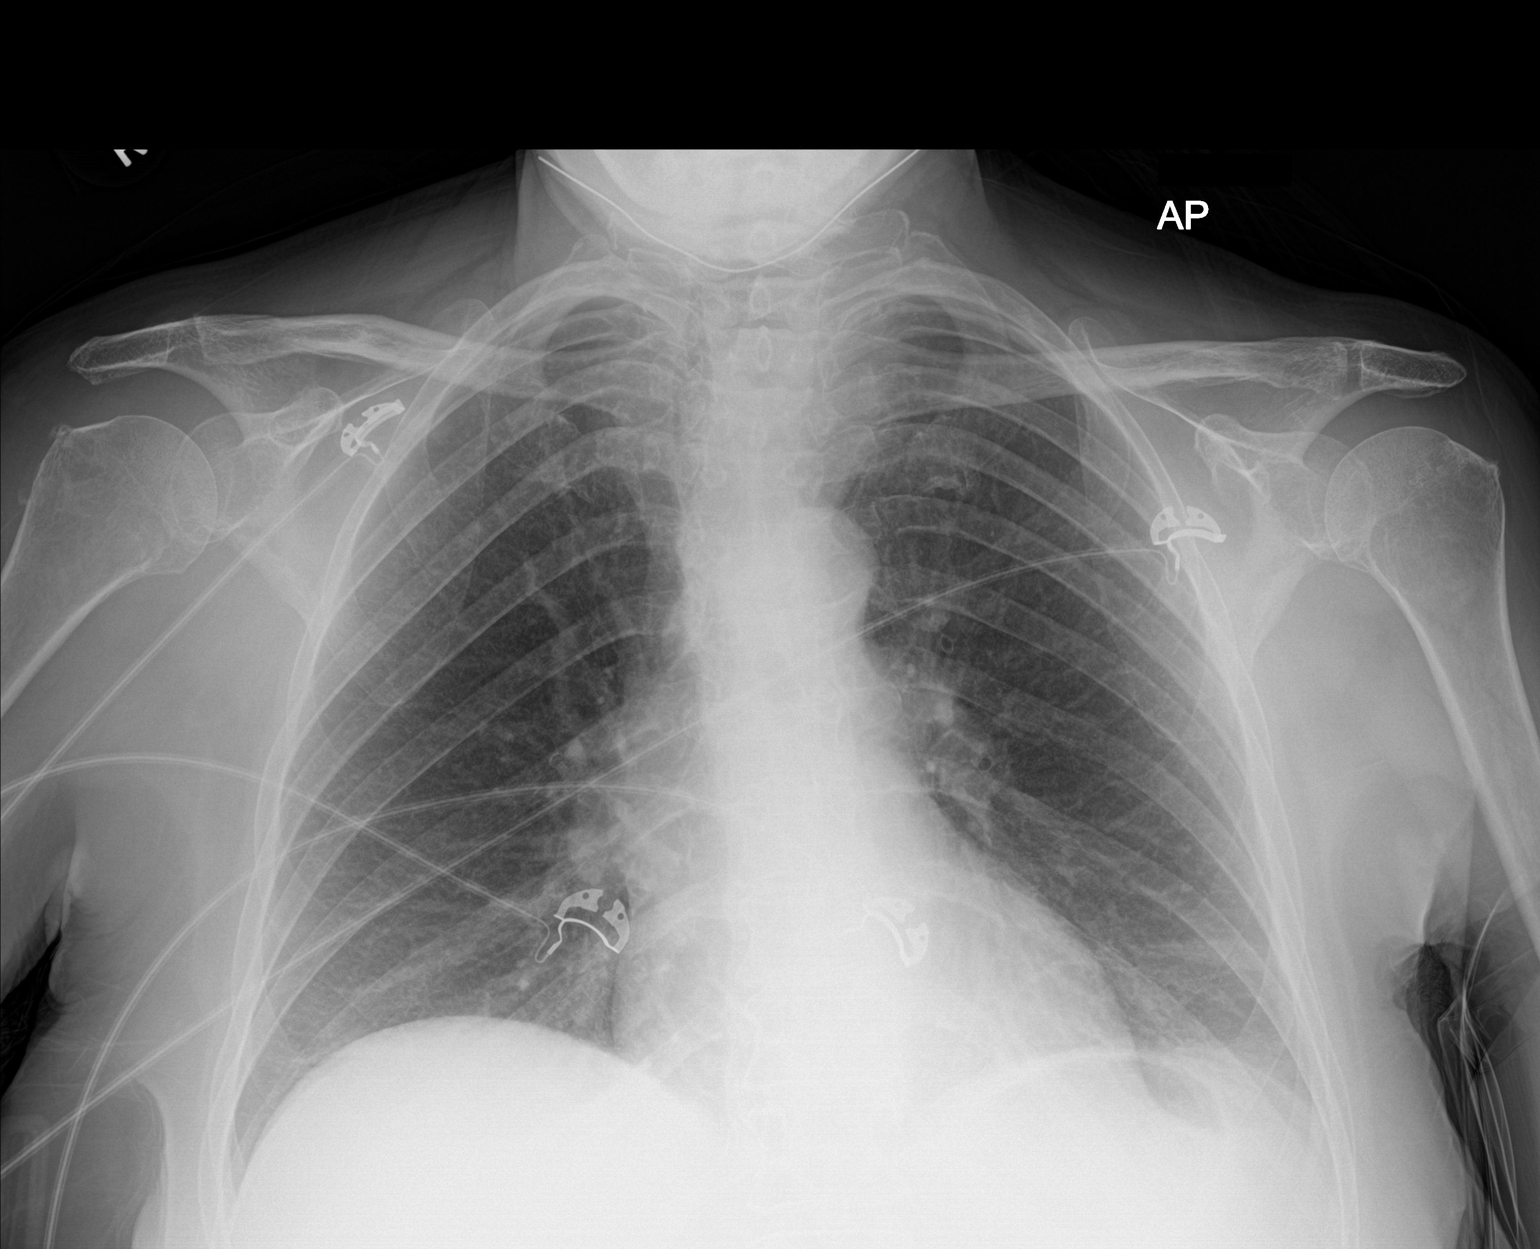

[1 of 1 positions shown; findings below may reference images not displayed]

FINDINGS: The heart size and mediastinal contours are within normal limits.
Aortic knob calcifications are seen. Subsegmental atelectasis seen
at both lung bases. The visualized skeletal structures are
unremarkable.
IMPRESSION: No active disease.

## 2022-09-18 ENCOUNTER — Telehealth: Payer: Self-pay | Admitting: Family Medicine

## 2022-09-18 NOTE — Telephone Encounter (Signed)
Pt dropped off paperwork to be completed by PCP. 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 PT asks to have form faxed to number on sticky note. Papers placed in PCP's tray in front office.

## 2022-09-19 NOTE — Telephone Encounter (Signed)
Forms completed and faxed to Rico Sheehan at [phone] with confirmation received.

## 2022-10-09 ENCOUNTER — Encounter (INDEPENDENT_AMBULATORY_CARE_PROVIDER_SITE_OTHER): Payer: Self-pay

## 2022-10-16 ENCOUNTER — Other Ambulatory Visit: Payer: Self-pay | Admitting: *Deleted

## 2022-10-16 ENCOUNTER — Telehealth: Payer: Self-pay | Admitting: Family Medicine

## 2022-10-16 DIAGNOSIS — E119 Type 2 diabetes mellitus without complications: Secondary | ICD-10-CM

## 2022-10-16 MED ORDER — TRULICITY 4.5 MG/0.5ML ~~LOC~~ SOPN
4.5000 mg | PEN_INJECTOR | SUBCUTANEOUS | 99 refills | Status: AC
Start: 2022-10-16 — End: ?

## 2022-10-16 NOTE — Telephone Encounter (Signed)
Pt came in office stating is needing a rx sent via fax [phone] fro Trulicity 4.5 mg 0.27ml with instruction stating REFILLS AS NEEDED. Please advise. Pt tel [phone].

## 2022-10-16 NOTE — Telephone Encounter (Signed)
Refill sent to pharmacy.   

## 2022-10-22 ENCOUNTER — Other Ambulatory Visit (HOSPITAL_BASED_OUTPATIENT_CLINIC_OR_DEPARTMENT_OTHER): Payer: Self-pay

## 2022-10-22 ENCOUNTER — Ambulatory Visit (INDEPENDENT_AMBULATORY_CARE_PROVIDER_SITE_OTHER): Payer: PPO | Admitting: Physician Assistant

## 2022-10-22 VITALS — BP 138/86 | HR 86 | Temp 98.6°F | Resp 20 | Wt 159.0 lb

## 2022-10-22 DIAGNOSIS — M546 Pain in thoracic spine: Secondary | ICD-10-CM

## 2022-10-22 MED ORDER — TRAMADOL HCL 50 MG PO TABS
50.0000 mg | ORAL_TABLET | Freq: Three times a day (TID) | ORAL | 0 refills | Status: AC | PRN
Start: 2022-10-22 — End: 2022-10-27
  Filled 2022-10-22: qty 15, 5d supply, fill #0

## 2022-10-22 MED ORDER — PREDNISONE 20 MG PO TABS
20.0000 mg | ORAL_TABLET | Freq: Every day | ORAL | 0 refills | Status: DC
Start: 2022-10-22 — End: 2022-10-31
  Filled 2022-10-22: qty 5, 5d supply, fill #0

## 2022-10-22 NOTE — Progress Notes (Signed)
Established patient visit   Patient: Christina Larsen   DOB: 1956/01/09   67 y.o. Female  MRN: [id number] Visit Date: 10/22/2022  Today's healthcare provider: Alfredia Ferguson, PA-C   Chief Complaint  Patient presents with   Back Pain    Moving and moving boxes for about two weeks and mid back pain and no release with Tylenol or bio freeze    Subjective    HPI   Patient reports she has moved and has been carrying boxes over the last few weeks she had a large remove a few days ago and since Saturday she has been experiencing significant thoracic and lumbar back pain.  This is impeding her ability to sit comfortably lay comfortably or walk.  She reports pain while moving in any direction especially bending.  Denies any numbness and tingling in her extremities denies any changes to bowel or bladder movements.  She does have a distant history of an unspecified back surgery.  Reports her fasting blood sugars have been around the 150s.  Medications: Outpatient Medications Prior to Visit  Medication Sig   atorvastatin (LIPITOR) 80 MG tablet Take 1 tablet (80 mg total) by mouth daily.   clopidogrel (PLAVIX) 75 MG tablet Take 1 tablet (75 mg total) by mouth daily.   Dulaglutide (TRULICITY) 4.5 MG/0.5ML SOPN Inject 4.5 mg as directed once a week.   gabapentin (NEURONTIN) 300 MG capsule Take 1 capsule (300 mg total) by mouth every 12 (twelve) hours as needed.   metFORMIN (GLUCOPHAGE) 1000 MG tablet Take 1 tablet (1,000 mg total) by mouth in the morning and at bedtime.   metoprolol succinate (TOPROL XL) 50 MG 24 hr tablet Take 1 tablet (50 mg total) by mouth daily.   Multiple Vitamin (MULTIVITAMIN) tablet Take 1 tablet by mouth daily.   nitroGLYCERIN (NITROSTAT) 0.4 MG SL tablet Place 1 tablet (0.4 mg total) under the tongue every 5 (five) minutes x 3 doses as needed. Call 911 after 3rd dose if no relief   Zoster Vaccine Adjuvanted [facility]) injection Inject into the muscle.   No  facility-administered medications prior to visit.    Review of Systems  Constitutional:  Negative for fatigue and fever.  Respiratory:  Negative for cough and shortness of breath.   Cardiovascular:  Negative for chest pain and leg swelling.  Gastrointestinal:  Negative for abdominal pain.  Musculoskeletal:  Positive for back pain.  Neurological:  Negative for dizziness and headaches.      Objective    BP 138/86 (BP Location: Left Arm, Patient Position: Sitting, Cuff Size: Normal)   Pulse 86   Temp 98.6 F (37 C) (Oral)   Resp 20   Wt 159 lb (72.1 kg)   SpO2 96%   BMI 26.46 kg/m   Physical Exam Vitals reviewed.  Constitutional:      Appearance: She is not ill-appearing.  HENT:     Head: Normocephalic.  Eyes:     Conjunctiva/sclera: Conjunctivae normal.  Cardiovascular:     Rate and Rhythm: Normal rate.  Pulmonary:     Effort: Pulmonary effort is normal. No respiratory distress.  Musculoskeletal:     Comments: Tightness and tenderness across her upper back below shoulder blades. No visible edema or rashes  Neurological:     General: No focal deficit present.     Mental Status: She is alert and oriented to person, place, and time.  Psychiatric:        Mood and Affect: Mood normal.  Behavior: Behavior normal.      No results found for any visits on 10/22/22.  Assessment & Plan     1. Acute bilateral thoracic back pain  Advised a short course of steroids and tramadol every 8 hours as needed for inflammation and pain relief.  Recommending gentle movement and stretching without pain, ice or heat.  If no improvement would try muscle relaxant and can always refer to physical therapy.  - traMADol (ULTRAM) 50 MG tablet; Take 1 tablet (50 mg total) by mouth every 8 (eight) hours as needed for up to 5 days.  Dispense: 15 tablet; Refill: 0 - predniSONE (DELTASONE) 20 MG tablet; Take 1 tablet (20 mg total) by mouth daily with breakfast.  Dispense: 5 tablet; Refill: 0    Return if symptoms worsen or fail to improve.      I, Alfredia Ferguson, PA-C have reviewed all documentation for this visit. The documentation on  10/22/22   for the exam, diagnosis, procedures, and orders are all accurate and complete.    Alfredia Ferguson, PA-C  [facility] [phone] (phone) [phone] (fax)  [facility]

## 2022-10-25 ENCOUNTER — Telehealth: Payer: Self-pay | Admitting: Family Medicine

## 2022-10-25 DIAGNOSIS — M546 Pain in thoracic spine: Secondary | ICD-10-CM

## 2022-10-25 NOTE — Telephone Encounter (Signed)
Pt called & stated she was advised per Lillia Abed to call back & speak with her if her symptoms doesn't improve. She wants to know if here is another medication she can help to help her with her back pain. Please advise pt.

## 2022-10-28 ENCOUNTER — Other Ambulatory Visit (HOSPITAL_BASED_OUTPATIENT_CLINIC_OR_DEPARTMENT_OTHER): Payer: Self-pay

## 2022-10-28 MED ORDER — CYCLOBENZAPRINE HCL 5 MG PO TABS
5.0000 mg | ORAL_TABLET | Freq: Three times a day (TID) | ORAL | 1 refills | Status: DC | PRN
Start: 2022-10-28 — End: 2023-02-25
  Filled 2022-10-28: qty 30, 10d supply, fill #0
  Filled 2023-01-13: qty 30, 10d supply, fill #1

## 2022-10-28 NOTE — Telephone Encounter (Signed)
PT called back because she never heard back from our office on Friday. Pt is not feeling any better and the Tramadol is making her sick. Since Christina Larsen is not in the office this week, sending to patient's PCP. Please call her to advise if there is another medication she can take or what she needs to do next. Please call patient back to advise.

## 2022-10-28 NOTE — Telephone Encounter (Signed)
From patient's visit with Lillia Abed:   1. Acute bilateral thoracic back pain   Advised a short course of steroids and tramadol every 8 hours as needed for inflammation and pain relief.  Recommending gentle movement and stretching without pain, ice or heat.   If no improvement would try muscle relaxant and can always refer to physical therapy.   - traMADol (ULTRAM) 50 MG tablet; Take 1 tablet (50 mg total) by mouth every 8 (eight) hours as needed for up to 5 days.     Tried to call patient back to make sure she did take steroids. LVM for patient to call me back to discuss.

## 2022-10-28 NOTE — Telephone Encounter (Signed)
Spoke with patient, she states very little improvement in back pain but not as acute. She did finish steroids (5 days) but stopped tramadol because it made her feel lightheaded. She states back is still tight and had to only do light duty at work today. Per Lindsay's note she states if no better should do muscle relaxer and physical therapy. Patient is okay with both of these. She is aware not to drive while taking muscle relaxers. PT order pended. Please sign if appropriate and send RX to [facility].

## 2022-10-28 NOTE — Addendum Note (Signed)
Addended by: Hyman Hopes B on: 10/28/2022 04:29 PM   Modules accepted: Orders

## 2022-10-28 NOTE — Telephone Encounter (Signed)
Muscle relaxer added - advise to use sparingly; could make her drowsy. PT referral signed.

## 2022-10-28 NOTE — Addendum Note (Signed)
Addended bySilvio Pate on: 10/28/2022 04:24 PM   Modules accepted: Orders

## 2022-10-31 ENCOUNTER — Ambulatory Visit (INDEPENDENT_AMBULATORY_CARE_PROVIDER_SITE_OTHER): Payer: PPO | Admitting: Family Medicine

## 2022-10-31 ENCOUNTER — Encounter: Payer: Self-pay | Admitting: Family Medicine

## 2022-10-31 ENCOUNTER — Other Ambulatory Visit (HOSPITAL_BASED_OUTPATIENT_CLINIC_OR_DEPARTMENT_OTHER): Payer: Self-pay

## 2022-10-31 VITALS — BP 129/73 | HR 78 | Ht 65.0 in | Wt 155.0 lb

## 2022-10-31 DIAGNOSIS — Z7985 Long-term (current) use of injectable non-insulin antidiabetic drugs: Secondary | ICD-10-CM

## 2022-10-31 DIAGNOSIS — E1165 Type 2 diabetes mellitus with hyperglycemia: Secondary | ICD-10-CM

## 2022-10-31 DIAGNOSIS — E785 Hyperlipidemia, unspecified: Secondary | ICD-10-CM | POA: Diagnosis not present

## 2022-10-31 DIAGNOSIS — E119 Type 2 diabetes mellitus without complications: Secondary | ICD-10-CM | POA: Diagnosis not present

## 2022-10-31 DIAGNOSIS — M546 Pain in thoracic spine: Secondary | ICD-10-CM | POA: Diagnosis not present

## 2022-10-31 DIAGNOSIS — G629 Polyneuropathy, unspecified: Secondary | ICD-10-CM | POA: Diagnosis not present

## 2022-10-31 LAB — HEMOGLOBIN A1C: Hgb A1c MFr Bld: 7.8 % — ABNORMAL HIGH (ref 4.6–6.5)

## 2022-10-31 MED ORDER — GABAPENTIN 300 MG PO CAPS
300.0000 mg | ORAL_CAPSULE | Freq: Two times a day (BID) | ORAL | 3 refills | Status: DC | PRN
Start: 2022-10-31 — End: 2023-03-31
  Filled 2022-10-31: qty 30, 15d supply, fill #0
  Filled 2022-12-17: qty 30, 15d supply, fill #1
  Filled 2023-01-14 (×2): qty 30, 15d supply, fill #2
  Filled 2023-02-17: qty 30, 15d supply, fill #3

## 2022-10-31 NOTE — Progress Notes (Signed)
Established Patient Office Visit  Subjective   Patient ID: Christina Larsen, female    DOB: 1955-04-13  Age: 67 y.o. MRN: [id number]  Chief Complaint  Patient presents with   Medical Management of Chronic Issues    HPI  Patient is for routine follow-up. Reports she has been feeling well overall.   HYPERLIPIDEMIA - medications: Lipitor 80 mg daily, 1400 mg fish oil - compliance: good - medication SEs: no - Patient saw cardiology on 04/15/22 and they started her on Vascepa 1 g BID with instructions to repeat labs in 3 months and if triglycerides are still higher than 150, increase Vascepa to 2 g BID. Cost was going to be $200 which she could not afford. She contacted their office and they recommended getting 1400 mg fish oil instead and have [place] continue to monitor. Stable at lats check 3 months ago and she is scheduled to see them again next month. Defer recheck today. The ASCVD Risk score (Arnett DK, et al., 2019) failed to calculate for the following reasons:   The patient has a prior MI or stroke diagnosis   DIABETES: - Checking BG at home: rarely - Medications: Trulicity 4.5 mg weekly, metformin 1000 mg BID - Compliance: good - Eye exam: pt to schedule - Microalbumin: UTD - Denies symptoms of hypoglycemia, polyuria, polydipsia, numbness extremities, foot ulcers/trauma, wounds that are not healing, medication side effects  Lab Results  Component Value Date   HGBA1C 7.5 (H) 07/31/2022    CAD/NSTEMI: following with cardiology every 6 months - Medications: metoprolol succinate 50 mg daily, Plavix 75mg  daily - Compliance: good - Checking BP at home: not consistently  - Denies any SOB, recurrent headaches, CP, vision changes, LE edema, dizziness, palpitations, or medication side effects. - Diet: working on a healthy diet, cut back on carbs, increased veggies - Exercise: going to the gym a few times per week   NEUROPATHY: -She was taking PRN gabapentin for foot pain (neuropathy  originating from drop foot and chronic back pain). Her previous ortho provider was giving this to her, but he has moved away and she feels like it very well controlled on this regimen and asked me to continuing managing for her.    BACK PAIN: -She recently saw one of our other providers for back pain on 10/22/22. She was given prednisone, tramadol (didn't like), and flexeril. She is scheduled with PT, but not for 2 more weeks - she would like me to try to get her in somewhere else sooner. Reports mid-back is still very sore at times bilaterally (typically with activity, poor body mechanics). No sciatica or radiation of pain/numbness. Reports she recently moved so has bene doing a lot of lifting/pushing/pulling which may have been the initial trigger.      ROS All review of systems negative except what is listed in the HPI    Objective:     BP 129/73   Pulse 78   Ht 5\' 5"  (1.651 m)   Wt 155 lb (70.3 kg)   SpO2 97%   BMI 25.79 kg/m    Physical Exam Vitals reviewed.  Constitutional:      Appearance: Normal appearance.  Cardiovascular:     Rate and Rhythm: Normal rate and regular rhythm.     Pulses: Normal pulses.     Heart sounds: Normal heart sounds.  Pulmonary:     Effort: Pulmonary effort is normal.     Breath sounds: Normal breath sounds.  Skin:    General: Skin  is warm and dry.  Neurological:     Mental Status: She is alert and oriented to person, place, and time.  Psychiatric:        Mood and Affect: Mood normal.        Behavior: Behavior normal.        Thought Content: Thought content normal.        Judgment: Judgment normal.      No results found for any visits on 10/31/22.    The ASCVD Risk score (Arnett DK, et al., 2019) failed to calculate for the following reasons:   The patient has a prior MI or stroke diagnosis    Assessment & Plan:   Problem List Items Addressed This Visit     Type 2 diabetes mellitus (HCC) - Primary (Chronic)    Fairly  controlled with last A1c >7.5, recheck today  Continue Trulicity 4.5 mg weekly and metformin 1000 mg BID On Statin  Discussed diet and exercise      Relevant Orders   Hemoglobin A1c   Hyperlipidemia (Chronic)    -Medication management: continue lipitor 80 mg and 1400 mg fish oil daily -Repeat lipids in 2-3 months (just recently started fish oil) -Lifestyle factors for lowering cholesterol include: Diet therapy - heart-healthy diet rich in fruits, veggies, fiber-rich whole grains, lean meats, chicken, fish (at least twice a week), fat-free or 1% dairy products; foods low in saturated/trans fats, cholesterol, sodium, and sugar. Mediterranean diet has shown to be very heart healthy. Regular exercise - recommend at least 30 minutes a day, 5 times per week Weight management        Neuropathy    Gabapentin (primarily just needing at bedtime) helping with foot pain/neuropathy Refill provided       Relevant Medications   gabapentin (NEURONTIN) 300 MG capsule   Long-term current use of injectable noninsulin antidiabetic medication   Other Visit Diagnoses     Acute bilateral thoracic back pain     Continue PRN flexeril, heat, massage, home exercises (handout provided) Referral for quicker PT appointment placed Patient aware of signs/symptoms requiring further/urgent evaluation.    Relevant Orders   Ambulatory referral to Physical Therapy       Return in about 6 months (around 05/03/2023) for routine follow-up.    Clayborne Dana, NP

## 2022-10-31 NOTE — Assessment & Plan Note (Signed)
-  Medication management: continue lipitor 80 mg and 1400 mg fish oil daily -Repeat lipids in 2-3 months (just recently started fish oil) -Lifestyle factors for lowering cholesterol include: Diet therapy - heart-healthy diet rich in fruits, veggies, fiber-rich whole grains, lean meats, chicken, fish (at least twice a week), fat-free or 1% dairy products; foods low in saturated/trans fats, cholesterol, sodium, and sugar. Mediterranean diet has shown to be very heart healthy. Regular exercise - recommend at least 30 minutes a day, 5 times per week Weight management

## 2022-10-31 NOTE — Assessment & Plan Note (Signed)
Gabapentin (primarily just needing at bedtime) helping with foot pain/neuropathy Refill provided

## 2022-10-31 NOTE — Assessment & Plan Note (Signed)
Fairly controlled with last A1c >7.5, recheck today  Continue Trulicity 4.5 mg weekly and metformin 1000 mg BID On Statin  Discussed diet and exercise

## 2022-11-01 ENCOUNTER — Other Ambulatory Visit (HOSPITAL_BASED_OUTPATIENT_CLINIC_OR_DEPARTMENT_OTHER): Payer: Self-pay

## 2022-11-01 MED ORDER — EMPAGLIFLOZIN 10 MG PO TABS
10.0000 mg | ORAL_TABLET | Freq: Every day | ORAL | 3 refills | Status: DC
Start: 2022-11-01 — End: 2022-11-21
  Filled 2022-11-01: qty 30, 30d supply, fill #0

## 2022-11-05 ENCOUNTER — Ambulatory Visit (HOSPITAL_BASED_OUTPATIENT_CLINIC_OR_DEPARTMENT_OTHER)
Admission: RE | Admit: 2022-11-05 | Discharge: 2022-11-05 | Disposition: A | Discharge status: Home / Self Care | Payer: PPO | Source: Ambulatory Visit | Attending: Family Medicine | Admitting: Family Medicine

## 2022-11-05 ENCOUNTER — Other Ambulatory Visit: Payer: Self-pay | Admitting: Family Medicine

## 2022-11-05 DIAGNOSIS — M546 Pain in thoracic spine: Secondary | ICD-10-CM | POA: Insufficient documentation

## 2022-11-05 DIAGNOSIS — Z13820 Encounter for screening for osteoporosis: Secondary | ICD-10-CM

## 2022-11-05 DIAGNOSIS — M439 Deforming dorsopathy, unspecified: Secondary | ICD-10-CM

## 2022-11-05 DIAGNOSIS — R293 Abnormal posture: Secondary | ICD-10-CM | POA: Diagnosis not present

## 2022-11-05 DIAGNOSIS — R937 Abnormal findings on diagnostic imaging of other parts of musculoskeletal system: Secondary | ICD-10-CM

## 2022-11-05 DIAGNOSIS — E079 Disorder of thyroid, unspecified: Secondary | ICD-10-CM

## 2022-11-05 DIAGNOSIS — M549 Dorsalgia, unspecified: Secondary | ICD-10-CM | POA: Diagnosis not present

## 2022-11-05 NOTE — Progress Notes (Signed)
Spoke with patient's physical therapist. She is very point tender to thoracic spine palpation. Ordering xray for evaluation.

## 2022-11-12 ENCOUNTER — Encounter: Payer: Self-pay | Admitting: Family Medicine

## 2022-11-12 DIAGNOSIS — M546 Pain in thoracic spine: Secondary | ICD-10-CM | POA: Diagnosis not present

## 2022-11-12 DIAGNOSIS — R293 Abnormal posture: Secondary | ICD-10-CM | POA: Diagnosis not present

## 2022-11-12 NOTE — Addendum Note (Signed)
Addended by: Hyman Hopes B on: 11/12/2022 01:43 PM   Modules accepted: Orders

## 2022-11-12 NOTE — Addendum Note (Signed)
Addended bySilvio Pate on: 11/12/2022 02:32 PM   Modules accepted: Orders

## 2022-11-12 NOTE — Progress Notes (Signed)
Your xray shows a compression deformity at T8 that is new compared to your last imaging in 2022. We will start with an MRI to further evaluate if this is acutely new as that will change our plan. They will call you to schedule. In the meantime, continue physical therapy, make sure you are taking calcium and vitamin D (recommendation below). It would also be a good idea to get an updated bone density scan to check for signs of osteoporosis. I will go ahead and order this for you, if you are willing, you can schedule when imaging department calls you.   Calcium - Recommend a total of 1200 mg of calcium daily.  If you eat a very calcium rich diet you may be able to obtain that without a supplement.  If not, then I recommend calcium 500 mg twice a day.  There are several products over-the-counter such as Caltrate D and Viactiv chews which are great options that contain Calcium and vitamin D.  Vitamin D - Recommend 800 international units daily - sunlight for 15 minutes daily, supplements (with calcium or in a multivitamin). Food sources = seafood, eggs, organ meats, fish liver oils, vitamin D-fortified dairy products.

## 2022-11-13 DIAGNOSIS — M4309 Spondylolysis, multiple sites in spine: Secondary | ICD-10-CM | POA: Diagnosis not present

## 2022-11-13 DIAGNOSIS — M8588 Other specified disorders of bone density and structure, other site: Secondary | ICD-10-CM | POA: Diagnosis not present

## 2022-11-13 DIAGNOSIS — M546 Pain in thoracic spine: Secondary | ICD-10-CM | POA: Diagnosis not present

## 2022-11-13 DIAGNOSIS — E119 Type 2 diabetes mellitus without complications: Secondary | ICD-10-CM | POA: Diagnosis not present

## 2022-11-13 LAB — HM DEXA SCAN

## 2022-11-13 NOTE — Telephone Encounter (Signed)
I faxed order. See note about calcium.

## 2022-11-14 ENCOUNTER — Ambulatory Visit: Payer: PPO | Admitting: Physical Therapy

## 2022-11-14 DIAGNOSIS — M546 Pain in thoracic spine: Secondary | ICD-10-CM | POA: Diagnosis not present

## 2022-11-14 DIAGNOSIS — R293 Abnormal posture: Secondary | ICD-10-CM | POA: Diagnosis not present

## 2022-11-15 ENCOUNTER — Encounter: Payer: Self-pay | Admitting: Family Medicine

## 2022-11-17 ENCOUNTER — Encounter: Payer: Self-pay | Admitting: Family Medicine

## 2022-11-18 NOTE — Telephone Encounter (Signed)
Yes, see if she can come in.

## 2022-11-19 DIAGNOSIS — R293 Abnormal posture: Secondary | ICD-10-CM | POA: Diagnosis not present

## 2022-11-19 DIAGNOSIS — M546 Pain in thoracic spine: Secondary | ICD-10-CM | POA: Diagnosis not present

## 2022-11-21 ENCOUNTER — Ambulatory Visit (INDEPENDENT_AMBULATORY_CARE_PROVIDER_SITE_OTHER): Payer: PPO | Admitting: Family Medicine

## 2022-11-21 ENCOUNTER — Encounter: Payer: Self-pay | Admitting: Family Medicine

## 2022-11-21 ENCOUNTER — Other Ambulatory Visit (HOSPITAL_COMMUNITY)
Admission: RE | Admit: 2022-11-21 | Discharge: 2022-11-21 | Disposition: A | Discharge status: Home / Self Care | Payer: PPO | Source: Ambulatory Visit | Attending: Family Medicine | Admitting: Family Medicine

## 2022-11-21 VITALS — BP 121/73 | HR 76 | Ht 65.0 in | Wt 154.0 lb

## 2022-11-21 DIAGNOSIS — N898 Other specified noninflammatory disorders of vagina: Secondary | ICD-10-CM

## 2022-11-21 DIAGNOSIS — Z7984 Long term (current) use of oral hypoglycemic drugs: Secondary | ICD-10-CM | POA: Diagnosis not present

## 2022-11-21 DIAGNOSIS — Z7985 Long-term (current) use of injectable non-insulin antidiabetic drugs: Secondary | ICD-10-CM

## 2022-11-21 DIAGNOSIS — M546 Pain in thoracic spine: Secondary | ICD-10-CM | POA: Diagnosis not present

## 2022-11-21 DIAGNOSIS — E119 Type 2 diabetes mellitus without complications: Secondary | ICD-10-CM | POA: Diagnosis not present

## 2022-11-21 DIAGNOSIS — B9689 Other specified bacterial agents as the cause of diseases classified elsewhere: Secondary | ICD-10-CM | POA: Diagnosis not present

## 2022-11-21 DIAGNOSIS — R293 Abnormal posture: Secondary | ICD-10-CM | POA: Diagnosis not present

## 2022-11-21 DIAGNOSIS — N760 Acute vaginitis: Secondary | ICD-10-CM

## 2022-11-21 IMAGING — CR DG CHEST 2V
2 series · 2 of 2 positions shown · non-contrast
Comparison: January 16, 2020

CLINICAL DATA: Chest pain

EXAM:
CHEST - 2 VIEW

[w chest pa]
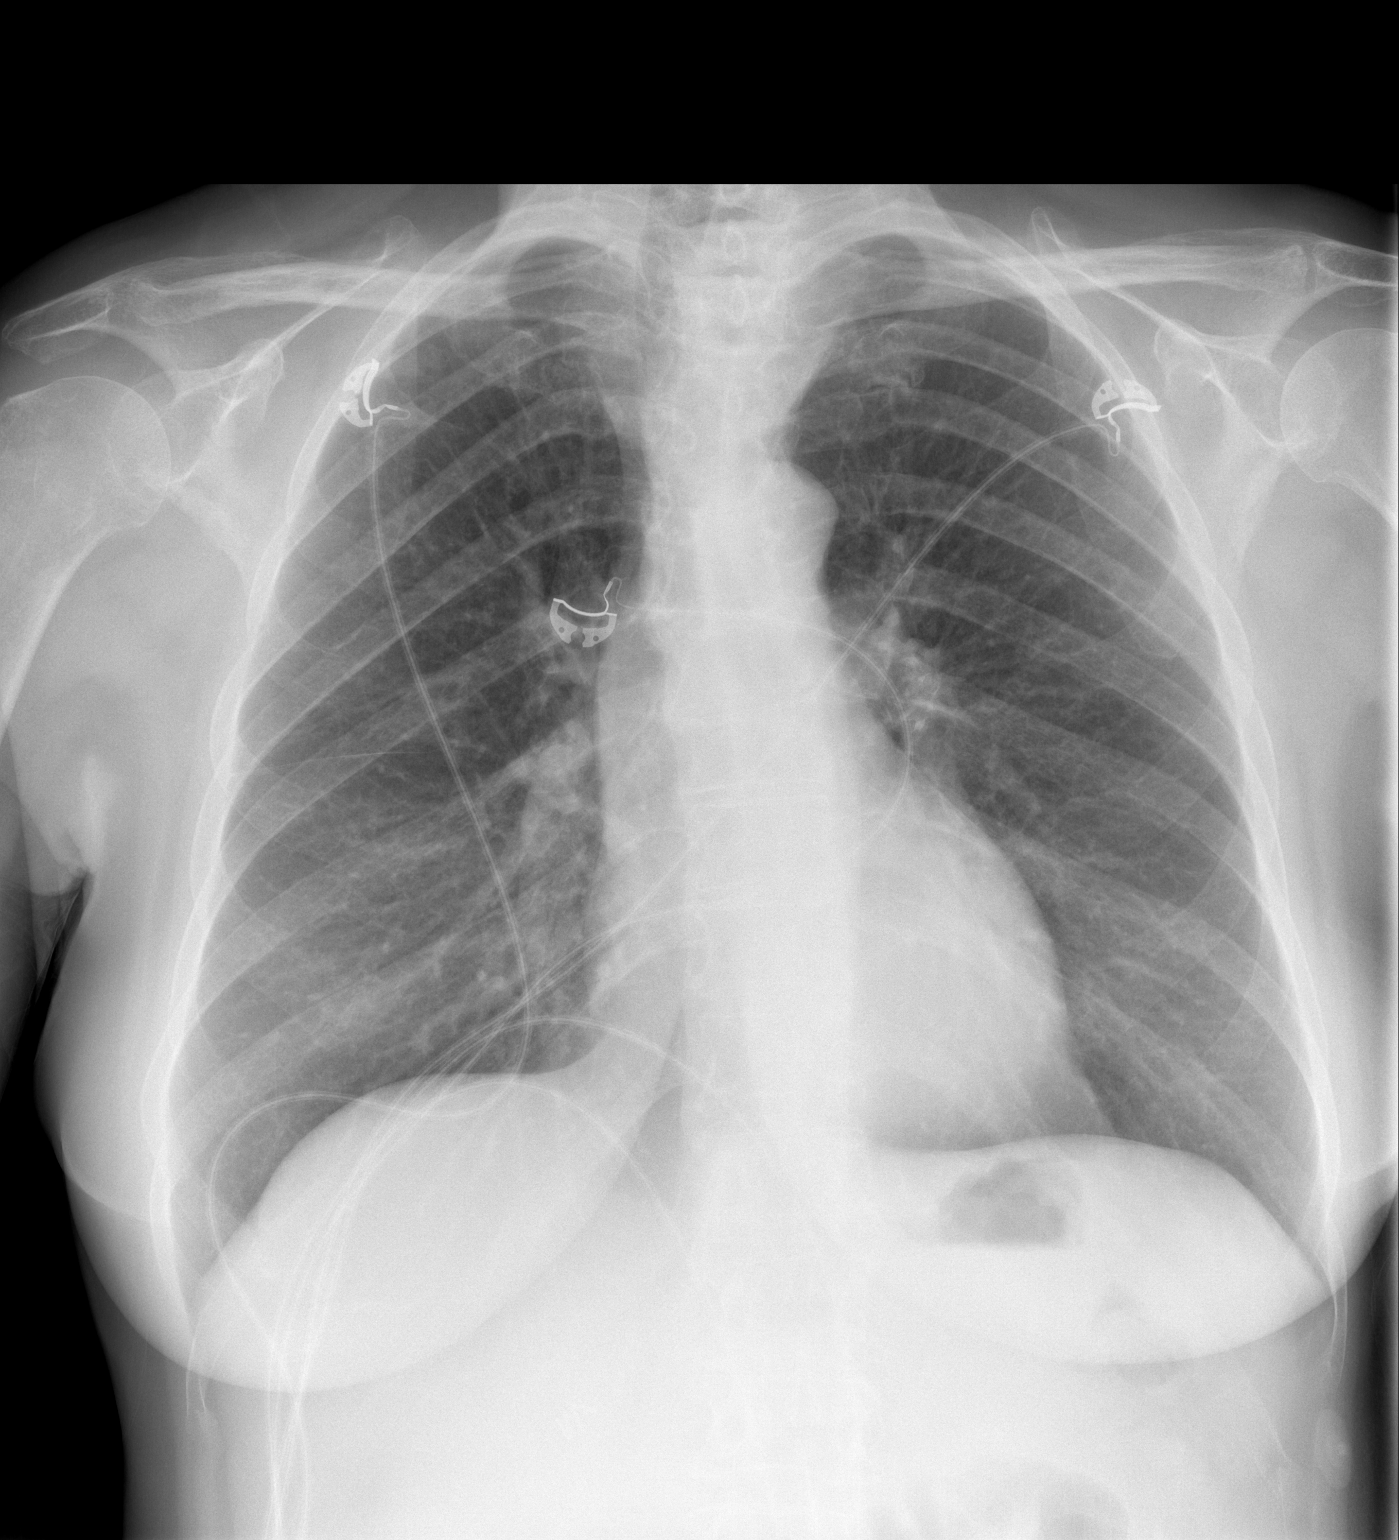

[w chest lat]
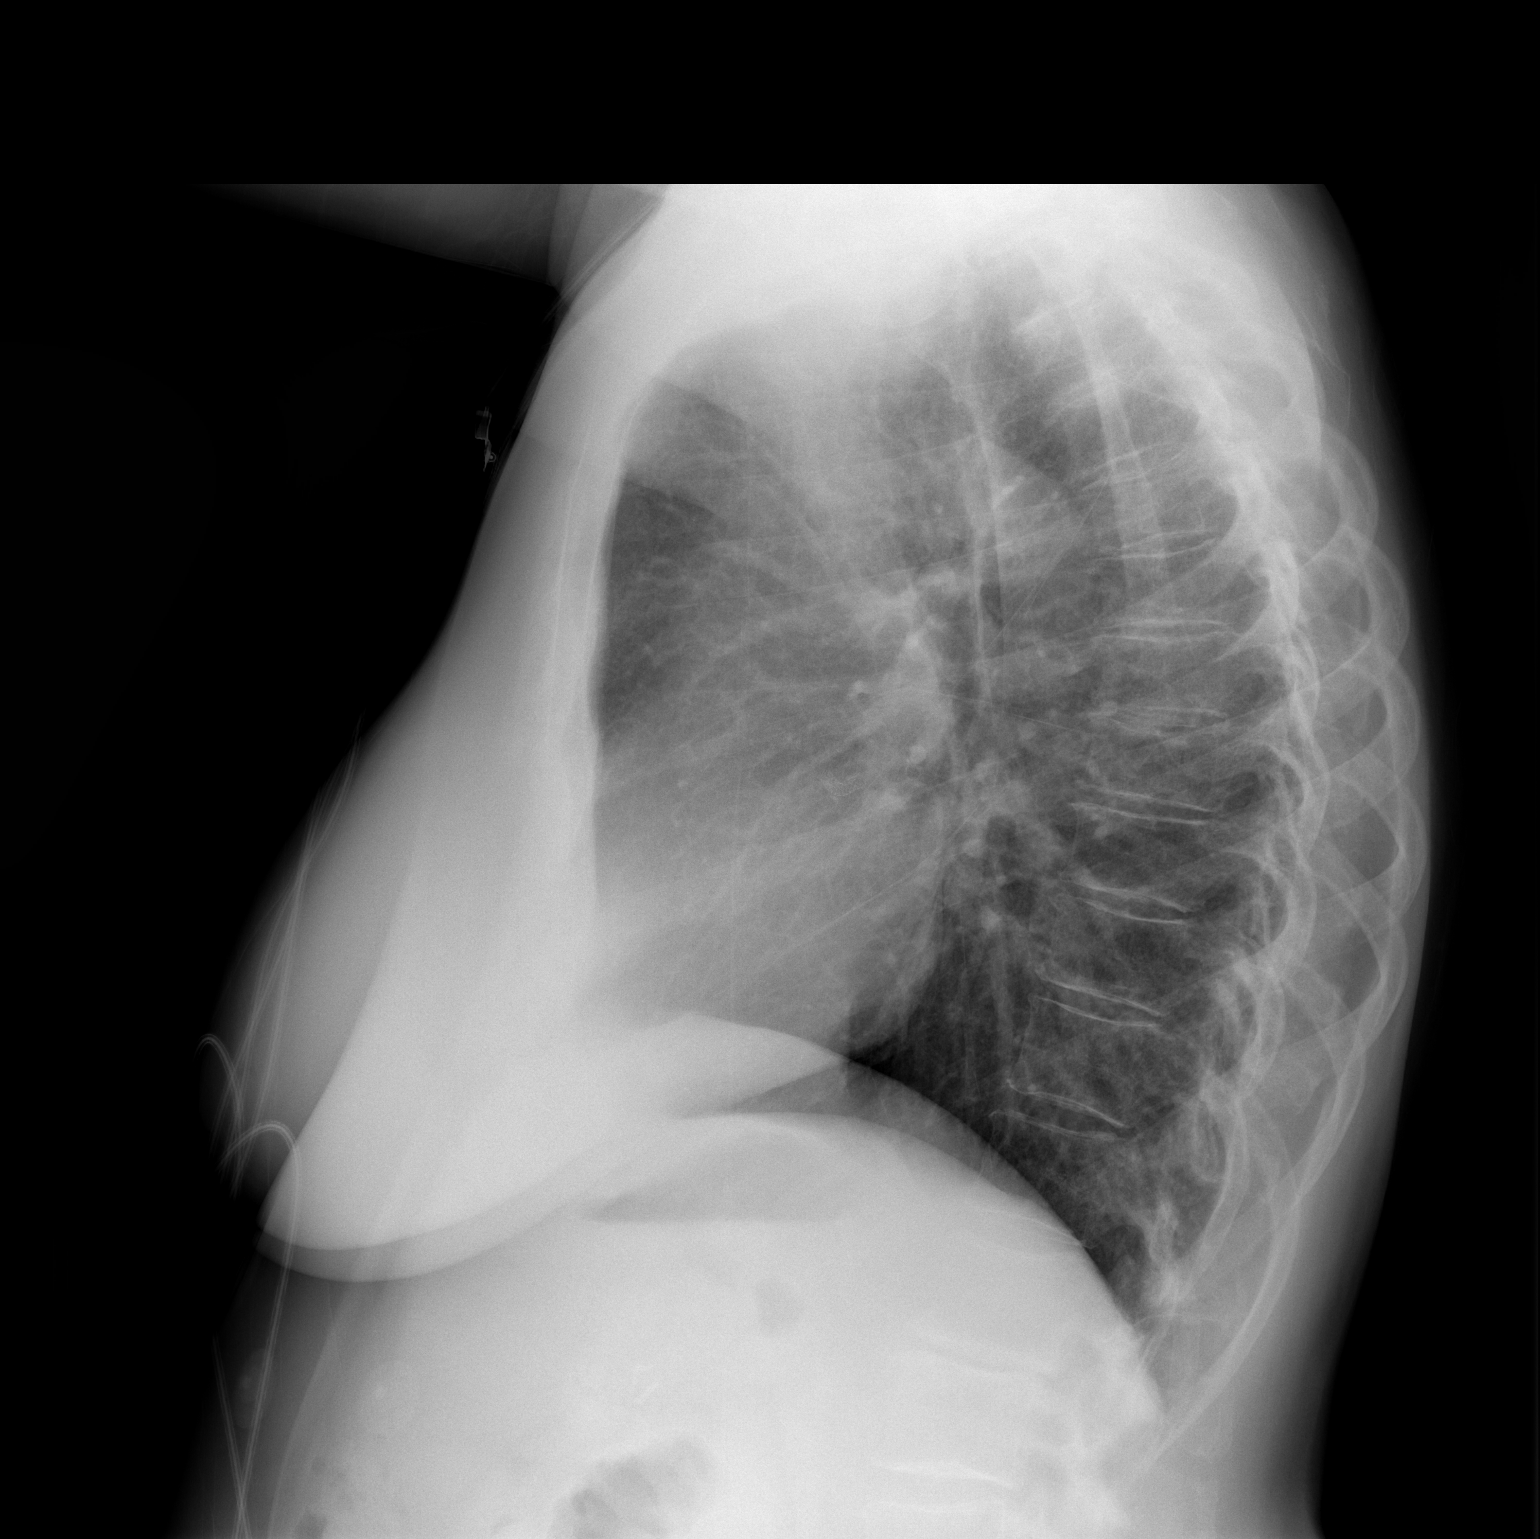

[2 of 2 positions shown; findings below may reference images not displayed]

FINDINGS: The cardiomediastinal silhouette is unchanged in contour. No pleural
effusion. No pneumothorax. No acute pleuroparenchymal abnormality.
Visualized abdomen is unremarkable. No acute osseous abnormality
noted.
IMPRESSION: No acute cardiopulmonary abnormality.

## 2022-11-21 NOTE — Assessment & Plan Note (Signed)
Patient did not like Jardiance A1c is only mildly above goal. We agreed to continue metformin 1000 mg BID, and Trulicity 4.5 mg /week with increased focus on lifestyle measures.  She will start monitoring glucose more closely at home and we will closely monitor A1c.

## 2022-11-21 NOTE — Progress Notes (Signed)
Acute Office Visit  Subjective:     Patient ID: Christina Larsen, female    DOB: 04-09-1955, 67 y.o.   MRN: [id number]  No chief complaint on file.   HPI Patient is in today for possible yeast infection.   Discussed the use of AI scribe software for clinical note transcription with the patient, who gave verbal consent to proceed.  History of Present Illness   The patient, with a history of back pain (MRI pending), reports persistent discomfort despite undergoing physical therapy. They describe the sensation as a deep, internal band-like pressure around their torso, which has led to sleep disturbances. The patient has been self-managing the discomfort with Flexeril, which they report as effective but causes drowsiness, limiting its use during work hours.  In addition to the back pain, the patient has been experiencing symptoms suggestive of a yeast infection following the initiation of Jardiance. They report itching and a purplish discoloration in the genital area, similar to a previous reaction to an unidentified medication. The patient has been self-treating with Monistat, which has provided some relief, but some discharge persists. She does not wish to continue Jardiance. She plans to focus on lifestyle measures.       ROS All review of systems negative except what is listed in the HPI      Objective:    BP 121/73   Pulse 76   Ht 5\' 5"  (1.651 m)   Wt 154 lb (69.9 kg)   SpO2 97%   BMI 25.63 kg/m    Physical Exam Vitals reviewed.  Constitutional:      Appearance: Normal appearance.  Neurological:     General: No focal deficit present.     Mental Status: She is alert and oriented to person, place, and time.  Psychiatric:        Mood and Affect: Mood normal.        Behavior: Behavior normal.        Thought Content: Thought content normal.        Judgment: Judgment normal.         No results found for any visits on 11/21/22.      Assessment & Plan:    Problem List Items Addressed This Visit       Active Problems   Type 2 diabetes mellitus (HCC) (Chronic)    Patient did not like Jardiance A1c is only mildly above goal. We agreed to continue metformin 1000 mg BID, and Trulicity 4.5 mg /week with increased focus on lifestyle measures.  She will start monitoring glucose more closely at home and we will closely monitor A1c.       Acute midline thoracic back pain    Patient was sent to PT a few weeks ago for generalized mid/low back pain (she had recently been moving to a new home). PT noted significant tenderness over thoracic spine. Xray was ordered.  Xray 11/05/22 =T8 compression deformity which is new compared to the 2022 study. MRI scheduled for later this month to assess chronicity and develop plan Continue PT and supportive measures, pain management. Consider OTC back brace, especially while at work.       Other Visit Diagnoses     Vaginal itching    -  Primary Symptoms mostly resolved with OTC yeast treatment Swab today and treat as indicated.  Patient aware of signs/symptoms requiring further/urgent evaluation.    Relevant Orders   Cervicovaginal ancillary only       No orders of the defined  types were placed in this encounter.   Return if symptoms worsen or fail to improve.  Clayborne Dana, NP

## 2022-11-21 NOTE — Assessment & Plan Note (Signed)
Patient was sent to PT a few weeks ago for generalized mid/low back pain (she had recently been moving to a new home). PT noted significant tenderness over thoracic spine. Xray was ordered.  Xray 11/05/22 =T8 compression deformity which is new compared to the 2022 study. MRI scheduled for later this month to assess chronicity and develop plan Continue PT and supportive measures, pain management. Consider OTC back brace, especially while at work.

## 2022-11-22 ENCOUNTER — Other Ambulatory Visit (HOSPITAL_BASED_OUTPATIENT_CLINIC_OR_DEPARTMENT_OTHER): Payer: Self-pay

## 2022-11-22 ENCOUNTER — Telehealth: Payer: Self-pay | Admitting: Family Medicine

## 2022-11-22 LAB — CERVICOVAGINAL ANCILLARY ONLY
Bacterial Vaginitis (gardnerella): POSITIVE — AB
Candida Glabrata: NEGATIVE
Candida Vaginitis: NEGATIVE
Comment: NEGATIVE
Comment: NEGATIVE
Comment: NEGATIVE

## 2022-11-22 MED ORDER — METRONIDAZOLE 500 MG PO TABS
500.0000 mg | ORAL_TABLET | Freq: Two times a day (BID) | ORAL | 0 refills | Status: AC
Start: 2022-11-22 — End: 2022-11-29
  Filled 2022-11-22: qty 14, 7d supply, fill #0

## 2022-11-22 NOTE — Telephone Encounter (Signed)
Copied from CRM [id number]. Topic: Medicare AWV >> Nov 22, 2022  3:38 PM Payton Doughty wrote: Reason for CRM: LM 11/21/2022 to schedule AWV   Verlee Rossetti; Care Guide Ambulatory Clinical Support [facility] Direct Dial: [phone]

## 2022-11-22 NOTE — Addendum Note (Signed)
Addended by: Hyman Hopes B on: 11/22/2022 12:27 PM   Modules accepted: Orders

## 2022-11-25 NOTE — Progress Notes (Unsigned)
Cardiology Office Note    Date:  11/26/2022   ID:  Prissy Dennin, DOB 20-Aug-1955, MRN [id number]  PCP:  Clayborne Dana, NP  Cardiologist:  [name], MD   7 month  F/U office visit  History of Present Illness:  Christina Larsen is a 67 y.o. female who is originally from [place].  She has a history of diabetes mellitus, hypertension, hyperlipidemia, and tobacco use.  She had undergone initial rotator cuff surgery in June 2021 and underwent a redo surgery at the end of October.  While watching [facility], she had developed significant chest pain and presented to [facility] where ECG showed anteroseptal ST depression.  Troponin was elevated to 85 > 860.  She was given aspirin and started on heparin and nitroglycerin.  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  She required aspiration thrombectomy for significant clot burden and underwent successful stenting of her proximal circumflex vessel.  There was distal circumflex disease treated medically.  I had seen her during her hospitalization.  We had a lengthy discussion regarding absolute smoking cessation and since her presentation she has not had any further cigarettes.  She has been undergoing physical therapy for her redo shoulder surgery.  She was seen for initial post hospital follow-up on November 30 by Lynnae Sandhoff, NP.  I saw her for my initial office evaluation on March 14, 2020.  At that time she was feeling well and denied recurrent chest pain symptomatology and was unaware of palpitations.  She continued to be on aspirin and Brilinta for DAPT for minimum of 1 year, and is on metoprolol tartrate 25 mg twice a day.  She was tolerating  atorvastatin 80 mg daily and was on metformin and Trulicity for her diabetes mellitus.  She has peripheral neuropathy and is on gabapentin.    She presented to the emergency room on May 07, 2020 with complaints of intermittent chest tightness.  Her chest pain was intermittent and not exertionally precipitated.  Troponins were negative.  Chest x-ray did not reveal acute abnormalities.  The chest pain was a stabbing pain to the right shoulder and was not felt to be ischemic in etiology.  She was subsequently evaluated by Edd Fabian on May 09, 2020 and remained stable.  I saw her on Jul 24, 2020.  On May 12 while walking her dog she had an episode of vertigo.  She was unaware of palpitations.  She denied any recurrent chest pain symptoms or exertional dyspnea.  She admits to poor sleep.  She also has experienced fatigue and daytime sleepiness.  An Epworth Sleepiness Scale score was therefore calculated in the office today and this endorsed at 16 consistent with excessive daytime sleepiness. She completed cardiac rehab.  She is now participating in Silver sneaker program at the gym.  During her evaluation, I felt she was stable with reference to CAD.  I raise concern for possible sleep apnea which may be contributing to her significant excessive daytime sleepiness and with her poor sleep and cardiovascular comorbidities a sleep study was recommended.  She underwent a diagnostic polysomnogram in October 16, 2020.  Her AHI was 0 but she did have increased respiratory disturbance index of 12.5.  There was no significant oxygen desaturation.  She had moderate snoring throughout the study.  He was felt most likely that she had increased upper airway resistance syndrome.  She was also noted to have periodic limb movement during sleep with associated arousals with an index of 10.5/h.  I saw on November 02, 2020.  At that time she was continuing to go to the  Silver sneakers program.  She denied any recurrent chest  pain.  She continues to be on DAPT with aspirin/Brilinta she is on atorvastatin 80 mg for aggressive lipid-lowering therapy.  She is diabetic on metformin and Trulicity.  She has neuropathy on gabapentin.  Remotely, she did try medication in 2006 for restless legs.    I saw her on April 19, 2021 at which time she remained stable and was without anginal symptomatology.  She denied any shortness of breath.  Fortunately she has stayed off tobacco since her presentation in November 2021.  At that time, she was wondering about longevity of continuing Brilinta due to cost and was subsequently transitioned to clopidogrel.  She continued to be on atorvastatin 80 mg for hyperlipidemia.  She was on metoprolol 25 mg twice a day.  At that time I suggested metoprolol to tartrate be discontinued and she was switched to metoprolol succinate 50 mg daily.  With her diabetes she was on metformin in addition to Trulicity.  She was seeing Dr. Jacky Kindle for primary care who was checking laboratory.  I saw her on November 02, 2021. Since her prior evaluation, she has switched her primary care provider to Hyman Hopes, NP at [facility] primary care.  Presently, she continues to be stable and denies any exertional chest pain.  At times she notes an occasional musculoskeletal twinge.  She goes to the gym at least 2-3 times per week where she rides a bike and walks on the treadmill.  She continues to work.  She was recently started on over-the-counter fish oil for elevated triglycerides and has been trying to improve her diet.    I last saw her on April 15, 2022.  At that time she remained stable from a cardiovascular standpoint.   She had hurt her back and as result has not been exercising as much as she had in the past.  She had developed an episode of shingles on her head which ultimately resolved and she just recently received an initial shingles vaccination.  She continues to be followed by Hyman Hopes, NP for primary care.  Most  recent laboratory from January 24, 2022 showed an elevated glucose at 186 and hemoglobin A1c at 9.3.  She is on metformin 1000 g twice a day and her dose of Trulicity was doubled to current dose of 3 mg weekly.  Lipid studies revealed total cholesterol 135, triglycerides 191, HDL 52, VLDL 38, and LDL 45.  Renal function is normal with creatinine 0.74.  She continues to be on atorvastatin 80 mg daily for hyperlipidemia.  She has continued long-term DAPT with aspirin/Plavix.  She takes Neurontin for neuropathy.  She continues to be on metoprolol succinate 50 mg daily for blood pressure and CAD.    Since I last saw her, Ms. Cadorette has moved.  She was carrying boxes for several days in a row and started to develop some back discomfort.  She subsequently has been diagnosed with a compression fracture at T8 and underwent an MRI today.  She is followed by Hyman Hopes, NP for primary care.  She did not tolerate  tramadol.  Currently she is taking Flexeril with some improvement.  She has been diagnosed with osteopenia in her left arm and also has degenerative spine changes at L2.  Presently, she denies any chest pain or shortness of breath.  She is unaware of any arrhythmia.  She continues to be on clopidogrel 75 mg daily for antiplatelet therapy.  She is on atorvastatin 80 mg daily for hyperlipidemia.  She has continued to be on metoprolol succinate 50 mg daily.  She is on Trulicity 4.5 mg injection weekly with her diabetes mellitus in addition to her metformin.  She has neuropathy on gabapentin.  She presents for evaluation  Past Medical History:  Diagnosis Date   Allergy    Coronary artery disease    11/9 NSTEMI, PCI to pLCx with aspiration thrombectomy/DESx1, occluded mRCA with left to right collaterals   Diabetes mellitus without complication (HCC)    HLD (hyperlipidemia)    Hypertension    Myocardial infarction [facility])     Past Surgical History:  Procedure Laterality Date   CARDIAC CATHETERIZATION      CHOLECYSTECTOMY  1992   CORONARY STENT INTERVENTION N/A 01/17/2020   Procedure: CORONARY STENT INTERVENTION;  Surgeon: Yvonne Kendall, MD;  Location: [facility] INVASIVE CV LAB;  Service: Cardiovascular;  Laterality: N/A;   LAMINECTOMY  2006   LEFT HEART CATH AND CORONARY ANGIOGRAPHY N/A 01/17/2020   Procedure: LEFT HEART CATH AND CORONARY ANGIOGRAPHY;  Surgeon: Yvonne Kendall, MD;  Location: [facility] INVASIVE CV LAB;  Service: Cardiovascular;  Laterality: N/A;   rotator cuff surgery      Current Medications: Outpatient Medications Prior to Visit  Medication Sig Dispense Refill   atorvastatin (LIPITOR) 80 MG tablet Take 1 tablet (80 mg total) by mouth daily. 90 tablet 3   cholecalciferol (VITAMIN D3) 25 MCG (1000 UNIT) tablet Take 1,000 Units by mouth daily.     clopidogrel (PLAVIX) 75 MG tablet Take 1 tablet (75 mg total) by mouth daily. 90 tablet 3   cyclobenzaprine (FLEXERIL) 5 MG tablet Take 1 tablet (5 mg total) by mouth 3 (three) times daily as needed for muscle spasms. 30 tablet 1   Dulaglutide (TRULICITY) 4.5 MG/0.5ML SOPN Inject 4.5 mg as directed once a week. 6 mL PRN   gabapentin (NEURONTIN) 300 MG capsule Take 1 capsule (300 mg total) by mouth every 12 (twelve) hours as needed. 30 capsule 3   metFORMIN (GLUCOPHAGE) 1000 MG tablet Take 1 tablet (1,000 mg total) by mouth in the morning and at bedtime. 180 tablet 3   metoprolol succinate (TOPROL XL) 50 MG 24 hr tablet Take 1 tablet (50 mg total) by mouth daily. 90 tablet 3   metroNIDAZOLE (FLAGYL) 500 MG tablet Take 1 tablet (500 mg total) by mouth 2 (two) times daily for 7 days. 14 tablet 0   Multiple Vitamin (MULTIVITAMIN) tablet Take 1 tablet by mouth daily.     nitroGLYCERIN (NITROSTAT) 0.4 MG SL tablet Place 1 tablet (0.4 mg total) under the tongue every 5 (five) minutes x 3 doses as needed. Call 911 after 3rd dose if no relief 25 tablet 2   No facility-administered medications prior to visit.     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  Tobacco  Use   Smoking status: Former    Current packs/day: 0.00    Types: Cigarettes    Quit date: 01/16/2020    Years since quitting: 2.8   Smokeless tobacco: Never  Vaping Use   Vaping status: Never Used  Substance and Sexual Activity   Alcohol use: Yes    Alcohol/week: 4.0 - 6.0 standard drinks of alcohol    Types: 3 - 4 Glasses of wine, 1 - 2 Shots of liquor per week   Drug use: Never   Sexual activity: Not Currently  Other Topics Concern   Not on file  Social History Narrative   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   Financial Resource Strain: Low Risk  (10/22/2022)   Overall Financial Resource Strain (CARDIA)    Difficulty of Paying Living Expenses: Not very hard  Food Insecurity: Unknown (10/22/2022)   Hunger Vital Sign    Worried About Running Out of Food in the Last Year: Patient declined    Ran Out of Food in the Last Year: Never true  Transportation Needs: No Transportation Needs (10/22/2022)   PRAPARE - Administrator, Civil Service (Medical): No    Lack of Transportation (Non-Medical): No  Physical Activity: Insufficiently Active (10/22/2022)   Exercise Vital Sign    Days of Exercise per Week: 2 days    Minutes of Exercise per Session: 20 min  Stress: Stress Concern Present (10/22/2022)   [employer] of Occupational Health - Occupational Stress Questionnaire    Feeling of Stress : To some extent  Social Connections: Unknown (10/22/2022)   Social Connection and Isolation Panel [NHANES]    Frequency of Communication with Friends and Family: Three times a week    Frequency of Social Gatherings with Friends and Family: Twice a week    Attends Religious Services: Patient declined     Database administrator or Organizations: No    Attends Engineer, structural: Not on file    Marital Status: Widowed    Socially she lives by herself.  She quit smoking the day of her heart attack.  She had been divorced from her husband who subsequently has died.  She has children.  Family History:  The patient's  family history includes Arthritis in her mother; Cancer in her brother; Colon cancer in her mother; Diabetes in her brother and mother; Heart disease in her father; Hypercholesterolemia in her father; Hyperlipidemia in her brother; Hypertension in her brother; Prostate cancer in her father.   ROS General: Negative; No fevers, chills, or night sweats;  HEENT: Negative; No changes in vision or hearing, sinus congestion, difficulty swallowing Pulmonary: Negative; No cough, wheezing, shortness of breath, hemoptysis Cardiovascular: See HPI GI: Negative; No nausea, vomiting, diarrhea, or abdominal pain GU: Negative; No dysuria, hematuria, or difficulty voiding Musculoskeletal: Negative; no myalgias, joint pain, or weakness Hematologic/Oncology: Negative; no easy bruising, bleeding Endocrine: Negative; no heat/cold intolerance; no diabetes Neuro: Negative; no changes in balance, headaches Skin: Negative; No rashes or skin lesions Psychiatric: Negative; No behavioral problems, depression Sleep: Poor sleep, fatigue, daytime sleepiness, snoring; no bruxism, restless legs, hypnogognic hallucinations, no cataplexy Other comprehensive 14 point system review is negative.   PHYSICAL EXAM:   VS:  BP 128/72   Pulse 65   Ht 5\' 5"  (1.651 m)   Wt 157 lb 9.6 oz (71.5 kg)   SpO2 96%   BMI 26.23 kg/m     Repeat blood pressure by me was 134/76  Wt Readings  from Last 3 Encounters:  11/26/22 157 lb 9.6 oz (71.5 kg)  11/21/22 154 lb (69.9 kg)  10/31/22 155 lb (70.3 kg)    General: Alert, oriented, no distress.  Skin: normal turgor, no rashes, warm and dry HEENT: Normocephalic,  atraumatic. Pupils equal round and reactive to light; sclera anicteric; extraocular muscles intact;  Nose without nasal septal hypertrophy Mouth/Parynx benign; Mallinpatti scale 3 Neck: No JVD, no carotid bruits; normal carotid upstroke Lungs: clear to ausculatation and percussion; no wheezing or rales Chest wall: without tenderness to palpitation Heart: PMI not displaced, RRR, s1 s2 normal, 1/6 systolic murmur, no diastolic murmur, no rubs, gallops, thrills, or heaves Abdomen: soft, nontender; no hepatosplenomehaly, BS+; abdominal aorta nontender and not dilated by palpation. Back: no CVA tenderness Pulses 2+ Musculoskeletal: full range of motion, normal strength, no joint deformities Extremities: no clubbing cyanosis or edema, Homan's sign negative  Neurologic: grossly nonfocal; Cranial nerves grossly wnl Psychologic: Normal mood and affect   Studies/Labs Reviewed:   EKG Interpretation Date/Time:  Tuesday November 26 2022 08:25:03 EDT Ventricular Rate:  75 PR Interval:  160 QRS Duration:  102 QT Interval:  414 QTC Calculation: 462 R Axis:   -39  Text Interpretation: Normal sinus rhythm Left axis deviation Low voltage QRS Incomplete right bundle branch block When compared with ECG of 07-May-2020 10:03, No significant change was found Confirmed by [name] ([id number]) on 11/26/2022 8:54:37 AM    April 15, 2022 ECG (independently read by me): Low atrial rhythm at 75, no ST changes, normal intervals   November 02, 2021 ECG (independently read by me): NSR at 73, LAHB  April 19, 2021 ECG (independently read by me):  NSR at 78, IRBBB, no ectopy  November 02, 2020 ECG (independently read by me): Normal sinus rhythm at 72 bpm, left axis deviation.  No ectopy.  No ST segment changes.  Jul 24, 2020 ECG (independently read by me): NSR at 72; Left axis deviation  January 2022 ECG (independently read by me): Normal sinus rhythm at 68 bpm.  Left axis deviation.  Nondiagnostic T wave  abnormality.  Recent Labs:    Latest Ref Rng & Units 07/31/2022    7:23 AM 01/24/2022    8:34 AM 10/08/2021    7:11 AM  BMP  Glucose 70 - 99 mg/dL 604  540  981   BUN 6 - 23 mg/dL 15  14  12    Creatinine 0.40 - 1.20 mg/dL 1.91  4.78  2.95   Sodium 135 - 145 mEq/L 140  139  140   Potassium 3.5 - 5.1 mEq/L 4.4  4.4  4.8   Chloride 96 - 112 mEq/L 103  102  104   CO2 19 - 32 mEq/L 28  28  28    Calcium 8.4 - 10.5 mg/dL 9.8  9.8  9.1         Latest Ref Rng & Units 07/31/2022    7:23 AM 01/24/2022    8:34 AM 10/08/2021    7:11 AM  Hepatic Function  Total Protein 6.0 - 8.3 g/dL 6.9  7.5  6.7   Albumin 3.5 - 5.2 g/dL 4.3  4.6  4.1   AST 0 - 37 U/L 17  18  16    ALT 0 - 35 U/L 20  25  25    Alk Phosphatase 39 - 117 U/L 64  74  67   Total Bilirubin 0.2 - 1.2 mg/dL 0.8  0.8  0.8  Latest Ref Rng & Units 10/08/2021    7:11 AM 11/01/2020   12:00 AM 05/07/2020   10:39 AM  CBC  WBC 4.0 - 10.5 K/uL 7.2  8.6     9.4   Hemoglobin 12.0 - 15.0 g/dL 16.1  09.6     04.5   Hematocrit 36.0 - 46.0 % 42.6  46     44.1   Platelets 150.0 - 400.0 K/uL 192.0  225     222      This result is from an external source.   Lab Results  Component Value Date   MCV 92.6 10/08/2021   MCV 94.0 05/07/2020   MCV 91 02/08/2020   Lab Results  Component Value Date   TSH 0.76 10/08/2021   Lab Results  Component Value Date   HGBA1C 7.8 (H) 10/31/2022     BNP No results found for: "BNP"  ProBNP No results found for: "PROBNP"   Lipid Panel     Component Value Date/Time   CHOL 103 07/31/2022 0723   CHOL 114 04/20/2021 0811   TRIG 143.0 07/31/2022 0723   HDL 44.80 07/31/2022 0723   HDL 47 04/20/2021 0811   CHOLHDL 2 07/31/2022 0723   VLDL 28.6 07/31/2022 0723   LDLCALC 30 07/31/2022 0723   LDLCALC 38 04/20/2021 0811   LABVLDL 29 04/20/2021 0811     RADIOLOGY: DG Thoracic Spine 2 View  Result Date: 11/12/2022 CLINICAL DATA:  Increasing back pain. EXAM: THORACIC SPINE 3 VIEWS COMPARISON:   Chest x-ray 05/07/2020 FINDINGS: New T8 anterior wedge compression deformity noted. No spondylolisthesis. No osteolytic or osteoblastic changes. Thoracic disc spaces are maintained. IMPRESSION: T8 compression deformity which is new compared to the 2022 study. The chronicity of this finding can be evaluated with MRI, if indicated. Electronically Signed   By: Layla Maw M.D.   On: 11/12/2022 12:40     Additional studies/ records that were reviewed today include:   01/17/2020 Conclusions: Significant two-vessel coronary artery disease with occlusions of the proximal LCx (acute with heavy thrombus burden) and mid RCA (chronic with left-to-right collaterals).  Moderate, non-obstructive coronary artery disease is also present in the ramus intermedius and distal LCx (codominant vessel). Mid LAD myocardial bridge. Mildly elevated left ventricular filling pressure with lateral wall hypokinesis and mildly reduced contraction. Successful PCI to proximal LCx with aspiration thrombectomy and placement of Resolute Onyx 3.0 x 18 mm drug-eluting stent (postdilated to 3.3 mm) with 0% residual stenosis and TIMI-3 flow.   Recommendations: Dual antiplatelet therapy with aspirin and ticagrelor for at least 12 months. Aggressive secondary prevention, including high-intensity statin therapy and smoking cessation. Medical management of chronic total occlusion of mid RCA as well as moderate ramus/distal LCx disease. Remove right femoral artery sheath with manual compression two hours after discontinuation of bivalirudin  Intervention      ECHO IMPRESSIONS   1. Left ventricular ejection fraction, by estimation, is 55%. The left  ventricle has normal function. The left ventricle has no regional wall  motion abnormalities. Left ventricular diastolic parameters were normal.   2. Right ventricular systolic function is normal. The right ventricular  size is normal.   3. The mitral valve is normal in structure.  No evidence of mitral valve  regurgitation. No evidence of mitral stenosis.   4. The aortic valve is normal in structure. Aortic valve regurgitation is  not visualized. Mild aortic valve sclerosis is present, with no evidence  of aortic valve stenosis.   5. Aortic dilatation noted.  There is mild dilatation of the ascending  aorta, measuring 40 mm.   6. The inferior vena cava is normal in size with greater than 50%  respiratory variability, suggesting right atrial pressure of 3 mmHg.    ASSESSMENT:    1. Non-ST elevation (NSTEMI) myocardial infarction [facility]): November 2021   2. Mixed hyperlipidemia   3. Essential hypertension   4. Hyperlipidemia with target LDL less than 55   5. Type 2 diabetes mellitus with complication, without long-term current use of insulin (HCC)   6. UARS (upper airway resistance syndrome)     PLAN:  Christina Larsen is a very pleasant 67 year old female who is originally from [place].  She has a history of previous significant tobacco use, hypertension, diabetes mellitus, as well as hyperlipidemia.  She developed an acute coronary syndrome 1 week following her redo right shoulder surgery and was found to have thrombotic occlusion of her proximal circumflex in November 2021.  Catheterization also revealed a chronic total occlusion of RCA which was well collateralized via septal perforators arising from the LAD.  She also had mild concomitant CAD in her LAD, moderate in the ramus intermediate, and in her proximal RCA.  She has not had any recurrent anginal symptomatology and underwent successful DES stenting after thrombectomy of her proximal circumflex.  Since her initial post hospital office visit with me, she  presented to the emergency room in February 2022 with atypical chest pain that was described as a stabbing sharp discomfort radiating to her right shoulder.  This most likely was musculoskeletal in etiology.  She has not had recurrent symptoms and has continued to  be on DAPT following her STEMI and is tolerating this well.  She was treated with aspirin/Brilinta which she tolerated well but at her last office visit in February 2023 due to cost issues she was transition to generic clopidogrel.  Presently, she continues to be stable and denies any recurrent anginal symptomatology.  Tolerating metoprolol succinate 50 mg daily.  ECG and heart rate today are stable.  At her last evaluation, I initiated 3 fatty acid to aid with her elevated triglycerides.  I discussed Vascepa but due to cost issues she ultimately was started on Natures Bounty 1400 mg omega-3 fatty acid which 980 mg consists of EPA/DHA.  Her most recent lipid studies were significantly improved with triglycerides down to 143.  LDL cholesterol was excellent at 30 with VLDL 28.6 and HDL 44.8 on Jul 31, 2022.  Primary provider would like her to initiate calcium supplementation in light of her osteopenia.  She underwent an MRI today regarding her compression fracture at T8 we will be following up with Ladona Ridgel back, NP.  Her blood pressure today remains stable on current therapy she is tolerating clopidogrel.  She continues to be on metformin in addition to Trulicity.  I will see her in 6 months for follow-up evaluation or sooner as needed.    Medication Adjustments/Labs and Tests Ordered: Current medicines are reviewed at length with the patient today.  Concerns regarding medicines are outlined above.  Medication changes, Labs and Tests ordered today are listed in the Patient Instructions below. Patient Instructions  Medication Instructions:  Your physician recommends that you continue on your current medications as directed. Please refer to the Current Medication list given to you today.  *If you need a refill on your cardiac medications before your next appointment, please call your pharmacy*  Lab Work: If you have labs (blood work) drawn today and your tests  are completely normal, you will receive your  results only by: MyChart Message (if you have MyChart) OR A paper copy in the mail If you have any lab test that is abnormal or we need to change your treatment, we will call you to review the results.  Testing/Procedures: None ordered today.  Follow-Up: At [facility], you and your health needs are our priority.  As part of our continuing mission to provide you with exceptional heart care, we have created designated Provider Care Teams.  These Care Teams include your primary Cardiologist (physician) and Advanced Practice Providers (APPs -  Physician Assistants and Nurse Practitioners) who all work together to provide you with the care you need, when you need it.  We recommend signing up for the patient portal called "MyChart".  Sign up information is provided on this After Visit Summary.  MyChart is used to connect with patients for Virtual Visits (Telemedicine).  Patients are able to view lab/test results, encounter notes, upcoming appointments, etc.  Non-urgent messages can be sent to your provider as well.   To learn more about what you can do with MyChart, go to [email or web address].    Your next appointment:   6 month(s)  Provider:   [name], MD       Signed, [name], MD  11/26/2022 12:33 PM    [facility] [address], [place], [place]  [address] Phone: [phone]

## 2022-11-26 ENCOUNTER — Ambulatory Visit
Admission: RE | Admit: 2022-11-26 | Discharge: 2022-11-26 | Disposition: A | Discharge status: Home / Self Care | Payer: PPO | Source: Ambulatory Visit | Attending: Family Medicine | Admitting: Family Medicine

## 2022-11-26 ENCOUNTER — Encounter: Payer: Self-pay | Admitting: Cardiovascular Disease

## 2022-11-26 ENCOUNTER — Ambulatory Visit: Payer: PPO | Attending: Cardiovascular Disease | Admitting: Cardiovascular Disease

## 2022-11-26 VITALS — BP 128/72 | HR 65 | Ht 65.0 in | Wt 157.6 lb

## 2022-11-26 DIAGNOSIS — R293 Abnormal posture: Secondary | ICD-10-CM | POA: Diagnosis not present

## 2022-11-26 DIAGNOSIS — M47814 Spondylosis without myelopathy or radiculopathy, thoracic region: Secondary | ICD-10-CM | POA: Diagnosis not present

## 2022-11-26 DIAGNOSIS — I10 Essential (primary) hypertension: Secondary | ICD-10-CM | POA: Diagnosis not present

## 2022-11-26 DIAGNOSIS — G478 Other sleep disorders: Secondary | ICD-10-CM | POA: Diagnosis not present

## 2022-11-26 DIAGNOSIS — M5124 Other intervertebral disc displacement, thoracic region: Secondary | ICD-10-CM | POA: Diagnosis not present

## 2022-11-26 DIAGNOSIS — E118 Type 2 diabetes mellitus with unspecified complications: Secondary | ICD-10-CM

## 2022-11-26 DIAGNOSIS — E782 Mixed hyperlipidemia: Secondary | ICD-10-CM

## 2022-11-26 DIAGNOSIS — M546 Pain in thoracic spine: Secondary | ICD-10-CM | POA: Diagnosis not present

## 2022-11-26 DIAGNOSIS — I214 Non-ST elevation (NSTEMI) myocardial infarction: Secondary | ICD-10-CM | POA: Diagnosis not present

## 2022-11-26 DIAGNOSIS — E785 Hyperlipidemia, unspecified: Secondary | ICD-10-CM | POA: Diagnosis not present

## 2022-11-26 DIAGNOSIS — M4854XA Collapsed vertebra, not elsewhere classified, thoracic region, initial encounter for fracture: Secondary | ICD-10-CM | POA: Diagnosis not present

## 2022-11-26 NOTE — Patient Instructions (Signed)
Medication Instructions:  Your physician recommends that you continue on your current medications as directed. Please refer to the Current Medication list given to you today.  *If you need a refill on your cardiac medications before your next appointment, please call your pharmacy*  Lab Work: If you have labs (blood work) drawn today and your tests are completely normal, you will receive your results only by: MyChart Message (if you have MyChart) OR A paper copy in the mail If you have any lab test that is abnormal or we need to change your treatment, we will call you to review the results.  Testing/Procedures: None ordered today.  Follow-Up: At [facility], you and your health needs are our priority.  As part of our continuing mission to provide you with exceptional heart care, we have created designated Provider Care Teams.  These Care Teams include your primary Cardiologist (physician) and Advanced Practice Providers (APPs -  Physician Assistants and Nurse Practitioners) who all work together to provide you with the care you need, when you need it.  We recommend signing up for the patient portal called "MyChart".  Sign up information is provided on this After Visit Summary.  MyChart is used to connect with patients for Virtual Visits (Telemedicine).  Patients are able to view lab/test results, encounter notes, upcoming appointments, etc.  Non-urgent messages can be sent to your provider as well.   To learn more about what you can do with MyChart, go to [email or web address].    Your next appointment:   6 month(s)  Provider:   [name], MD

## 2022-11-28 DIAGNOSIS — R293 Abnormal posture: Secondary | ICD-10-CM | POA: Diagnosis not present

## 2022-11-28 DIAGNOSIS — M546 Pain in thoracic spine: Secondary | ICD-10-CM | POA: Diagnosis not present

## 2022-11-30 ENCOUNTER — Inpatient Hospital Stay: Admission: RE | Admit: 2022-11-30 | Payer: PPO | Source: Ambulatory Visit

## 2022-12-02 ENCOUNTER — Encounter: Payer: Self-pay | Admitting: Family Medicine

## 2022-12-02 DIAGNOSIS — M439 Deforming dorsopathy, unspecified: Secondary | ICD-10-CM

## 2022-12-02 DIAGNOSIS — E079 Disorder of thyroid, unspecified: Secondary | ICD-10-CM

## 2022-12-02 DIAGNOSIS — M8580 Other specified disorders of bone density and structure, unspecified site: Secondary | ICD-10-CM

## 2022-12-03 DIAGNOSIS — R293 Abnormal posture: Secondary | ICD-10-CM | POA: Diagnosis not present

## 2022-12-03 DIAGNOSIS — M546 Pain in thoracic spine: Secondary | ICD-10-CM | POA: Diagnosis not present

## 2022-12-04 NOTE — Telephone Encounter (Signed)
This still isn't read from 11/26/2022

## 2022-12-05 DIAGNOSIS — R293 Abnormal posture: Secondary | ICD-10-CM | POA: Diagnosis not present

## 2022-12-05 DIAGNOSIS — M546 Pain in thoracic spine: Secondary | ICD-10-CM | POA: Diagnosis not present

## 2022-12-05 NOTE — Addendum Note (Signed)
Addended by: Hyman Hopes B on: 12/05/2022 11:42 AM   Modules accepted: Orders

## 2022-12-05 NOTE — Progress Notes (Signed)
MRI does show an acute/subacute T8 compression fracture along with some disk changes. I'm going to forward this to one of our neurosurgeons to get you an appointment and get his input on next steps given the additional findings.   Incidentally, the MRI also found a mass involving your thyroid. I am ordering a thyroid ultrasound to further evaluate.

## 2022-12-05 NOTE — Telephone Encounter (Signed)
Spoke with patient and advised on results and recommendations, she expressed understanding.

## 2022-12-05 NOTE — Telephone Encounter (Signed)
Pt would like to know why there was an [place] ordered of her thyroid. She states she needs someone to call her because she cannot get into mychart and refused the mychart help line.

## 2022-12-06 NOTE — Progress Notes (Unsigned)
Referring Physician:  Clayborne Dana, NP [address] [place],  [place] [address]  Primary Physician:  Clayborne Dana, NP  History of Present Illness: 12/06/2022*** [name] has a history of DM, HTN, MI, peripheral neuropathy, CAD, and tobacco use. History of cardiac cath and cardiac stenting.   Found to have T8 compresson fracture after having increased back pain after moving/carrying boxes.     She is taking flexeril. Did not tolerate ultram. She continues on neurontin for her neuropathy.   She is taking PLAVIX.   Duration: *** Location: *** Quality: *** Severity: ***  Precipitating: aggravated by *** Modifying factors: made better by *** Weakness: none Timing: *** Bowel/Bladder Dysfunction: none  Conservative measures:  Physical therapy: ***  Multimodal medical therapy including regular antiinflammatories: flexeril, ultram, neurontin  Injections: *** epidural steroid injections  Past Surgery: ***  Christina Larsen has ***no symptoms of cervical myelopathy.  The symptoms are causing a significant impact on the patient's life.   Review of Systems:  A 10 point review of systems is negative, except for the pertinent positives and negatives detailed in the HPI.  Past Medical History: Past Medical History:  Diagnosis Date   Allergy    Coronary artery disease    11/9 NSTEMI, PCI to pLCx with aspiration thrombectomy/DESx1, occluded mRCA with left to right collaterals   Diabetes mellitus without complication (HCC)    HLD (hyperlipidemia)    Hypertension    Myocardial infarction [facility])     Past Surgical History: Past Surgical History:  Procedure Laterality Date   CARDIAC CATHETERIZATION     CHOLECYSTECTOMY  1992   CORONARY STENT INTERVENTION N/A 01/17/2020   Procedure: CORONARY STENT INTERVENTION;  Surgeon: Yvonne Kendall, MD;  Location: [facility] INVASIVE CV LAB;  Service: Cardiovascular;  Laterality: N/A;   LAMINECTOMY  2006   LEFT HEART  CATH AND CORONARY ANGIOGRAPHY N/A 01/17/2020   Procedure: LEFT HEART CATH AND CORONARY ANGIOGRAPHY;  Surgeon: Yvonne Kendall, MD;  Location: [facility] INVASIVE CV LAB;  Service: Cardiovascular;  Laterality: N/A;   rotator cuff surgery      Allergies: Allergies as of 12/11/2022 - Review Complete 11/26/2022  Allergen Reaction Noted   Hydrocodone-acetaminophen  01/16/2020    Medications: Outpatient Encounter Medications as of 12/11/2022  Medication Sig   atorvastatin (LIPITOR) 80 MG tablet Take 1 tablet (80 mg total) by mouth daily.   cholecalciferol (VITAMIN D3) 25 MCG (1000 UNIT) tablet Take 1,000 Units by mouth daily.   clopidogrel (PLAVIX) 75 MG tablet Take 1 tablet (75 mg total) by mouth daily.   cyclobenzaprine (FLEXERIL) 5 MG tablet Take 1 tablet (5 mg total) by mouth 3 (three) times daily as needed for muscle spasms.   Dulaglutide (TRULICITY) 4.5 MG/0.5ML SOPN Inject 4.5 mg as directed once a week.   gabapentin (NEURONTIN) 300 MG capsule Take 1 capsule (300 mg total) by mouth every 12 (twelve) hours as needed.   metFORMIN (GLUCOPHAGE) 1000 MG tablet Take 1 tablet (1,000 mg total) by mouth in the morning and at bedtime.   metoprolol succinate (TOPROL XL) 50 MG 24 hr tablet Take 1 tablet (50 mg total) by mouth daily.   Multiple Vitamin (MULTIVITAMIN) tablet Take 1 tablet by mouth daily.   nitroGLYCERIN (NITROSTAT) 0.4 MG SL tablet Place 1 tablet (0.4 mg total) under the tongue every 5 (five) minutes x 3 doses as needed. Call 911 after 3rd dose if no relief   No facility-administered encounter medications on file as of  12/11/2022.    Social History: Social History   Tobacco Use   Smoking status: Former    Current packs/day: 0.00    Types: Cigarettes    Quit date: 01/16/2020    Years since quitting: 2.8   Smokeless tobacco: Never  Vaping Use   Vaping status: Never Used  Substance Use Topics   Alcohol use: Yes    Alcohol/week: 4.0 - 6.0 standard drinks of alcohol    Types: 3 - 4  Glasses of wine, 1 - 2 Shots of liquor per week   Drug use: Never    Family Medical History: Family History  Problem Relation Age of Onset   Arthritis Mother    Diabetes Mother    Colon cancer Mother    Heart disease Father    Prostate cancer Father    Hypercholesterolemia Father    Cancer Brother    Diabetes Brother    Hyperlipidemia Brother    Hypertension Brother     Physical Examination: There were no vitals filed for this visit.  General: Patient is well developed, well nourished, calm, collected, and in no apparent distress. Attention to examination is appropriate.  Respiratory: Patient is breathing without any difficulty.   NEUROLOGICAL:     Awake, alert, oriented to person, place, and time.  Speech is clear and fluent. Fund of knowledge is appropriate.   Cranial Nerves: Pupils equal round and reactive to light.  Facial tone is symmetric.    *** ROM of cervical spine *** pain *** posterior cervical tenderness. *** tenderness in bilateral trapezial region.   *** ROM of lumbar spine *** pain *** posterior lumbar tenderness.   No abnormal lesions on exposed skin.   Strength: Side Biceps Triceps Deltoid Interossei Grip Wrist Ext. Wrist Flex.  R 5 5 5 5 5 5 5   L 5 5 5 5 5 5 5    Side Iliopsoas Quads Hamstring PF DF EHL  R 5 5 5 5 5 5   L 5 5 5 5 5 5    Reflexes are ***2+ and symmetric at the biceps, brachioradialis, patella and achilles.   Hoffman's is absent.  Clonus is not present.   Bilateral upper and lower extremity sensation is intact to light touch.     Gait is normal.   ***No difficulty with tandem gait.    Medical Decision Making  Imaging: MRI of thoracic spine dated 11/26/22:  FINDINGS: Alignment: Mild convex right scoliosis. No focal angulation or significant listhesis.   Vertebrae: As seen on recent thoracic spine radiographs, there is an inferior endplate compression deformity at T8 which results in approximately 30% loss of vertebral body  height. There is marrow edema within the vertebral body, but no osseous retropulsion. No other definite acute osseous findings are identified. Incomplete visualization of the upper lumbar spine demonstrates endplate edema inferiorly in the L1 vertebral body which is probably discogenic.   Cord: The thoracic cord appears normal in signal and caliber.The conus medullaris extends to the upper L1 level.   Paraspinal and other soft tissues: Possible mild paraspinous edema at T8 without focal fluid collection. No other significant paraspinal abnormalities are identified. 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 This is incompletely visualized, although measures approximately 5.7 x 4.5 x 3.7 cm. Further evaluation with thyroid ultrasound recommended.   Disc levels:   C7-T1: Disc bulging with endplate osteophytes and bilateral facet hypertrophy. No spinal stenosis. Mild  left greater than right foraminal narrowing.   Mild disc degeneration in the upper thoracic spine without resulting cord deformity or significant foraminal narrowing.   T5-6: Small central disc protrusion touches the ventral aspect of the cord. There is ample CSF posterior to the cord. No significant foraminal narrowing.   No other focal disc protrusions are identified. There is mild disc bulging and facet hypertrophy throughout the thoracic spine without resulting spinal stenosis or significant foraminal narrowing.   IMPRESSION: 1. Acute/subacute inferior endplate compression fracture at T8 with approximately 30% loss of vertebral body height. No osseous retropulsion. 2. No other acute osseous findings. Probable discogenic marrow changes at L1-2. 3. Mild thoracic spondylosis as described. There is a small central disc protrusion at T5-6 which touches the ventral aspect of the cord. No cord deformity or abnormal cord  signal. 4. 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 Further evaluation with thyroid ultrasound recommended. (Ref: J Am Coll Radiol. 2015 Feb;12(2): 143-50).     Electronically Signed   By: Carey Bullocks M.D.   On: 12/05/2022 10:57   Thoracic xrays dated 11/05/22:  FINDINGS: New T8 anterior wedge compression deformity noted. No spondylolisthesis. No osteolytic or osteoblastic changes. Thoracic disc spaces are maintained.   IMPRESSION: T8 compression deformity which is new compared to the 2022 study. The chronicity of this finding can be evaluated with MRI, if indicated.     Electronically Signed   By: Layla Maw M.D.   On: 11/12/2022 12:40  I have personally reviewed the images and agree with the above interpretation.   Assessment and Plan: Ms. Wachtel is a pleasant 67 y.o. female has ***  Treatment options discussed with patient and following plan made:   - Order for physical therapy for *** spine ***. Patient to call to schedule appointment. *** - Continue current medications including ***. Reviewed dosing and side effects.  - Prescription for ***. Reviewed dosing and side effects. Take with food.  - Prescription for *** to take prn muscle spasms. Reviewed dosing and side effects. Discussed this can cause drowsiness.  - MRI of *** to further evaluate *** radiculopathy. No improvement time or medications (***).  - Referral to PMR at [facility] to discuss possible *** injections.  - Will schedule phone visit to review MRI results once I get them back.   I spent a total of *** minutes in face-to-face and non-face-to-face activities related to this patient's care today including review of outside records, review of imaging, review of symptoms, physical exam, discussion of differential diagnosis, discussion of treatment options, and documentation.   Thank you for involving me in the care of this patient.    Drake Leach PA-C Dept. of Neurosurgery

## 2022-12-09 ENCOUNTER — Ambulatory Visit (HOSPITAL_BASED_OUTPATIENT_CLINIC_OR_DEPARTMENT_OTHER)
Admission: RE | Admit: 2022-12-09 | Discharge: 2022-12-09 | Disposition: A | Discharge status: Home / Self Care | Payer: PPO | Source: Ambulatory Visit | Attending: Family Medicine | Admitting: Family Medicine

## 2022-12-09 DIAGNOSIS — E079 Disorder of thyroid, unspecified: Secondary | ICD-10-CM | POA: Insufficient documentation

## 2022-12-09 DIAGNOSIS — M546 Pain in thoracic spine: Secondary | ICD-10-CM | POA: Diagnosis not present

## 2022-12-09 DIAGNOSIS — E042 Nontoxic multinodular goiter: Secondary | ICD-10-CM | POA: Diagnosis not present

## 2022-12-09 DIAGNOSIS — R293 Abnormal posture: Secondary | ICD-10-CM | POA: Diagnosis not present

## 2022-12-10 ENCOUNTER — Encounter: Payer: Self-pay | Admitting: Family Medicine

## 2022-12-11 ENCOUNTER — Encounter: Payer: Self-pay | Admitting: Orthopedic Surgery

## 2022-12-11 ENCOUNTER — Ambulatory Visit: Payer: PPO | Admitting: Orthopedic Surgery

## 2022-12-11 VITALS — BP 130/78 | Ht 65.0 in | Wt 155.0 lb

## 2022-12-11 DIAGNOSIS — M8580 Other specified disorders of bone density and structure, unspecified site: Secondary | ICD-10-CM | POA: Diagnosis not present

## 2022-12-11 DIAGNOSIS — S22060A Wedge compression fracture of T7-T8 vertebra, initial encounter for closed fracture: Secondary | ICD-10-CM | POA: Diagnosis not present

## 2022-12-11 NOTE — Patient Instructions (Signed)
It was so nice to see you today. Thank you so much for coming in.    You have a T8 compression fracture and I think this is causing your pain.   I put in a referral for you to see Interventional Radiology at [facility] to discuss a kyphoplasty procedure. They will call you to schedule.   I gave you a work note.   I would hold on PT for now.   I will message you after your kyphoplasty to check on you.   Please do not hesitate to call if you have any questions or concerns. You can also message me in MyChart.   Drake Leach PA-C [phone]

## 2022-12-11 NOTE — Addendum Note (Signed)
Addended byDrake Leach on: 12/11/2022 03:43 PM   Modules accepted: Orders

## 2022-12-12 ENCOUNTER — Other Ambulatory Visit: Payer: Self-pay | Admitting: Family Medicine

## 2022-12-12 ENCOUNTER — Telehealth: Payer: Self-pay | Admitting: Cardiovascular Disease

## 2022-12-12 DIAGNOSIS — E041 Nontoxic single thyroid nodule: Secondary | ICD-10-CM

## 2022-12-12 NOTE — Telephone Encounter (Signed)
   Pre-operative Risk Assessment    Patient Name: Christina Larsen  DOB: June 09, 1955 MRN: [id number]      Request for Surgical Clearance    Procedure:  Kyphoplasty Thoracic  Date of Surgery: TBD                                 Surgeon:  Dr. Bryn Gulling Surgeon's Group or Practice Name:  [facility] Phone number:  [phone]  Fax number:  [phone]   Type of Clearance Requested:   - Medical  - Pharmacy:  Hold Clopidogrel (Plavix) 5 days prior to surgery   Type of Anesthesia:   Moderate Sedation   Additional requests/questions:  Please fax a copy of medical clearance to the surgeon's office.  Mardelle Matte   12/12/2022, 11:47 AM

## 2022-12-16 ENCOUNTER — Ambulatory Visit
Admission: RE | Admit: 2022-12-16 | Discharge: 2022-12-16 | Disposition: A | Discharge status: Home / Self Care | Payer: PPO | Source: Ambulatory Visit | Attending: Orthopedic Surgery | Admitting: Orthopedic Surgery

## 2022-12-16 ENCOUNTER — Other Ambulatory Visit (HOSPITAL_COMMUNITY): Payer: Self-pay | Admitting: Interventional Radiology

## 2022-12-16 ENCOUNTER — Other Ambulatory Visit: Payer: Self-pay | Admitting: Family Medicine

## 2022-12-16 DIAGNOSIS — S22060A Wedge compression fracture of T7-T8 vertebra, initial encounter for closed fracture: Secondary | ICD-10-CM | POA: Diagnosis not present

## 2022-12-16 DIAGNOSIS — E041 Nontoxic single thyroid nodule: Secondary | ICD-10-CM

## 2022-12-16 LAB — COMPLETE METABOLIC PANEL WITH GFR
AG Ratio: 1.6 (calc) (ref 1.0–2.5)
ALT: 21 U/L (ref 6–29)
AST: 18 U/L (ref 10–35)
Albumin: 4.3 g/dL (ref 3.6–5.1)
Alkaline phosphatase (APISO): 80 U/L (ref 37–153)
BUN: 15 mg/dL (ref 7–25)
CO2: 24 mmol/L (ref 20–32)
Calcium: 9.7 mg/dL (ref 8.6–10.4)
Chloride: 105 mmol/L (ref 98–110)
Creat: 0.7 mg/dL (ref 0.50–1.05)
Globulin: 2.7 g/dL (ref 1.9–3.7)
Glucose, Bld: 193 mg/dL — ABNORMAL HIGH (ref 65–99)
Potassium: 4.2 mmol/L (ref 3.5–5.3)
Sodium: 138 mmol/L (ref 135–146)
Total Bilirubin: 0.7 mg/dL (ref 0.2–1.2)
Total Protein: 7 g/dL (ref 6.1–8.1)
eGFR: 94 mL/min/{1.73_m2} (ref >=60)

## 2022-12-16 LAB — PROTIME-INR
INR: 1
Prothrombin Time: 10.6 s (ref 9.0–11.5)

## 2022-12-16 LAB — CBC
HCT: 45 % (ref 35.0–45.0)
Hemoglobin: 14.9 g/dL (ref 11.7–15.5)
MCH: 30.5 pg (ref 27.0–33.0)
MCHC: 33.1 g/dL (ref 32.0–36.0)
MCV: 92 fL (ref 80.0–100.0)
MPV: 12.1 fL (ref 7.5–12.5)
Platelets: 223 10*3/uL (ref 140–400)
RBC: 4.89 10*6/uL (ref 3.80–5.10)
RDW: 12.9 % (ref 11.0–15.0)
WBC: 7.8 10*3/uL (ref 3.8–10.8)

## 2022-12-16 NOTE — Consult Note (Signed)
Chief Complaint: Patient was seen in consultation today for painful T8 vertebral comression fraction at the request of Luna,Stacy  Referring Physician(s): Luna,Stacy  History of Present Illness: Christina Larsen is a 67 y.o. female who developed mid thoracic pain end of August after moving furniture. Pain at the bra level. Increased with deep respirations. LIttle response to steroid regimen. Tramadol and vicodin poorly tolerated due to nausea/vomiting. Pain partially controlled with flexeril. She can still work but must move slowly due to pain. Rates pain 3/10 on VAS. Scores 21/24 on [employer] disability questionnaire. Osteopenia recently diagnosed on DEXA.   Past Medical History:  Diagnosis Date   Allergy    Coronary artery disease    11/9 NSTEMI, PCI to pLCx with aspiration thrombectomy/DESx1, occluded mRCA with left to right collaterals   Diabetes mellitus without complication (HCC)    HLD (hyperlipidemia)    Hypertension    Myocardial infarction [facility])     Past Surgical History:  Procedure Laterality Date   CARDIAC CATHETERIZATION     CHOLECYSTECTOMY  1992   CORONARY STENT INTERVENTION N/A 01/17/2020   Procedure: CORONARY STENT INTERVENTION;  Surgeon: Yvonne Kendall, MD;  Location: [facility] INV[facility] CV [facility];  Service: Cardiovascular;  Laterality: N/A;   LAMINECTOMY  2006   LEFT HEART CATH AND CORONARY ANGIOGRAPHY N/A 01/17/2020   Procedure: LEFT HEART CATH AND CORONARY ANGIOGRAPHY;  Surgeon: Yvonne Kendall, MD;  Location: [facility] INVAS[facility] CV [facility];  Service: Cardiovascular;  Laterality: N/A;   rotator cuff surgery      Allergies: Hydrocodone-acetaminophen  Medications: Prior to Admission medications   Medication Sig Start Date End Date Taking? Authorizing Provider  aspirin EC 81 MG tablet Take 81 mg by mouth daily. Swallow whole.   Yes [provider]  atorvastatin (LIPITOR) 80 MG tablet Take 1 tablet (80 mg total) by mouth daily. 05/15/22 05/15/23  Clayborne Dana, NP   cholecalciferol (VITAMIN D3) 25 MCG (1000 UNIT) tablet Take 1,000 Units by mouth daily.    [provider]  clopidogrel (PLAVIX) 75 MG tablet Take 1 tablet (75 mg total) by mouth daily. 04/15/22   [name], MD  cyclobenzaprine (FLEXERIL) 5 MG tablet Take 1 tablet (5 mg total) by mouth 3 (three) times daily as needed for muscle spasms. 10/28/22   Clayborne Dana, NP  Dulaglutide (TRULICITY) 4.5 MG/0.5ML SOPN Inject 4.5 mg as directed once a week. 10/16/22   Clayborne Dana, NP  gabapentin (NEURONTIN) 300 MG capsule Take 1 capsule (300 mg total) by mouth every 12 (twelve) hours as needed. 10/31/22   Clayborne Dana, NP  metFORMIN (GLUCOPHAGE) 1000 MG tablet Take 1 tablet (1,000 mg total) by mouth in the morning and at bedtime. 05/15/22   Clayborne Dana, NP  metoprolol succinate (TOPROL XL) 50 MG 24 hr tablet Take 1 tablet (50 mg total) by mouth daily. 04/15/22   [name], MD  Multiple Vitamin (MULTIVITAMIN) tablet Take 1 tablet by mouth daily.    [provider]  nitroGLYCERIN (NITROSTAT) 0.4 MG SL tablet Place 1 tablet (0.4 mg total) under the tongue every 5 (five) minutes x 3 doses as needed. Call 911 after 3rd dose if no relief 04/10/22 04/10/23  [name], MD     Family History  Problem Relation Age of Onset   Arthritis Mother    Diabetes Mother    Colon cancer Mother    Heart disease Father    Prostate cancer Father    Hypercholesterolemia Father  Cancer Brother    Diabetes Brother    Hyperlipidemia Brother    Hypertension Brother     Social History   Socioeconomic History   Marital status: Widowed    Spouse name: Not on file   Number of children: 1   Years of education: 15   Highest education level: Associate degree: academic program  Occupational History   Occupation: [employer]  Tobacco Use   Smoking status: Former    Current packs/day: 0.00    Types: Cigarettes    Quit date: 01/16/2020    Years since quitting: 2.9   Smokeless tobacco:  Never  Vaping Use   Vaping status: Never Used  Substance and Sexual Activity   Alcohol use: Yes    Alcohol/week: 4.0 - 6.0 standard drinks of alcohol    Types: 3 - 4 Glasses of wine, 1 - 2 Shots of liquor per week   Drug use: Never   Sexual activity: Not Currently  Other Topics Concern   Not on file  Social History Narrative   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   Financial Resource Strain: Low Risk  (10/22/2022)   Overall Financial Resource Strain (CARDIA)    Difficulty of Paying Living Expenses: Not very hard  Food Insecurity: Unknown (10/22/2022)   Hunger Vital Sign    Worried About Running Out of Food in the Last Year: Patient declined    Ran Out of Food in the Last Year: Never true  Transportation Needs: No Transportation Needs (10/22/2022)   PRAPARE - Administrator, Civil Service (Medical): No    Lack of Transportation (Non-Medical): No  Physical Activity: Insufficiently Active (10/22/2022)   Exercise Vital Sign    Days of Exercise per Week: 2 days    Minutes of Exercise per Session: 20 min  Stress: Stress Concern Present (10/22/2022)   [employer] of Occupational Health - Occupational Stress Questionnaire    Feeling of Stress : To some extent  Social Connections: Unknown (10/22/2022)   Social Connection and Isolation Panel [NHANES]    Frequency of Communication with Friends and Family: Three times a week    Frequency of Social Gatherings with Friends and Family: Twice a week    Attends Religious Services: Patient declined    Database administrator or Organizations: No    Attends Engineer, structural: Not on file    Marital Status: Widowed    ECOG Status: 2 - Symptomatic, <50% confined to bed  Review of Systems: A 12 point ROS discussed and pertinent positives are indicated in the HPI above.  All other systems are negative.  Review of Systems  Vital Signs: BP (!) 153/82 (BP Location: Right Arm, Patient Position: Sitting)   Pulse  78   Resp 18   SpO2 96% Comment: room air   Physical Exam Constitutional: Oriented to person, place, and time. Well-developed and well-nourished. No distress.   HENT:  Head: Normocephalic and atraumatic.  Eyes: Conjunctivae and EOM are normal. Right eye exhibits no discharge. Left eye exhibits no discharge. No scleral icterus.  Neck: No JVD present.  Pulmonary/Chest: Effort normal. No stridor. No respiratory distress. Tenderness over midline mid thorax. Abdomen: soft, non distended Neurological:  alert and oriented to person, place, and time.  Skin: Skin is warm and dry.  not diaphoretic.  Psychiatric:   normal mood and affect.   behavior is normal. Judgment and thought content normal.          Imaging:  US THYROID  Addendum Date: 12/13/2022   ADDENDUM REPORT: 12/13/2022 15:54 ADDENDUM: To address numerical IMPRESSION: Left inferior thyroid nodule (labeled 2, 6.5 cm) meets criteria for biopsy, as designated by the newly established ACR TI-RADS criteria, and referral for biopsy is recommended. Recommendations follow those established by the new ACR TI-RADS criteria (J Am Coll Radiol 2017;14:587-595). Electronically Signed   By: Gilmer Mor D.O.   On: 12/13/2022 15:54   Result Date: 12/13/2022 CLINICAL DATA:  67 year old female with left thyroid mass EXAM: THYROID ULTRASOUND TECHNIQUE: Ultrasound examination of the thyroid gland and adjacent soft tissues was performed. COMPARISON:  None Available. FINDINGS: Parenchymal Echotexture: Mildly heterogenous Isthmus: 0.6 cm Right lobe: 4.2 cm x 1.3 cm x 1.7 cm Left lobe: 8.4 cm x 3.5 cm x 3.1 cm _________________________________________________________ Estimated total number of nodules >/= 1 cm: 1 Number of spongiform nodules >/=  2 cm not described below (TR1): 0 Number of mixed cystic and solid nodules >/= 1.5 cm not described below (TR2): 0 _________________________________________________________ Nodule labeled 1 in the mid right thyroid, 9 mm  x 8 mm x 6 mm. Nodule is spongiform and does not meet criteria for surveillance. Nodule labeled 2, inferior left thyroid, 6.5 cm x 3.0 cm x 4.3 cm. Nodule has TR 3 characteristics and meets criteria for biopsy. No adenopathy IMPRESSION: Left inferior thyroid nodule (labeled 1, 6.5 cm) meets criteria for biopsy, as designated by the newly established ACR TI-RADS criteria, and referral for biopsy is recommended. Recommendations follow those established by the new ACR TI-RADS criteria (J Am Coll Radiol 2017;14:587-595). Electronically Signed: By: Gilmer Mor D.O. On: 12/12/2022 15:54   MR THORACIC SPINE WO CONTRAST  Result Date: 12/05/2022 CLINICAL DATA:  Mid back pain. T8 compression deformity on radiographs. EXAM: MRI THORACIC SPINE WITHOUT CONTRAST TECHNIQUE: Multiplanar, multisequence MR imaging of the thoracic spine was performed. No intravenous contrast was administered. COMPARISON:  Thoracic spine radiographs 11/05/2022. Chest radiographs 05/07/2020. FINDINGS: Alignment: Mild convex right scoliosis. No focal angulation or significant listhesis. Vertebrae: As seen on recent thoracic spine radiographs, there is an inferior endplate compression deformity at T8 which results in approximately 30% loss of vertebral body height. There is marrow edema within the vertebral body, but no osseous retropulsion. No other definite acute osseous findings are identified. Incomplete visualization of the upper lumbar spine demonstrates endplate edema inferiorly in the L1 vertebral body which is probably discogenic. Cord: The thoracic cord appears normal in signal and caliber.The conus medullaris extends to the upper L1 level. Paraspinal and other soft tissues: Possible mild paraspinous edema at T8 without focal fluid collection. No other significant paraspinal abnormalities are identified. There is a large heterogeneous mass involving the left lobe of the thyroid gland which results in moderate tracheal deviation to the  right. This is incompletely visualized, although measures approximately 5.7 x 4.5 x 3.7 cm. Further evaluation with thyroid ultrasound recommended. Disc levels: C7-T1: Disc bulging with endplate osteophytes and bilateral facet hypertrophy. No spinal stenosis. Mild left greater than right foraminal narrowing. Mild disc degeneration in the upper thoracic spine without resulting cord deformity or significant foraminal narrowing. T5-6: Small central disc protrusion touches the ventral aspect of the cord. There is ample CSF posterior to the cord. No significant foraminal narrowing. No other focal disc protrusions are identified. There is mild disc bulging and facet hypertrophy throughout the thoracic spine without resulting spinal stenosis or significant foraminal narrowing. IMPRESSION: 1. Acute/subacute inferior endplate compression fracture at T8 with approximately 30% loss of vertebral body  height. No osseous retropulsion. 2. No other acute osseous findings. Probable discogenic marrow changes at L1-2. 3. Mild thoracic spondylosis as described. There is a small central disc protrusion at T5-6 which touches the ventral aspect of the cord. No cord deformity or abnormal cord signal. 4. 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 Further evaluation with thyroid ultrasound recommended. (Ref: J Am Coll Radiol. 2015 Feb;12(2): 143-50). Electronically Signed   By: Carey Bullocks M.D.   On: 12/05/2022 10:57    Labs:  CBC: No results for input(s): "WBC", "HGB", "HCT", "PLT" in the last 8760 hours.  COAGS: No results for input(s): "INR", "APTT" in the last 8760 hours.  BMP: Recent Labs    01/24/22 0834 07/31/22 0723  NA 139 140  K 4.4 4.4  CL 102 103  CO2 28 28  GLUCOSE 186* 129*  BUN 14 15  CALCIUM 9.8 9.8  CREATININE 0.74 0.72    LIVER FUNCTION TESTS: Recent Labs    01/24/22 0834 07/31/22 0723  BILITOT 0.8 0.8  AST 18 17  ALT 25 20   ALKPHOS 74 64  PROT 7.5 6.9  ALBUMIN 4.6 4.3    TUMOR MARKERS: No results for input(s): "AFPTM", "CEA", "CA199", "CHROMGRNA" in the last 8760 hours.  Assessment and Plan:  My impression is that this patient has subacute unhealed T8 compression fracture deformity which likely contribute or account for most of the low back pain.  Based on cross-sectional imaging, this would be anatomically approachable for percutaneous intervention.  No associated spinal stenosis or other contraindications.  No suggestion of metastatic disease or other pathologic findings to indicate a need for concomitant core biopsy. Given the  lack of adequate symptom relief with time and a   pain medication regimen she can tolerate, and  given limitations of activities of daily living, the patient  is clinically an appropriate candidate for consideration of vertebral augmentation. I discussed with the patient  the pathophysiology of vertebral compression fracture deformities; the stable nature of these which does not require emergent treatment; natural history which includes healing over some unpredictable number of months.  We discussed treatment options including watchful waiting, surgical fixation, and percutaneous kyphoplasty/vertebroplasty.  We discussed in detail the percutaneous kyphoplasty technique, anticipated benefits, time course to symptom resolution, possible risks and side effects.  We discussed his elevated risk of additional level fractures with or without vertebral augmentation.  We discussed the long-term need for   bone building therapy managed by the patient's PCP Dr. Reola Calkins.   She seemed to understand, and did ask appropriate questions. The patient is motivated to proceed with treatment ASAP.  Accordingly, we schedule percutaneous T8 kyphoplasty under moderate sedation   as an outpatient at the patient's convenience, pending carrier approval if needed.  We will need to contact her cardiologist Dr. Nicholaus Bloom to  coordinate holding her plavix and ASA.   Thank you for this interesting consult.  I greatly enjoyed meeting Christina Larsen and look forward to participating in their care.  A copy of this report was sent to the requesting provider on this date.  Electronically Signed: Durwin Glaze 12/16/2022, 10:55 AM   I spent a total of  40 Minutes   in face to face in clinical consultation, greater than 50% of which was counseling/coordinating care for painful T8 compression fracture.

## 2022-12-18 ENCOUNTER — Other Ambulatory Visit (HOSPITAL_COMMUNITY)
Admission: RE | Admit: 2022-12-18 | Discharge: 2022-12-18 | Disposition: A | Discharge status: Home / Self Care | Payer: PPO | Source: Ambulatory Visit | Attending: Family Medicine | Admitting: Family Medicine

## 2022-12-18 ENCOUNTER — Ambulatory Visit
Admission: RE | Admit: 2022-12-18 | Discharge: 2022-12-18 | Disposition: A | Discharge status: Home / Self Care | Payer: PPO | Source: Ambulatory Visit | Attending: Family Medicine | Admitting: Family Medicine

## 2022-12-18 ENCOUNTER — Other Ambulatory Visit (HOSPITAL_BASED_OUTPATIENT_CLINIC_OR_DEPARTMENT_OTHER): Payer: Self-pay

## 2022-12-18 DIAGNOSIS — E041 Nontoxic single thyroid nodule: Secondary | ICD-10-CM | POA: Insufficient documentation

## 2022-12-18 DIAGNOSIS — E0789 Other specified disorders of thyroid: Secondary | ICD-10-CM | POA: Diagnosis not present

## 2022-12-18 MED ORDER — ALENDRONATE SODIUM 70 MG PO TABS
70.0000 mg | ORAL_TABLET | ORAL | 3 refills | Status: DC
  Filled 2022-12-18: qty 12, 84d supply, fill #0
  Filled 2023-02-21: qty 12, 84d supply, fill #1

## 2022-12-20 ENCOUNTER — Other Ambulatory Visit: Payer: PPO

## 2022-12-20 LAB — CYTOLOGY - NON PAP

## 2022-12-23 NOTE — Addendum Note (Signed)
Addended by: Hyman Hopes B on: 12/23/2022 08:52 AM   Modules accepted: Orders

## 2022-12-24 ENCOUNTER — Other Ambulatory Visit: Payer: Self-pay | Admitting: Interventional Radiology

## 2022-12-24 DIAGNOSIS — S22000G Wedge compression fracture of unspecified thoracic vertebra, subsequent encounter for fracture with delayed healing: Secondary | ICD-10-CM

## 2022-12-24 NOTE — Telephone Encounter (Signed)
Referring office called in asking for an update on pt's clearance. Pt is now scheduled for 01/01/23. Please advise.

## 2022-12-24 NOTE — Telephone Encounter (Signed)
I will forward this to the pre op APP to please review if pt has been cleared by Dr. Tresa Endo.

## 2022-12-24 NOTE — Telephone Encounter (Signed)
Dr. Tresa Endo, please advise regarding patient's fitness to undergo kyphoplasty of thoracic spine.

## 2022-12-25 ENCOUNTER — Other Ambulatory Visit: Payer: Self-pay | Admitting: Surgery

## 2022-12-25 DIAGNOSIS — E042 Nontoxic multinodular goiter: Secondary | ICD-10-CM

## 2022-12-25 DIAGNOSIS — E049 Nontoxic goiter, unspecified: Secondary | ICD-10-CM | POA: Diagnosis not present

## 2022-12-26 NOTE — Telephone Encounter (Signed)
Will forward to pre op APP as looks like waiting on recommendations from Dr. Tresa Endo.

## 2022-12-26 NOTE — Telephone Encounter (Signed)
Vicky of [facility] is following up requesting feedback regarding clearance request. Procedure is still set for 10/23 and patient would need to alter blood thinner therapy--hold Plavix + Aspirin 5 days prior. Their office would like feedback soon. Looks like Dr. Tresa Endo is out of office this week. Is anyone able to advise on his behalf or will procedure need to be moved out? Please advise. If call is returned tomorrow, please call [phone].

## 2022-12-29 NOTE — Telephone Encounter (Signed)
Patient is cleared to undergo procedure.  Okay to hold aspirin and Plavix for 5 days for procedure

## 2022-12-30 NOTE — Telephone Encounter (Signed)
     Primary Cardiologist: [name], MD  Chart reviewed as part of pre-operative protocol coverage. Given past medical history and time since last visit, based on ACC/AHA guidelines, Christina Larsen would be at acceptable risk for the planned procedure without further cardiovascular testing.   His RCRI is moderate risk 6.6% risk of major cardiac event.  His Plavix may be held for 5 days prior to his surgery.  Please resume as soon as hemostasis is achieved.  His aspirin may be held for 5 to 7 days prior to his procedure.  Please resume as soon as hemostasis is achieved.  I will route this recommendation to the requesting party via Epic fax function and remove from pre-op pool.  Please call with questions.  [name]. [name] NP-C     12/30/2022, 9:18 AM [facility] [address] Office [phone] Fax [phone]

## 2022-12-31 NOTE — Discharge Instructions (Addendum)
Kyphoplasty Post Procedure Discharge Instructions  May resume a regular diet and any medications that you routinely take (including pain medications). However, if you are taking Aspirin or an anticoagulant/blood thinner you will be told when you can resume taking these by the healthcare provider. No driving day of procedure. The day of your procedure take it easy. You may use an ice pack as needed to injection sites on back.  Ice to back 30 minutes on and 30 minutes off, as needed. May remove bandaids tomorrow after taking a shower. Replace daily with a clean bandaid until healed.  Do not lift anything heavier than a milk jug for 1-2 weeks or determined by your physician.  Follow up with your physician in 2 weeks.    Please contact our office at [phone] for the following symptoms or if you have any questions:  Fever greater than 100 degrees Increased swelling, pain, or redness at injection site. Increased back and/or leg pain New numbness or change in symptoms from before the procedure.     Thank you for visiting [facility].  YOU MAY RESUME YOUR ASPIRIN AND PLAVIX TODAY, POST PROCEDURE.

## 2023-01-01 ENCOUNTER — Ambulatory Visit
Admission: RE | Admit: 2023-01-01 | Discharge: 2023-01-01 | Disposition: A | Discharge status: Home / Self Care | Payer: PPO | Source: Ambulatory Visit | Attending: Interventional Radiology | Admitting: Interventional Radiology

## 2023-01-01 DIAGNOSIS — M4854XA Collapsed vertebra, not elsewhere classified, thoracic region, initial encounter for fracture: Secondary | ICD-10-CM | POA: Diagnosis not present

## 2023-01-01 DIAGNOSIS — S22000G Wedge compression fracture of unspecified thoracic vertebra, subsequent encounter for fracture with delayed healing: Secondary | ICD-10-CM

## 2023-01-01 HISTORY — PX: IR KYPHO THORACIC WITH BONE BIOPSY: IMG5518

## 2023-01-01 MED ORDER — SODIUM CHLORIDE 0.9 % IV SOLN: INTRAVENOUS | Status: DC

## 2023-01-01 MED ORDER — CEFAZOLIN SODIUM-DEXTROSE 2-4 GM/100ML-% IV SOLN
2.0000 g | INTRAVENOUS | Status: AC
Start: 2023-01-01 — End: 2023-01-01
  Administered 2023-01-01: 2 g via INTRAVENOUS

## 2023-01-01 MED ORDER — FENTANYL CITRATE PF 50 MCG/ML IJ SOSY
25.0000 ug | PREFILLED_SYRINGE | INTRAMUSCULAR | Status: DC | PRN
  Administered 2023-01-01 (×3): 25 ug via INTRAVENOUS

## 2023-01-01 MED ORDER — KETOROLAC TROMETHAMINE 30 MG/ML IJ SOLN
30.0000 mg | Freq: Once | INTRAMUSCULAR | Status: AC
  Administered 2023-01-01: 30 mg via INTRAVENOUS

## 2023-01-01 MED ORDER — MIDAZOLAM HCL 2 MG/2ML IJ SOLN
1.0000 mg | INTRAMUSCULAR | Status: DC | PRN
  Administered 2023-01-01 (×3): 1 mg via INTRAVENOUS

## 2023-01-01 NOTE — Progress Notes (Signed)
Pt back in nursing recovery area. Pt still drowsy from procedure but will wake up when spoken to. Pt follows commands, talks in complete sentences and has no complaints at this time. Pt will remain in nurses station until discharged by Radiologist.   

## 2023-01-07 ENCOUNTER — Ambulatory Visit
Admission: RE | Admit: 2023-01-07 | Discharge: 2023-01-07 | Disposition: A | Discharge status: Home / Self Care | Payer: PPO | Source: Ambulatory Visit | Attending: Surgery | Admitting: Surgery

## 2023-01-07 DIAGNOSIS — E049 Nontoxic goiter, unspecified: Secondary | ICD-10-CM

## 2023-01-07 DIAGNOSIS — E042 Nontoxic multinodular goiter: Secondary | ICD-10-CM

## 2023-01-07 DIAGNOSIS — E041 Nontoxic single thyroid nodule: Secondary | ICD-10-CM | POA: Diagnosis not present

## 2023-01-07 DIAGNOSIS — R490 Dysphonia: Secondary | ICD-10-CM | POA: Diagnosis not present

## 2023-01-07 DIAGNOSIS — R1310 Dysphagia, unspecified: Secondary | ICD-10-CM | POA: Diagnosis not present

## 2023-01-07 MED ORDER — IOPAMIDOL (ISOVUE-300) INJECTION 61%
500.0000 mL | Freq: Once | INTRAVENOUS | Status: AC | PRN
  Administered 2023-01-07: 80 mL via INTRAVENOUS

## 2023-01-08 ENCOUNTER — Telehealth: Payer: Self-pay

## 2023-01-08 NOTE — Telephone Encounter (Signed)
Phone call to pt to follow up from her kyphoplasty on 11/23/21. Pt reports her pain is almost completely gone post procedure but is still having "a little soreness", when bending. Pt rating pain is generally 1/10. Getting around better and feels great.    Pt denies any signs of infection, no redness at the site, no draining or fever. Pt has no complaints at this time and will be scheduled for a telephone follow up with Dr. Deanne Coffer next week.   Pt advised to call back if anything were to change or any concerns arise and we will arrange an in person appointment. Pt verbalized understanding.

## 2023-01-10 ENCOUNTER — Other Ambulatory Visit: Payer: Self-pay | Admitting: Interventional Radiology

## 2023-01-10 DIAGNOSIS — S22060A Wedge compression fracture of T7-T8 vertebra, initial encounter for closed fracture: Secondary | ICD-10-CM

## 2023-01-14 ENCOUNTER — Other Ambulatory Visit (HOSPITAL_BASED_OUTPATIENT_CLINIC_OR_DEPARTMENT_OTHER): Payer: Self-pay

## 2023-01-14 ENCOUNTER — Other Ambulatory Visit: Payer: Self-pay

## 2023-01-14 DIAGNOSIS — E785 Hyperlipidemia, unspecified: Secondary | ICD-10-CM | POA: Diagnosis not present

## 2023-01-14 DIAGNOSIS — E1169 Type 2 diabetes mellitus with other specified complication: Secondary | ICD-10-CM | POA: Diagnosis not present

## 2023-01-14 DIAGNOSIS — I2510 Atherosclerotic heart disease of native coronary artery without angina pectoris: Secondary | ICD-10-CM | POA: Diagnosis not present

## 2023-01-14 DIAGNOSIS — I252 Old myocardial infarction: Secondary | ICD-10-CM | POA: Diagnosis not present

## 2023-01-14 DIAGNOSIS — M62838 Other muscle spasm: Secondary | ICD-10-CM | POA: Diagnosis not present

## 2023-01-14 DIAGNOSIS — I10 Essential (primary) hypertension: Secondary | ICD-10-CM | POA: Diagnosis not present

## 2023-01-14 DIAGNOSIS — I77810 Thoracic aortic ectasia: Secondary | ICD-10-CM | POA: Diagnosis not present

## 2023-01-14 DIAGNOSIS — Z87891 Personal history of nicotine dependence: Secondary | ICD-10-CM | POA: Diagnosis not present

## 2023-01-14 DIAGNOSIS — M8580 Other specified disorders of bone density and structure, unspecified site: Secondary | ICD-10-CM | POA: Diagnosis not present

## 2023-01-14 DIAGNOSIS — G629 Polyneuropathy, unspecified: Secondary | ICD-10-CM | POA: Diagnosis not present

## 2023-01-14 DIAGNOSIS — E663 Overweight: Secondary | ICD-10-CM | POA: Diagnosis not present

## 2023-01-14 DIAGNOSIS — E039 Hypothyroidism, unspecified: Secondary | ICD-10-CM | POA: Diagnosis not present

## 2023-01-15 ENCOUNTER — Encounter: Payer: Self-pay | Admitting: Family Medicine

## 2023-01-15 ENCOUNTER — Inpatient Hospital Stay
Admission: RE | Admit: 2023-01-15 | Discharge: 2023-01-15 | Discharge status: Home / Self Care | Payer: PPO | Source: Ambulatory Visit | Attending: Interventional Radiology | Admitting: Interventional Radiology

## 2023-01-15 DIAGNOSIS — M439 Deforming dorsopathy, unspecified: Secondary | ICD-10-CM

## 2023-01-15 DIAGNOSIS — M8580 Other specified disorders of bone density and structure, unspecified site: Secondary | ICD-10-CM

## 2023-01-15 DIAGNOSIS — S22060A Wedge compression fracture of T7-T8 vertebra, initial encounter for closed fracture: Secondary | ICD-10-CM | POA: Diagnosis not present

## 2023-01-15 NOTE — Progress Notes (Signed)
Chief Complaint: Patient was consulted remotely today (TeleHealth) for compression fracture post kyphoplasty  at the request of Jatinder Mcdonagh.    Referring Physician(s): Luna,Stacy   History of Present Illness: Christina Larsen is a 67 y.o. female  who  Mid Aug 2024 developed mid thoracic pain end of August after moving furniture. Pain at the bra level. Increased with deep respirations. LIttle response to steroid regimen. Tramadol and vicodin poorly tolerated due to nausea/vomiting. Pain partially controlled with flexeril. She could still work but must move slowly due to pain.  11/13/22 Osteopenia   diagnosed on DEXA.  11/26/22 MR showed Acute/subacute inferior endplate compression fracture at T8 with approximately 30% loss of vertebral body height. No osseous retropulsion. 12/16/22 presented for consultation due to severe pain. Rates pain 3/10 on VAS. Scores 21/24 on [employer] disability questionnaire.  01/01/23 Uncomplicated T8 kyphoplasty  She has done well post procedure. Needle sites healed. Returned to work. Pain 1/10. No need for narcotic level pain meds. Some back aching at end of day. No new pain. No radicular symptoms.   Past Medical History:  Diagnosis Date   Allergy    Coronary artery disease    11/9 NSTEMI, PCI to pLCx with aspiration thrombectomy/DESx1, occluded mRCA with left to right collaterals   Diabetes mellitus without complication (HCC)    HLD (hyperlipidemia)    Hypertension    Myocardial infarction [facility])     Past Surgical History:  Procedure Laterality Date   CARDIAC CATHETERIZATION     CHOLECYSTECTOMY  1992   CORONARY STENT INTERVENTION N/A 01/17/2020   Procedure: CORONARY STENT INTERVENTION;  Surgeon: Yvonne Kendall, MD;  Location: [facility] INV[facility]IVE [facility];  Service: Cardiovascular;  Laterality: N/A;   IR KYPHO THORACIC WITH BONE BIOPSY  01/01/2023   LAMINECTOMY  2006   LEFT HEART CATH AND CORONARY ANGIOGRAPHY N/A 01/17/2020   Procedure: LEFT HEART CATH  AND CORONARY ANGIOGRAPHY;  Surgeon: Yvonne Kendall, MD;  Location: [facility] INVASIVE CV LAB;  Service: Cardiovascular;  Laterality: N/A;   rotator cuff surgery      Allergies: Hydrocodone-acetaminophen  Medications: Prior to Admission medications   Medication Sig Start Date End Date Taking? Authorizing Provider  alendronate (FOSAMAX) 70 MG tablet Take 1 tablet (70 mg total) by mouth once a week. Take with a full glass of water on an empty stomach. 12/18/22   Clayborne Dana, NP  aspirin EC 81 MG tablet Take 81 mg by mouth daily. Swallow whole.    [provider]  atorvastatin (LIPITOR) 80 MG tablet Take 1 tablet (80 mg total) by mouth daily. 05/15/22 05/15/23  Clayborne Dana, NP  cholecalciferol (VITAMIN D3) 25 MCG (1000 UNIT) tablet Take 1,000 Units by mouth daily.    [provider]  clopidogrel (PLAVIX) 75 MG tablet Take 1 tablet (75 mg total) by mouth daily. 04/15/22   [name], MD  cyclobenzaprine (FLEXERIL) 5 MG tablet Take 1 tablet (5 mg total) by mouth 3 (three) times daily as needed for muscle spasms. 10/28/22   Clayborne Dana, NP  Dulaglutide (TRULICITY) 4.5 MG/0.5ML SOPN Inject 4.5 mg as directed once a week. 10/16/22   Clayborne Dana, NP  gabapentin (NEURONTIN) 300 MG capsule Take 1 capsule (300 mg total) by mouth every 12 (twelve) hours as needed. 10/31/22   Clayborne Dana, NP  metFORMIN (GLUCOPHAGE) 1000 MG tablet Take 1 tablet (1,000 mg total) by mouth in the morning and at bedtime. 05/15/22   Clayborne Dana, NP  metoprolol succinate (TOPROL XL) 50 MG 24 hr tablet Take 1 tablet (50 mg total) by mouth daily. 04/15/22   [name], MD  Multiple Vitamin (MULTIVITAMIN) tablet Take 1 tablet by mouth daily.    [provider]  nitroGLYCERIN (NITROSTAT) 0.4 MG SL tablet Place 1 tablet (0.4 mg total) under the tongue every 5 (five) minutes x 3 doses as needed. 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  Tobacco Use   Smoking status: Former    Current packs/day: 0.00    Types: Cigarettes    Quit date: 01/16/2020    Years since quitting: 3.0   Smokeless tobacco: Never  Vaping Use   Vaping status: Never Used  Substance and Sexual Activity   Alcohol use: Yes    Alcohol/week: 4.0 - 6.0 standard drinks of alcohol    Types: 3 - 4 Glasses of wine, 1 - 2 Shots of liquor per week   Drug use: Never   Sexual activity: Not Currently  Other Topics Concern   Not on file  Social History Narrative   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   Financial Resource Strain: Low Risk  (10/22/2022)   Overall Financial Resource Strain (CARDIA)    Difficulty of Paying Living Expenses: Not very hard  Food Insecurity: Unknown (10/22/2022)   Hunger Vital Sign    Worried About Running Out of Food in the Last Year: Patient declined    Ran Out of Food in the Last Year: Never true  Transportation Needs: No Transportation Needs (10/22/2022)   PRAPARE - Administrator, Civil Service (Medical): No    Lack of Transportation (Non-Medical): No  Physical Activity: Insufficiently Active (10/22/2022)   Exercise Vital Sign    Days of Exercise per Week: 2 days    Minutes of Exercise per Session: 20 min  Stress: Stress Concern Present (10/22/2022)   [employer] of Occupational Health - Occupational Stress Questionnaire    Feeling of Stress : To some extent  Social Connections:  Unknown (10/22/2022)   Social Connection and Isolation Panel [NHANES]    Frequency of Communication with Friends and Family: Three times a week    Frequency of Social Gatherings with Friends and Family: Twice a week    Attends Religious Services: Patient declined    Database administrator or Organizations: No    Attends Engineer, structural: Not on file    Marital Status: Widowed    ECOG Status: 1 - Symptomatic but completely ambulatory  Review of Systems  Review of Systems: A 12 point ROS discussed and pertinent positives are indicated in the HPI above.  All other systems are negative.  Physical Exam No direct physical exam was performed (except for noted visual exam findings  with Video Visits).    Vital Signs: There were no vitals taken for this visit.  Imaging: IR KYPHO THORACIC WITH BONE BIOPSY  Addendum Date: 01/14/2023   ADDENDUM REPORT: 01/14/2023 16:13 ADDENDUM: Intravenous Fentanyl and Versed 3mg  were administered by RN during a total moderate (conscious) sedation time of 21 minutes; the patient's level of consciousness and physiological / cardiorespiratory status were monitored continuously by radiology RN under my direct supervision. Electronically Signed   By: Corlis Leak M.D.   On: 01/14/2023 16:13   Result Date: 01/14/2023 CLINICAL DATA:  Painful subacute T8 compression fracture with benign appearance on MRI. EXAM: KYPHOPLASTY AT THORACIC T8 TECHNIQUE: The procedure, risks (including but not limited to bleeding, infection, organ damage), benefits, and alternatives were explained to the patient. Questions regarding the procedure were encouraged and answered. The patient understands and consents to the procedure. The patient was placed prone on the fluoroscopic table. The skin overlying the midthoracic region was then prepped and draped in the usual sterile fashion. Maximal barrier sterile technique was utilized including caps, mask, sterile gowns, sterile gloves,  sterile drape, hand hygiene and skin antiseptic. Intravenous Fentanyl and Versed 3mg  were administered as conscious sedation during continuous monitoring of the patient's level of consciousness and physiological / cardiorespiratory status by the radiology RN, with a total moderate sedation time of 21 minutes. Patient received Toradol 30 mg for pain relief. As antibiotic prophylaxis, !cefazolin 1 gram! was ordered pre-procedure and administered intravenously within !one hour! of incision. The right pedicle at thoracic T8 was then infiltrated with 1% lidocaine followed by the advancement of a Kyphon trocar needle through the right pedicle into the posterior one-third. The trocar was removed and the osteo drill was advanced to the anterior third of the vertebral body. The osteo drill was retracted. Through the working cannula, a Kyphon inflatable bone tamp was advanced and positioned with the distal marker 5 mm from the anterior aspect of the cortex. Crossing of the midline was seen on the AP projection. At this time, the balloon was expanded using contrast via a Kyphon inflation syringe device via micro tubing. Inflation continued until there was near apposition across the midline and with the superior end plate. At this time, methylmethacrylate mixture was reconstituted in the Kyphon bone mixing device system. This was then loaded into the delivery mechanism, attached to Kyphon bone fillers. The balloon was deflated and removed followed by the instillation of methylmethacrylate mixture with excellent filling in the AP and lateral projections. No extravasation was noted in the disk spaces or posteriorly into the spinal canal. No epidural venous contamination was seen. The patient tolerated the procedure well. There were no acute complications. The working cannulae and the bone filler were then retrieved and removed. COMPLICATIONS: COMPLICATIONS None immediate. IMPRESSION: 1. Status post vertebral body  augmentation using balloon kyphoplasty at thoracic T8 as described without event. 2. Per CMS reporting requirements (MIPS measure 24): Given the patient's age of greater than 50 and the fracture site (hip, distal radius, or spine), the patient should be tested for osteoporosis using DXA, and the appropriate treatment considered based on the DXA results. Electronically Signed: By: Corlis Leak M.D. On: 01/01/2023 10:33   [place] FNA BX THYROID 1ST LESION AFIRMA  Result Date: 12/18/2022 INDICATION: Indeterminate thyroid nodule EXAM: ULTRASOUND GUIDED FINE NEEDLE ASPIRATION OF INDETERMINATE THYROID NODULE COMPARISON:  December 09, 2022 MEDICATIONS: None COMPLICATIONS: None immediate. TECHNIQUE: Informed written consent was obtained from the patient after a discussion of the risks,  benefits and alternatives to treatment. Questions regarding the procedure were encouraged and answered. A timeout was performed prior to the initiation of the procedure. Pre-procedural ultrasound scanning demonstrated unchanged size and appearance of the indeterminate nodule within the inferior left thyroid lobe. The procedure was planned. The neck was prepped in the usual sterile fashion, and a sterile drape was applied covering the operative field. A timeout was performed prior to the initiation of the procedure. Local anesthesia was provided with 1% lidocaine. Under direct ultrasound guidance, x5 FNA biopsies were performed of the nodule in the inferior left thyroid lobe with a 25 gauge needle. Multiple ultrasound images were saved for procedural documentation purposes. The samples were prepared and submitted to pathology. Limited post procedural scanning was negative for hematoma or additional complication. Dressings were placed. The patient tolerated the above procedures procedure well without immediate postprocedural complication. FINDINGS: Nodule reference number based on prior diagnostic ultrasound: 2 Maximum size: 6.5 cm Location:  Left; Inferior ACR TI-RADS risk category: TR3 (3 points) Reason for biopsy: meets ACR TI-RADS criteria Ultrasound imaging confirms appropriate placement of the needles within the thyroid nodule. IMPRESSION: Technically successful ultrasound guided fine needle aspiration of thyroid nodule in the inferior left thyroid lobe. Electronically Signed   By: Olive Bass M.D.   On: 12/18/2022 15:30    Labs:  CBC: Recent Labs    12/16/22 1025  WBC 7.8  HGB 14.9  HCT 45.0  PLT 223    COAGS: Recent Labs    12/16/22 1025  INR 1.0    BMP: Recent Labs    01/24/22 0834 07/31/22 0723 12/16/22 1025  NA 139 140 138  K 4.4 4.4 4.2  CL 102 103 105  CO2 28 28 24   GLUCOSE 186* 129* 193*  BUN 14 15 15   CALCIUM 9.8 9.8 9.7  CREATININE 0.74 0.72 0.70    LIVER FUNCTION TESTS: Recent Labs    01/24/22 0834 07/31/22 0723 12/16/22 1025  BILITOT 0.8 0.8 0.7  AST 18 17 18   ALT 25 20 21   ALKPHOS 74 64  --   PROT 7.5 6.9 7.0  ALBUMIN 4.6 4.3  --     TUMOR MARKERS: No results for input(s): "AFPTM", "CEA", "CA199", "CHROMGRNA" in the last 8760 hours.  Assessment and Plan:  My impression is that she is doing great 2 weeks post T8 kyphoplasty. Minimal residual pain. We discussed that rehabilitation takes at least  2x as long as her period of disability. She is working with the PCP office of Hyman Hopes NP on fosmamax bone building thereapy, which will be a long process. I do not require additional visits as long as she continues to improve. She know to call with any questions problems or concerns related to her spine procedure.   Thank you for this interesting consult.  I greatly enjoyed meeting Rebekha Diveley and look forward to participating in their care.  A copy of this report was sent to the requesting provider on this date.  Electronically Signed: Durwin Glaze 01/15/2023, 2:31 PM   I spent a total of    25 Minutes in remote  clinical consultation, greater than 50% of  which was counseling/coordinating care for thoracic compression fracture, post kyphoplasty.    Visit type: Audio only (telephone). Audio (no video) only due to patient's lack of internet/smartphone capability. Alternative for in-person consultation at [facility], [address] [place], [place], [place].  This format is felt to be most appropriate for this patient at this time.  All issues noted in this document were discussed and addressed.

## 2023-01-17 NOTE — Telephone Encounter (Signed)
There a way for you to do that?

## 2023-01-20 DIAGNOSIS — R293 Abnormal posture: Secondary | ICD-10-CM | POA: Diagnosis not present

## 2023-01-20 DIAGNOSIS — M546 Pain in thoracic spine: Secondary | ICD-10-CM | POA: Diagnosis not present

## 2023-01-21 ENCOUNTER — Ambulatory Visit: Payer: Self-pay | Admitting: Surgery

## 2023-01-21 NOTE — Progress Notes (Signed)
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  It extends into the mediastinum, but should be resectable from a cervical approach.  Will proceed with scheduling of a left thyroid lobectomy as we discussed in the office.  I will enter orders and send to schedulers to contact the patient.  tmg  Darnell Level, MD [facility] practice Office: [phone]

## 2023-01-22 DIAGNOSIS — R293 Abnormal posture: Secondary | ICD-10-CM | POA: Diagnosis not present

## 2023-01-22 DIAGNOSIS — M546 Pain in thoracic spine: Secondary | ICD-10-CM | POA: Diagnosis not present

## 2023-01-27 ENCOUNTER — Telehealth: Payer: Self-pay | Admitting: *Deleted

## 2023-01-27 ENCOUNTER — Encounter: Payer: Self-pay | Admitting: Cardiovascular Disease

## 2023-01-27 NOTE — Telephone Encounter (Signed)
   Pre-operative Risk Assessment    Patient Name: Christina Larsen  DOB: [birth year] MRN: [id number]  DATE OF LAST VISIT: 11/26/22 DR. KELLY DATE OF NEXT VISIT: 05/19/23 DR. KELLY    Request for Surgical Clearance    Procedure:   THYROID LOBECTOMY   Date of Surgery:  Clearance TBD                                 Surgeon:  DR.Darnell Level Surgeon's Group or Practice Name:  [facility] Phone number:  [phone] Fax number:  [phone] ATTN: Michel Bickers, LPN   Type of Clearance Requested:   - Medical  - Pharmacy:  Hold Clopidogrel (Plavix)     Type of Anesthesia:  General    Additional requests/questions:    Elpidio Anis   01/27/2023, 4:34 PM

## 2023-01-28 ENCOUNTER — Telehealth: Payer: Self-pay | Admitting: Family Medicine

## 2023-01-28 DIAGNOSIS — M546 Pain in thoracic spine: Secondary | ICD-10-CM | POA: Diagnosis not present

## 2023-01-28 DIAGNOSIS — R293 Abnormal posture: Secondary | ICD-10-CM | POA: Diagnosis not present

## 2023-01-28 NOTE — Telephone Encounter (Signed)
Pt dropped of papers to be completed by PCP. Pt asks that form be faxed to the number highlighted on the form. Pt wants these forms back for her records at home. Pt asks to be called when forms are done and have been faxed

## 2023-01-29 NOTE — Telephone Encounter (Signed)
Form completed, signed, and faxed to number provided. Copy at the front for patient pick up. Patient made aware. Copy to hold/to scan.

## 2023-01-31 DIAGNOSIS — M546 Pain in thoracic spine: Secondary | ICD-10-CM | POA: Diagnosis not present

## 2023-01-31 DIAGNOSIS — R293 Abnormal posture: Secondary | ICD-10-CM | POA: Diagnosis not present

## 2023-02-01 ENCOUNTER — Encounter: Payer: Self-pay | Admitting: Family Medicine

## 2023-02-01 NOTE — Telephone Encounter (Signed)
Care team updated and letter sent for eye exam notes.

## 2023-02-04 ENCOUNTER — Encounter: Payer: Self-pay | Admitting: Optometry

## 2023-02-04 DIAGNOSIS — M546 Pain in thoracic spine: Secondary | ICD-10-CM | POA: Diagnosis not present

## 2023-02-04 DIAGNOSIS — R293 Abnormal posture: Secondary | ICD-10-CM | POA: Diagnosis not present

## 2023-02-12 ENCOUNTER — Encounter: Payer: Self-pay | Admitting: Family Medicine

## 2023-02-12 DIAGNOSIS — E119 Type 2 diabetes mellitus without complications: Secondary | ICD-10-CM

## 2023-02-15 ENCOUNTER — Encounter: Payer: Self-pay | Admitting: Cardiovascular Disease

## 2023-02-17 DIAGNOSIS — Z1231 Encounter for screening mammogram for malignant neoplasm of breast: Secondary | ICD-10-CM | POA: Diagnosis not present

## 2023-02-17 LAB — HM MAMMOGRAPHY

## 2023-02-18 DIAGNOSIS — R293 Abnormal posture: Secondary | ICD-10-CM | POA: Diagnosis not present

## 2023-02-18 DIAGNOSIS — M546 Pain in thoracic spine: Secondary | ICD-10-CM | POA: Diagnosis not present

## 2023-02-25 ENCOUNTER — Other Ambulatory Visit (INDEPENDENT_AMBULATORY_CARE_PROVIDER_SITE_OTHER): Payer: PPO

## 2023-02-25 DIAGNOSIS — E119 Type 2 diabetes mellitus without complications: Secondary | ICD-10-CM

## 2023-02-25 LAB — COMPREHENSIVE METABOLIC PANEL
ALT: 18 U/L (ref 0–35)
AST: 16 U/L (ref 0–37)
Albumin: 4.1 g/dL (ref 3.5–5.2)
Alkaline Phosphatase: 71 U/L (ref 39–117)
BUN: 10 mg/dL (ref 6–23)
CO2: 29 meq/L (ref 19–32)
Calcium: 8.9 mg/dL (ref 8.4–10.5)
Chloride: 104 meq/L (ref 96–112)
Creatinine, Ser: 0.63 mg/dL (ref 0.40–1.20)
GFR: 90.5 mL/min (ref >60.00)
Glucose, Bld: 138 mg/dL — ABNORMAL HIGH (ref 70–99)
Potassium: 4.3 meq/L (ref 3.5–5.1)
Sodium: 141 meq/L (ref 135–145)
Total Bilirubin: 0.7 mg/dL (ref 0.2–1.2)
Total Protein: 6.7 g/dL (ref 6.0–8.3)

## 2023-02-25 LAB — CBC WITH DIFFERENTIAL/PLATELET
Basophils Absolute: 0.1 10*3/uL (ref 0.0–0.1)
Basophils Relative: 0.8 % (ref 0.0–3.0)
Eosinophils Absolute: 0.2 10*3/uL (ref 0.0–0.7)
Eosinophils Relative: 2.7 % (ref 0.0–5.0)
HCT: 43.6 % (ref 36.0–46.0)
Hemoglobin: 14.5 g/dL (ref 12.0–15.0)
Lymphocytes Relative: 35.6 % (ref 12.0–46.0)
Lymphs Abs: 2.5 10*3/uL (ref 0.7–4.0)
MCHC: 33.4 g/dL (ref 30.0–36.0)
MCV: 92.5 fL (ref 78.0–100.0)
Monocytes Absolute: 0.6 10*3/uL (ref 0.1–1.0)
Monocytes Relative: 8.7 % (ref 3.0–12.0)
Neutro Abs: 3.6 10*3/uL (ref 1.4–7.7)
Neutrophils Relative %: 52.2 % (ref 43.0–77.0)
Platelets: 216 10*3/uL (ref 150.0–400.0)
RBC: 4.71 Mil/uL (ref 3.87–5.11)
RDW: 14.3 % (ref 11.5–15.5)
WBC: 7 10*3/uL (ref 4.0–10.5)

## 2023-02-25 LAB — HEMOGLOBIN A1C: Hgb A1c MFr Bld: 8.3 % — ABNORMAL HIGH (ref 4.6–6.5)

## 2023-02-27 ENCOUNTER — Encounter: Payer: Self-pay | Admitting: Neurology

## 2023-03-03 NOTE — Progress Notes (Signed)
COVID Vaccine received:  []  No [x]  Yes Date of any COVID positive Test in last 90 days: no PCP - Hyman Hopes NP Cardiologist - [name] MD  Cardiac clearance -Edd Fabian NP-C 12/30/22  Chest x-ray -  EKG -  11/26/22 Epic Stress Test -  ECHO - 01/17/20 Epic Cardiac Cath - 01/17/20 Epic  Bowel Prep - [x]  No  []   Yes ______  Pacemaker / ICD device [x]  No []  Yes   Spinal Cord Stimulator:[x]  No []  Yes       History of Sleep Apnea? [x]  No []  Yes   CPAP used?- [x]  No []  Yes    Does the patient monitor blood sugar?          []  No [x]  Yes  []  N/A  Patient has: []  NO Hx DM   []  Pre-DM                 []  DM1  [x]   DM2 Does patient have a [employer] or Dexacom? []  No []  Yes   Fasting Blood Sugar Ranges- 140-177 Checks Blood Sugar _1____ times a day  GLP1 agonist / usual dose - Trulicity Last dose 03/09/23 GLP1 instructions:  SGLT-2 inhibitors / usual dose -  SGLT-2 instructions:   Blood Thinner / Instructions:Plavix Will stop 5 days prior to surgery. Last dose 03/11/23 per MD office Aspirin Instructions:Will stop 5 days prior to surgery also  Comments:   Activity level: Patient is able to climb a flight of stairs without difficulty; [x]  No CP  [x]  No SOB, ___   Patient can perform ADLs without assistance.   Anesthesia review: MI-Stent, DM2, HTN  Patient denies shortness of breath, fever, cough and chest pain at PAT appointment.  Patient verbalized understanding and agreement to the Pre-Surgical Instructions that were given to them at this PAT appointment. Patient was also educated of the need to review these PAT instructions again prior to his/her surgery.I reviewed the appropriate phone numbers to call if they have any and questions or concerns.

## 2023-03-03 NOTE — Patient Instructions (Signed)
SURGICAL WAITING ROOM VISITATION  Patients having surgery or a procedure may have no more than 2 support people in the waiting area - these visitors may rotate.    Children under the age of 65 must have an adult with them who is not the patient.  Due to an increase in RSV and influenza rates and associated hospitalizations, children ages 36 and under may not visit patients in [facility] hospitals.  If the patient needs to stay at the hospital during part of their recovery, the visitor guidelines for inpatient rooms apply. Pre-op nurse will coordinate an appropriate time for 1 support person to accompany patient in pre-op.  This support person may not rotate.    Please refer to the [facility] website for the visitor guidelines for Inpatients (after your surgery is over and you are in a regular room).       Your procedure is scheduled on: 03/17/23   Report to [facility] Main Entrance    Report to admitting at  8:45 AM   Call this number if you have problems the morning of surgery [phone]   Do not eat food :After Midnight.   After Midnight you may have the following liquids until 8 am DAY OF SURGERY  Water Non-Citrus Juices (without pulp, NO RED-Apple, White grape, White cranberry) Black Coffee (NO MILK/CREAM OR CREAMERS, sugar ok)  Clear Tea (NO MILK/CREAM OR CREAMERS, sugar ok) regular and decaf                             Plain Jell-O (NO RED)                                           Fruit ices (not with fruit pulp, NO RED)                                     Popsicles (NO RED)                                                               Sports drinks like Gatorade (NO RED)                Oral Hygiene is also important to reduce your risk of infection.                                    Remember - BRUSH YOUR TEETH THE MORNING OF SURGERY WITH YOUR REGULAR TOOTHPASTE  DENTURES WILL BE REMOVED PRIOR TO SURGERY PLEASE DO NOT APPLY "Poly grip" OR ADHESIVES!!!   Do  NOT smoke after Midnight   Stop all vitamins and herbal supplements 7 days before surgery.   Take these medicines the morning of surgery with A SIP OF WATER: Amlodipine, tylenol as needed.  DO NOT TAKE ANY ORAL DIABETIC MEDICATIONS DAY OF YOUR SURGERY Hold Metformin the day of surgery. Hold Trulicity for at least 7 days prior to surgery.  You may not have any metal on your body including hair pins, jewelry, and body piercing             Do not wear make-up, lotions, powders, perfumes/cologne, or deodorant  Do not wear nail polish including gel and S&S, artificial/acrylic nails, or any other type of covering on natural nails including finger and toenails. If you have artificial nails, gel coating, etc. that needs to be removed by a nail salon please have this removed prior to surgery or surgery may need to be canceled/ delayed if the surgeon/ anesthesia feels like they are unable to be safely monitored.   Do not shave  48 hours prior to surgery.    Do not bring valuables to the hospital. [facility] IS NOT             RESPONSIBLE   FOR VALUABLES.   Contacts, glasses, dentures or bridgework may not be worn into surgery.   Bring small overnight bag day of surgery.   DO NOT BRING YOUR HOME MEDICATIONS TO THE HOSPITAL. PHARMACY WILL DISPENSE MEDICATIONS LISTED ON YOUR MEDICATION LIST TO YOU DURING YOUR ADMISSION IN THE HOSPITAL!    Patients discharged on the day of surgery will not be allowed to drive home.  Someone NEEDS to stay with you for the first 24 hours after anesthesia.   Special Instructions: Bring a copy of your healthcare power of attorney and living will documents the day of surgery if you haven't scanned them before.              Please read over the following fact sheets you were given: IF YOU HAVE QUESTIONS ABOUT YOUR PRE-OP INSTRUCTIONS PLEASE CALL [phone] [facility] [place]   If you received a COVID test during your pre-op visit  it is requested that you wear a  mask when out in public, stay away from anyone that may not be feeling well and notify your surgeon if you develop symptoms. If you test positive for Covid or have been in contact with anyone that has tested positive in the last 10 days please notify you surgeon.    [facility] - Preparing for Surgery Before surgery, you can play an important role.  Because skin is not sterile, your skin needs to be as free of germs as possible.  You can reduce the number of germs on your skin by washing with CHG (chlorahexidine gluconate) soap before surgery.  CHG is an antiseptic cleaner which kills germs and bonds with the skin to continue killing germs even after washing. Please DO NOT use if you have an allergy to CHG or antibacterial soaps.  If your skin becomes reddened/irritated stop using the CHG and inform your nurse when you arrive at Short Stay. Do not shave (including legs and underarms) for at least 48 hours prior to the first CHG shower.  You may shave your face/neck.  Please follow these instructions carefully:  1.  Shower with CHG Soap the night before surgery and the  morning of surgery.  2.  If you choose to wash your hair, wash your hair first as usual with your normal  shampoo.  3.  After you shampoo, rinse your hair and body thoroughly to remove the shampoo.                             4.  Use CHG as you would any other liquid soap.  You can apply chg directly  to the skin and wash.  Gently with a scrungie or clean washcloth.  5.  Apply the CHG Soap to your body ONLY FROM THE NECK DOWN.   Do   not use on face/ open                           Wound or open sores. Avoid contact with eyes, ears mouth and   genitals (private parts).                       Wash face,  Genitals (private parts) with your normal soap.             6.  Wash thoroughly, paying special attention to the area where your    surgery  will be performed.  7.  Thoroughly rinse your body with warm water from the neck down.  8.  DO  NOT shower/wash with your normal soap after using and rinsing off the CHG Soap.                9.  Pat yourself dry with a clean towel.            10.  Wear clean pajamas.            11.  Place clean sheets on your bed the night of your first shower and do not  sleep with pets. Day of Surgery : Do not apply any lotions/deodorants the morning of surgery.  Please wear clean clothes to the hospital/surgery center.  FAILURE TO FOLLOW THESE INSTRUCTIONS MAY RESULT IN THE CANCELLATION OF YOUR SURGERY  PATIENT SIGNATURE_________________________________  NURSE SIGNATURE__________________________________  ________________________________________________________________________

## 2023-03-06 ENCOUNTER — Encounter (HOSPITAL_COMMUNITY): Payer: Self-pay

## 2023-03-06 ENCOUNTER — Encounter (HOSPITAL_COMMUNITY)
Admission: RE | Admit: 2023-03-06 | Discharge: 2023-03-06 | Disposition: A | Discharge status: Home / Self Care | Payer: PPO | Source: Ambulatory Visit | Attending: Surgery | Admitting: Surgery

## 2023-03-06 ENCOUNTER — Other Ambulatory Visit: Payer: Self-pay

## 2023-03-06 VITALS — BP 151/93 | HR 67 | Temp 98.3°F | Resp 16 | Ht 65.0 in | Wt 155.0 lb

## 2023-03-06 DIAGNOSIS — I2510 Atherosclerotic heart disease of native coronary artery without angina pectoris: Secondary | ICD-10-CM | POA: Diagnosis not present

## 2023-03-06 DIAGNOSIS — Z87891 Personal history of nicotine dependence: Secondary | ICD-10-CM | POA: Insufficient documentation

## 2023-03-06 DIAGNOSIS — I10 Essential (primary) hypertension: Secondary | ICD-10-CM | POA: Diagnosis not present

## 2023-03-06 DIAGNOSIS — E041 Nontoxic single thyroid nodule: Secondary | ICD-10-CM | POA: Diagnosis not present

## 2023-03-06 DIAGNOSIS — I252 Old myocardial infarction: Secondary | ICD-10-CM | POA: Diagnosis not present

## 2023-03-06 DIAGNOSIS — Z01812 Encounter for preprocedural laboratory examination: Secondary | ICD-10-CM | POA: Insufficient documentation

## 2023-03-06 DIAGNOSIS — E119 Type 2 diabetes mellitus without complications: Secondary | ICD-10-CM | POA: Insufficient documentation

## 2023-03-06 HISTORY — DX: Other complications of anesthesia, initial encounter: T88.59XA

## 2023-03-06 LAB — GLUCOSE, CAPILLARY: Glucose-Capillary: 131 mg/dL — ABNORMAL HIGH (ref 70–99)

## 2023-03-07 ENCOUNTER — Encounter (HOSPITAL_COMMUNITY): Payer: Self-pay

## 2023-03-07 NOTE — Progress Notes (Signed)
Recommend moving case to [facility] d/t significant tracheal deviation w/ some compression as well as extension into the mediastinum. Case is safer to be done in a facility with cardiothoracic surgery available.

## 2023-03-07 NOTE — Progress Notes (Signed)
DISCUSSION: Christina Larsen is a 67 yo female who presents to PAT prior to LEFT THYROID LOBECTOMY on 03/17/23 with Dr. Gerrit Larsen for goiter with tracheal deviation. PMH of former smoking (quit 2021), hx of NSTEMI (01/2020), hx of CAD s/p PCI (01/2020), HTN, DM, recent T8 fx s/p kyphoplasty.  Patient injured her back and sustained a T8 compression fx in 11/2022. She is now s/p kyphoplasty by IR on 01/01/23. Of note, she has poor tolerance of oral narcotics which cause N/V (Tramadol and Norco)  Incidentally, there were thyroid nodules found when imaging was obtained for her back. She underwent biopsy of the nodules which were benign but there is also a goiter noted causing tracheal deviation. CT Neck was obtained on 01/07/23 which showed rightward displacement of the trachea and esophagus however airway is patent. Discussed with Dr. Salvadore Farber who recommends case be done at [facility] due to tracheal deviation and since mass extends in to mediastinum.  Patient had an NSTEMI in 01/2020 and subsequently required aspiration thrombectomy for significant clot burden and underwent successful stenting of her proximal circumflex vessel. She completed cardiac rehab. She has had a sleep study in the past which was negative for OSA. She was last seen in clinic on 11/26/22 and tolerating meds. She remains on Plavix for residual CAD that did not undergo PCI. Takes a statin and BB. She was cleared for surgery on 12/30/22:  "Chart reviewed as part of pre-operative protocol coverage. Given past medical history and time since last visit, based on ACC/AHA guidelines, Christina Larsen would be at acceptable risk for the planned procedure without further cardiovascular testing.  His RCRI is moderate risk 6.6% risk of major cardiac event. His Plavix may be held for 5 days prior to his surgery.  Please resume as soon as hemostasis is achieved.  His aspirin may be held for 5 to 7 days prior to his procedure.  Please resume as soon as hemostasis is  achieved."  Last day of Plavix: 03/11/23 Last day for GLP-1: 03/09/23  VS: BP (!) 151/93   Pulse 67   Temp 36.8 C (Oral)   Resp 16   Ht 5\' 5"  (1.651 m)   Wt 70.3 kg   SpO2 97%   BMI 25.79 kg/m   PROVIDERS: Clayborne Dana, NP Cardiologist:  [name], MD   LABS: Labs reviewed: Acceptable for surgery. (all labs ordered are listed, but only abnormal results are displayed)  Labs Reviewed  GLUCOSE, CAPILLARY - Abnormal; Notable for the following components:      Result Value   Glucose-Capillary 131 (*)    All other components within normal limits     IMAGES:  CT neck 01/07/23:   IMPRESSION: 1. Heterogeneous enlargement of the left thyroid lobe, unchanged from prior ultrasound and recently biopsied, with 3.5 cm of substernal extension and associated rightward displacement of the trachea and esophagus. Airway remains patent. 2. No suspicious cervical lymphadenopathy.   EKG:   CV:  LHC 01/17/2020:  Conclusions: Significant two-vessel coronary artery disease with occlusions of the proximal LCx (acute with heavy thrombus burden) and mid RCA (chronic with left-to-right collaterals).  Moderate, non-obstructive coronary artery disease is also present in the ramus intermedius and distal LCx (codominant vessel). Mid LAD myocardial bridge. Mildly elevated left ventricular filling pressure with lateral wall hypokinesis and mildly reduced contraction. Successful PCI to proximal LCx with aspiration thrombectomy and placement of Resolute Onyx 3.0 x 18 mm drug-eluting stent (postdilated to 3.3 mm) with 0% residual stenosis and TIMI-3 flow.  Recommendations: Dual antiplatelet therapy with aspirin and ticagrelor for at least 12 months. Aggressive secondary prevention, including high-intensity statin therapy and smoking cessation. Medical management of chronic total occlusion of mid RCA as well as moderate ramus/distal LCx disease. Remove right femoral artery sheath with manual  compression two hours after discontinuation of bivalirudin.  Echo 01/17/2020:  IMPRESSIONS    1. Left ventricular ejection fraction, by estimation, is 55%. The left ventricle has normal function. The left ventricle has no regional wall motion abnormalities. Left ventricular diastolic parameters were normal.  2. Right ventricular systolic function is normal. The right ventricular size is normal.  3. The mitral valve is normal in structure. No evidence of mitral valve regurgitation. No evidence of mitral stenosis.  4. The aortic valve is normal in structure. Aortic valve regurgitation is not visualized. Mild aortic valve sclerosis is present, with no evidence of aortic valve stenosis.  5. Aortic dilatation noted. There is mild dilatation of the ascending aorta, measuring 40 mm.  6. The inferior vena cava is normal in size with greater than 50% respiratory variability, suggesting right atrial pressure of 3 mmHg.  Past Medical History:  Diagnosis Date   Allergy    Complication of anesthesia    Coronary artery disease    11/9 NSTEMI, PCI to pLCx with aspiration thrombectomy/DESx1, occluded mRCA with left to right collaterals   Diabetes mellitus without complication (HCC)    HLD (hyperlipidemia)    Hypertension    Myocardial infarction [facility])     Past Surgical History:  Procedure Laterality Date   CARDIAC CATHETERIZATION     CHOLECYSTECTOMY  1992   CORONARY STENT INTERVENTION N/A 01/17/2020   Procedure: CORONARY STENT INTERVENTION;  Surgeon: Yvonne Kendall, MD;  Location: [facility] INVASIVE CV LAB;  Service: Cardiovascular;  Laterality: N/A;   IR KYPHO THORACIC WITH BONE BIOPSY  01/01/2023   LAMINECTOMY  2006   LEFT HEART CATH AND CORONARY ANGIOGRAPHY N/A 01/17/2020   Procedure: LEFT HEART CATH AND CORONARY ANGIOGRAPHY;  Surgeon: Yvonne Kendall, MD;  Location: [facility] INVASIVE CV LAB;  Service: Cardiovascular;  Laterality: N/A;   rotator cuff surgery      MEDICATIONS:  acetaminophen  (TYLENOL) 500 MG tablet   alendronate (FOSAMAX) 70 MG tablet   aspirin EC 81 MG tablet   atorvastatin (LIPITOR) 80 MG tablet   cholecalciferol (VITAMIN D3) 25 MCG (1000 UNIT) tablet   clopidogrel (PLAVIX) 75 MG tablet   Dulaglutide (TRULICITY) 4.5 MG/0.5ML SOPN   gabapentin (NEURONTIN) 300 MG capsule   metFORMIN (GLUCOPHAGE) 1000 MG tablet   metoprolol succinate (TOPROL XL) 50 MG 24 hr tablet   Multiple Vitamin (MULTIVITAMIN) tablet   nitroGLYCERIN (NITROSTAT) 0.4 MG SL tablet   No current facility-administered medications for this encounter.   Marcille Blanco [facility] Surgical Short Stay/Anesthesiology [facility] Phone [phone] 03/07/2023 10:09 AM

## 2023-03-14 ENCOUNTER — Other Ambulatory Visit: Payer: Self-pay

## 2023-03-14 ENCOUNTER — Encounter (HOSPITAL_COMMUNITY): Payer: Self-pay | Admitting: Surgery

## 2023-03-14 NOTE — Anesthesia Preprocedure Evaluation (Addendum)
 Anesthesia Evaluation  Patient identified by MRN, date of birth, ID band Patient awake    Reviewed: Allergy & Precautions, NPO status , Patient's Chart, lab work & pertinent test results  History of Anesthesia Complications (+) PONV and history of anesthetic complications  Airway Mallampati: III  TM Distance: >3 FB Neck ROM: Full    Dental no notable dental hx. (+) Teeth Intact, Dental Advisory Given   Pulmonary former smoker   Pulmonary exam normal breath sounds clear to auscultation       Cardiovascular hypertension, Pt. on home beta blockers and Pt. on medications + CAD, + Past MI and + Cardiac Stents  Normal cardiovascular exam Rhythm:Regular Rate:Normal     Neuro/Psych negative neurological ROS  negative psych ROS   GI/Hepatic negative GI ROS, Neg liver ROS,,,  Endo/Other  diabetes, Type 2, Oral Hypoglycemic Agents    Renal/GU negative Renal ROS  negative genitourinary   Musculoskeletal negative musculoskeletal ROS (+)    Abdominal   Peds  Hematology plavix    Anesthesia Other Findings 1. Heterogeneous enlargement of the left thyroid  lobe, unchanged from prior ultrasound and recently biopsied, with 3.5 cm of substernal extension and associated rightward displacement of the trachea and esophagus. Airway remains patent. 2. No suspicious cervical lymphadenopathy   Reproductive/Obstetrics                             Anesthesia Physical Anesthesia Plan  ASA: 3  Anesthesia Plan: General   Post-op Pain Management: Tylenol  PO (pre-op)*   Induction: Intravenous  PONV Risk Score and Plan: 4 or greater and Midazolam , Dexamethasone , Ondansetron , Treatment may vary due to age or medical condition and TIVA  Airway Management Planned: Oral ETT and Video Laryngoscope Planned  Additional Equipment:   Intra-op Plan:   Post-operative Plan: Extubation in OR  Informed Consent: I have  reviewed the patients History and Physical, chart, labs and discussed the procedure including the risks, benefits and alternatives for the proposed anesthesia with the patient or authorized representative who has indicated his/her understanding and acceptance.     Dental advisory given  Plan Discussed with: CRNA  Anesthesia Plan Comments: (Anesthesia APP note done by Burnard Senna, PA-C. Cased moved to [facility] Main OR from [facility] OR. Per Dr. Merla, ecommend moving case to [facility] d/t significant tracheal deviation w/ some compression as well as extension into the mediastinum. Case is safer to be done in a facility with cardiothoracic surgery available.  2 IVs)        Anesthesia Quick Evaluation

## 2023-03-14 NOTE — Progress Notes (Signed)
 PCP - Waddell Mon, NP Cardiologist - Dr Debby Sor ([facility] on 12/30/22)  Chest x-ray - 05/07/20 EKG - 11/26/22 Stress Test - n/a ECHO - 01/17/20 Cardiac Cath - 01/17/20  ICD Pacemaker/Loop - n/a  Sleep Study -  Yes (10/2020) CPAP - none  Diabetes Type 2 Fasting Blood Sugar - 140s-150s Checks Blood Sugar ___1__ times a day  Last dose of GLP1 agonist-  Last dose of Trulicity  was on Sunday, 03/09/23.   Do not take Metformin  on the morning of surgery.  If your blood sugar is less than 70 mg/dL, you will need to treat for low blood sugar: Do not take insulin . Treat a low blood sugar (less than 70 mg/dL) with  cup of clear juice (cranberry or apple), 4 glucose tablets, OR glucose gel. Recheck blood sugar in 15 minutes after treatment (to make sure it is greater than 70 mg/dL). If your blood sugar is not greater than 70 mg/dL on recheck, call [phone] for further instructions.  Nitroglycerin  - If you have to take this medication prior to surgery, please call [phone] and report this to a nurse   Blood Thinner Instructions:  Follow your surgeon's instructions on when to stop Plavix  5 days prior to surgery.  Last dose was on 03/11/23.  Aspirin  Instructions: Follow your surgeon's instructions on when to stop Aspirin  5 days prior to surgery,  Last dose was on 03/11/23.  ERAS - Clear liquids til 8:30 AM DOS.  Anesthesia review: Yes  STOP now taking any Aspirin  (unless otherwise instructed by your surgeon), Aleve, Naproxen, Ibuprofen, Motrin, Advil, Goody's, BC's, all herbal medications, fish oil, and all vitamins.   Coronavirus Screening Do you have any of the following symptoms:  Cough yes/no: No Fever (>100.68F)  yes/no: No Runny nose yes/no: No Sore throat yes/no: No Difficulty breathing/shortness of breath  yes/no: No  Have you traveled in the last 14 days and where? yes/no: No  Patient verbalized understanding of instructions that were given via phone.

## 2023-03-16 ENCOUNTER — Encounter (HOSPITAL_COMMUNITY): Payer: Self-pay | Admitting: Surgery

## 2023-03-16 DIAGNOSIS — E042 Nontoxic multinodular goiter: Secondary | ICD-10-CM | POA: Diagnosis present

## 2023-03-16 DIAGNOSIS — J398 Other specified diseases of upper respiratory tract: Secondary | ICD-10-CM | POA: Diagnosis present

## 2023-03-16 DIAGNOSIS — E049 Nontoxic goiter, unspecified: Secondary | ICD-10-CM | POA: Diagnosis present

## 2023-03-16 NOTE — H&P (Signed)
 REFERRING PHYSICIAN: Almarie Waddell Benders, NP  PROVIDER: Tyera Hansley OZELL SPINNER, MD   Chief Complaint: New Consultation (Thyroid  nodule)  History of Present Illness:  Patient is referred by her primary care provider, Waddell Almarie, NP, for surgical evaluation and recommendations regarding newly identified thyroid  nodules. Patient had undergone imaging related to a compression fracture of the thoracic spine. Incidental finding was made of a thyroid  nodule. Patient subsequently underwent an ultrasound on December 13, 2022. This demonstrated a normal right thyroid  lobe containing a small 9 mm nodule and a large inferior left thyroid  nodule measuring 6.5 x 3.0 x 4.3 cm and extending into the upper mediastinum. Fine-needle aspiration biopsy was performed and cytopathology demonstrated a benign follicular nodule, Bethesda category II. Patient has no history of thyroid  disease. She has never been on thyroid  medication. She has had no prior head or neck surgery. Patient has noted some issues with intermittent dysphagia. This is mainly with solid food. She has also noted some changes in her voice quality. Patient has also developed a mild globus sensation. Patient presents today to discuss management. Recent TSH level is normal at 0.76.  Patient also has a history of coronary artery disease and is followed by Dr. Debby Sor.  Today I reviewed her MRI scan. While the images are somewhat limited, there does appear to be a significant size substernal mass on the left which is causing tracheal deviation and perhaps some degree of tracheal compression.  Review of Systems: A complete review of systems was obtained from the patient. I have reviewed this information and discussed as appropriate with the patient. See HPI as well for other ROS.  Review of Systems  Constitutional: Negative.  HENT:  Globus sensation Change in voice quality  Eyes: Negative.  Respiratory: Negative.  Cardiovascular: Negative.   Gastrointestinal:  Dysphagia  Genitourinary: Negative.  Musculoskeletal: Negative.  Skin: Negative.  Neurological: Negative.  Endo/Heme/Allergies: Negative.  Psychiatric/Behavioral: Negative.    Medical History: Past Medical History:  Diagnosis Date  Diabetes mellitus without complication (CMS/HHS-HCC)  Hyperlipidemia  Hypertension   Patient Active Problem List  Diagnosis  NSTEMI (non-ST elevated myocardial infarction) (CMS/HHS-HCC)  Coronary artery disease  Type 2 diabetes mellitus (CMS/HHS-HCC)  Multiple thyroid  nodules  Substernal thyroid  goiter   Past Surgical History:  Procedure Laterality Date  CHOLECYSTECTOMY 1992  coronary stent Left 2021  CHOLECYSTECTOMY    Allergies  Allergen Reactions  Hydrocodone-Acetaminophen  Hives  Pt gets sick- vomiting   No current outpatient medications on file prior to visit.   No current facility-administered medications on file prior to visit.   Family History  Problem Relation Age of Onset  Diabetes Mother  Colon cancer Mother  Hyperlipidemia (Elevated cholesterol) Father  Heart disease Father  Prostate cancer Father  High blood pressure (Hypertension) Brother  Hyperlipidemia (Elevated cholesterol) Brother  Diabetes Brother  Cancer Brother    Social History   Tobacco Use  Smoking Status Former  Types: Cigarettes  Start date: 2021  Smokeless Tobacco Never    Social History   Socioeconomic History  Marital status: Widowed  Tobacco Use  Smoking status: Former  Types: Cigarettes  Start date: 2021  Smokeless tobacco: Never  Substance and Sexual Activity  Alcohol  use: Yes  Alcohol /week: 3.0 standard drinks of alcohol   Types: 3 Glasses of wine per week  Drug use: Never   Social Drivers of Health   Financial Resource Strain: Low Risk (10/22/2022)  Received from [facility]  Overall Financial Resource Strain (CARDIA)  Difficulty of  Paying Living Expenses: Not very hard  Food Insecurity: Unknown  (10/22/2022)  Received from [facility]  Hunger Vital Sign  Worried About Running Out of Food in the Last Year: Patient declined  Ran Out of Food in the Last Year: Never true  Transportation Needs: No Transportation Needs (10/22/2022)  Received from [facility] - Transportation  Lack of Transportation (Medical): No  Lack of Transportation (Non-Medical): No  Physical Activity: Insufficiently Active (10/22/2022)  Received from [facility]  Exercise Vital Sign  Days of Exercise per Week: 2 days  Minutes of Exercise per Session: 20 min  Stress: Stress Concern Present (10/22/2022)  Received from [facility] - Occupational Stress Questionnaire  Feeling of Stress : To some extent  Social Connections: Unknown (10/22/2022)  Received from [facility]  Social Connection and Isolation Panel [NHANES]  Frequency of Communication with Friends and Family: Three times a week  Frequency of Social Gatherings with Friends and Family: Twice a week  Attends Religious Services: Patient declined  Active Member of Clubs or Organizations: No  Marital Status: Widowed   Objective:   Vitals:  BP: (!) 145/80  Pulse: 97  Temp: 36.7 C (98 F)  SpO2: 95%  Weight: 71.7 kg (158 lb)  Height: 165.1 cm (5' 5)   Body mass index is 26.29 kg/m.  Physical Exam   GENERAL APPEARANCE Comfortable, no acute issues Development: normal Gross deformities: none  SKIN Rash, lesions, ulcers: none Induration, erythema: none Nodules: none palpable  EYES Conjunctiva and lids: normal Pupils: equal and reactive  EARS, NOSE, MOUTH, THROAT External ears: no lesion or deformity External nose: no lesion or deformity Hearing: grossly normal  NECK Symmetric: yes Trachea: midline Thyroid : no palpable nodules in the thyroid  bed  ABDOMEN Not assessed  GENITOURINARY/RECTAL Not assessed  MUSCULOSKELETAL Station and gait: normal Digits and nails: no clubbing or  cyanosis Muscle strength: grossly normal all extremities Range of motion: grossly normal all extremities Deformity: none  LYMPHATIC Cervical: none palpable Supraclavicular: none palpable  PSYCHIATRIC Oriented to person, place, and time: yes Mood and affect: normal for situation Judgment and insight: appropriate for situation   Assessment and Plan:   Multiple thyroid  nodules Substernal thyroid  goiter  Patient is referred by her primary care provider for surgical evaluation and recommendations regarding a newly diagnosed left thyroid  nodule.  Patient provided with a copy of The Thyroid  Book: Medical and Surgical Treatment of Thyroid  Problems, published by [employer], 16 pages. Book reviewed and explained to patient during visit today.  Today we reviewed her clinical history. We discussed her symptoms. We reviewed her ultrasound as well as her cytopathology results. I also reviewed her MRI scan. Patient has a small nodule in the right thyroid  lobe which is likely not of any significance. However there is a large 6.5 cm nodule in the lower left thyroid  lobe which extends into the mediastinum and appears to be causing tracheal deviation and possible tracheal compression. Fine-needle aspiration cytopathology was benign. Patient does have some compressive symptoms including dysphagia, globus sensation, and possible voice quality alteration.  I would like to obtain a CT scan of the neck and chest with contrast to better evaluate the size and extent of this mass and to see what degree of tracheal deviation and possible tracheal compression is present. Today we discussed the possibility of thyroid  surgery, either left thyroid  lobectomy versus total thyroidectomy. We discussed the risk and benefits of the procedure including the risk of recurrent  laryngeal nerve injury. We discussed the size and location of the surgical incision. We discussed the hospital stay to be anticipated. We discussed the  possible need for thyroid  hormone supplementation or replacement postoperatively.  The patient understands and would like to proceed with further imaging. We will make arrangements for the CT scan at a time convenient for her in the near future. She is scheduled undergo kyphoplasty next week.  I will contact the patient with the results of her CT scan and we will make plans for further management pending those results.   Krystal Spinner, MD [facility] practice Office: [phone]

## 2023-03-17 ENCOUNTER — Ambulatory Visit (HOSPITAL_COMMUNITY): Payer: Self-pay | Admitting: Vascular Surgery

## 2023-03-17 ENCOUNTER — Ambulatory Visit (HOSPITAL_COMMUNITY)
Admission: RE | Admit: 2023-03-17 | Discharge: 2023-03-18 | Disposition: A | Discharge status: Home / Self Care | Payer: PPO | Attending: Surgery | Admitting: Surgery

## 2023-03-17 ENCOUNTER — Other Ambulatory Visit: Payer: Self-pay

## 2023-03-17 ENCOUNTER — Encounter (HOSPITAL_COMMUNITY)
Admission: RE | Disposition: A | Discharge status: Home / Self Care | Payer: Self-pay | Source: Home / Self Care | Attending: Surgery

## 2023-03-17 ENCOUNTER — Ambulatory Visit (HOSPITAL_BASED_OUTPATIENT_CLINIC_OR_DEPARTMENT_OTHER): Payer: Self-pay | Admitting: Vascular Surgery

## 2023-03-17 ENCOUNTER — Encounter (HOSPITAL_COMMUNITY): Payer: Self-pay | Admitting: Surgery

## 2023-03-17 DIAGNOSIS — J398 Other specified diseases of upper respiratory tract: Secondary | ICD-10-CM | POA: Diagnosis present

## 2023-03-17 DIAGNOSIS — I2510 Atherosclerotic heart disease of native coronary artery without angina pectoris: Secondary | ICD-10-CM | POA: Insufficient documentation

## 2023-03-17 DIAGNOSIS — E049 Nontoxic goiter, unspecified: Secondary | ICD-10-CM | POA: Diagnosis not present

## 2023-03-17 DIAGNOSIS — Z955 Presence of coronary angioplasty implant and graft: Secondary | ICD-10-CM | POA: Diagnosis not present

## 2023-03-17 DIAGNOSIS — Z7984 Long term (current) use of oral hypoglycemic drugs: Secondary | ICD-10-CM | POA: Diagnosis not present

## 2023-03-17 DIAGNOSIS — I252 Old myocardial infarction: Secondary | ICD-10-CM | POA: Diagnosis not present

## 2023-03-17 DIAGNOSIS — E119 Type 2 diabetes mellitus without complications: Secondary | ICD-10-CM | POA: Diagnosis not present

## 2023-03-17 DIAGNOSIS — E042 Nontoxic multinodular goiter: Secondary | ICD-10-CM | POA: Diagnosis present

## 2023-03-17 DIAGNOSIS — Z87891 Personal history of nicotine dependence: Secondary | ICD-10-CM | POA: Diagnosis not present

## 2023-03-17 DIAGNOSIS — I10 Essential (primary) hypertension: Secondary | ICD-10-CM

## 2023-03-17 DIAGNOSIS — E041 Nontoxic single thyroid nodule: Secondary | ICD-10-CM | POA: Diagnosis not present

## 2023-03-17 HISTORY — PX: THYROID LOBECTOMY: SHX420

## 2023-03-17 HISTORY — DX: Nausea with vomiting, unspecified: R11.2

## 2023-03-17 HISTORY — DX: Other specified postprocedural states: Z98.890

## 2023-03-17 LAB — GLUCOSE, CAPILLARY
Glucose-Capillary: 179 mg/dL — ABNORMAL HIGH (ref 70–99)
Glucose-Capillary: 131 mg/dL — ABNORMAL HIGH (ref 70–99)
Glucose-Capillary: 126 mg/dL — ABNORMAL HIGH (ref 70–99)
Glucose-Capillary: 290 mg/dL — ABNORMAL HIGH (ref 70–99)

## 2023-03-17 SURGERY — LOBECTOMY, THYROID
Anesthesia: General | Laterality: Left

## 2023-03-17 MED ORDER — HYDROMORPHONE HCL 1 MG/ML IJ SOLN
1.0000 mg | INTRAMUSCULAR | Status: DC | PRN
  Administered 2023-03-17: 1 mg via INTRAVENOUS
  Filled 2023-03-17: qty 1

## 2023-03-17 MED ORDER — PROPOFOL 500 MG/50ML IV EMUL
INTRAVENOUS | Status: DC | PRN
  Administered 2023-03-17: 150 ug/kg/min via INTRAVENOUS

## 2023-03-17 MED ORDER — DEXAMETHASONE SODIUM PHOSPHATE 10 MG/ML IJ SOLN
INTRAMUSCULAR | Status: AC
  Filled 2023-03-17: qty 1

## 2023-03-17 MED ORDER — PROPOFOL 10 MG/ML IV BOLUS
INTRAVENOUS | Status: DC | PRN
  Administered 2023-03-17: 130 mg via INTRAVENOUS

## 2023-03-17 MED ORDER — FENTANYL CITRATE (PF) 250 MCG/5ML IJ SOLN
INTRAMUSCULAR | Status: AC
  Filled 2023-03-17: qty 5

## 2023-03-17 MED ORDER — FENTANYL CITRATE (PF) 250 MCG/5ML IJ SOLN
INTRAMUSCULAR | Status: DC | PRN
  Administered 2023-03-17: 50 ug via INTRAVENOUS
  Administered 2023-03-17: 100 ug via INTRAVENOUS

## 2023-03-17 MED ORDER — OXYCODONE HCL 5 MG PO TABS
5.0000 mg | ORAL_TABLET | ORAL | Status: DC | PRN
  Administered 2023-03-17 (×2): 5 mg via ORAL
  Filled 2023-03-17: qty 1

## 2023-03-17 MED ORDER — TRAMADOL HCL 50 MG PO TABS
50.0000 mg | ORAL_TABLET | Freq: Four times a day (QID) | ORAL | Status: DC | PRN
  Filled 2023-03-17: qty 1

## 2023-03-17 MED ORDER — ACETAMINOPHEN 650 MG RE SUPP: 650.0000 mg | Freq: Four times a day (QID) | RECTAL | Status: DC | PRN

## 2023-03-17 MED ORDER — FENTANYL CITRATE (PF) 100 MCG/2ML IJ SOLN
INTRAMUSCULAR | Status: AC
  Filled 2023-03-17: qty 2

## 2023-03-17 MED ORDER — GABAPENTIN 300 MG PO CAPS
300.0000 mg | ORAL_CAPSULE | Freq: Every day | ORAL | Status: DC
  Administered 2023-03-17: 300 mg via ORAL
  Filled 2023-03-17: qty 1

## 2023-03-17 MED ORDER — METFORMIN HCL 500 MG PO TABS: 1000.0000 mg | ORAL_TABLET | Freq: Two times a day (BID) | ORAL | Status: DC

## 2023-03-17 MED ORDER — SODIUM CHLORIDE 0.9 % IV SOLN: INTRAVENOUS | Status: DC | PRN

## 2023-03-17 MED ORDER — 0.9 % SODIUM CHLORIDE (POUR BTL) OPTIME
TOPICAL | Status: DC | PRN
  Administered 2023-03-17: 1000 mL

## 2023-03-17 MED ORDER — HYDROMORPHONE HCL 1 MG/ML IJ SOLN
INTRAMUSCULAR | Status: AC
  Filled 2023-03-17: qty 1

## 2023-03-17 MED ORDER — OXYCODONE HCL 5 MG PO TABS
ORAL_TABLET | ORAL | Status: AC
  Filled 2023-03-17: qty 1

## 2023-03-17 MED ORDER — SUGAMMADEX SODIUM 200 MG/2ML IV SOLN
INTRAVENOUS | Status: DC | PRN
  Administered 2023-03-17: 200 mg via INTRAVENOUS

## 2023-03-17 MED ORDER — LIDOCAINE 2% (20 MG/ML) 5 ML SYRINGE
INTRAMUSCULAR | Status: AC
  Filled 2023-03-17: qty 5

## 2023-03-17 MED ORDER — CHLORHEXIDINE GLUCONATE CLOTH 2 % EX PADS: 6.0000 | MEDICATED_PAD | Freq: Once | CUTANEOUS | Status: DC

## 2023-03-17 MED ORDER — INSULIN ASPART 100 UNIT/ML IJ SOLN: 0.0000 [IU] | Freq: Three times a day (TID) | INTRAMUSCULAR | Status: DC

## 2023-03-17 MED ORDER — METOPROLOL SUCCINATE ER 50 MG PO TB24: 50.0000 mg | ORAL_TABLET | Freq: Every day | ORAL | Status: DC

## 2023-03-17 MED ORDER — ONDANSETRON 4 MG PO TBDP: 4.0000 mg | ORAL_TABLET | Freq: Four times a day (QID) | ORAL | Status: DC | PRN

## 2023-03-17 MED ORDER — LIDOCAINE 2% (20 MG/ML) 5 ML SYRINGE
INTRAMUSCULAR | Status: DC | PRN
  Administered 2023-03-17: 40 mg via INTRAVENOUS

## 2023-03-17 MED ORDER — FENTANYL CITRATE (PF) 100 MCG/2ML IJ SOLN: 25.0000 ug | INTRAMUSCULAR | Status: DC | PRN

## 2023-03-17 MED ORDER — DEXTROSE-SODIUM CHLORIDE 5-0.9 % IV SOLN
INTRAVENOUS | Status: DC
Start: 2023-03-17 — End: 2023-03-18

## 2023-03-17 MED ORDER — ROCURONIUM BROMIDE 10 MG/ML (PF) SYRINGE
PREFILLED_SYRINGE | INTRAVENOUS | Status: AC
  Filled 2023-03-17: qty 10

## 2023-03-17 MED ORDER — HYDROMORPHONE HCL 1 MG/ML IJ SOLN
0.2500 mg | INTRAMUSCULAR | Status: DC | PRN
Start: 2023-03-17 — End: 2023-03-17
  Administered 2023-03-17 (×2): 0.5 mg via INTRAVENOUS

## 2023-03-17 MED ORDER — FENTANYL CITRATE (PF) 100 MCG/2ML IJ SOLN
25.0000 ug | INTRAMUSCULAR | Status: DC | PRN
  Administered 2023-03-17 (×3): 50 ug via INTRAVENOUS

## 2023-03-17 MED ORDER — ONDANSETRON HCL 4 MG/2ML IJ SOLN
INTRAMUSCULAR | Status: DC | PRN
  Administered 2023-03-17: 4 mg via INTRAVENOUS

## 2023-03-17 MED ORDER — ONDANSETRON HCL 4 MG/2ML IJ SOLN: 4.0000 mg | Freq: Four times a day (QID) | INTRAMUSCULAR | Status: DC | PRN

## 2023-03-17 MED ORDER — CEFAZOLIN SODIUM-DEXTROSE 2-4 GM/100ML-% IV SOLN
2.0000 g | INTRAVENOUS | Status: AC
  Administered 2023-03-17: 2 g via INTRAVENOUS
  Filled 2023-03-17: qty 100

## 2023-03-17 MED ORDER — DEXMEDETOMIDINE HCL IN NACL 80 MCG/20ML IV SOLN
INTRAVENOUS | Status: DC | PRN
  Administered 2023-03-17: 8 ug via INTRAVENOUS

## 2023-03-17 MED ORDER — HEMOSTATIC AGENTS (NO CHARGE) OPTIME
TOPICAL | Status: DC | PRN
  Administered 2023-03-17: 1 via TOPICAL

## 2023-03-17 MED ORDER — LACTATED RINGERS IV SOLN: INTRAVENOUS | Status: DC

## 2023-03-17 MED ORDER — ORAL CARE MOUTH RINSE: 15.0000 mL | Freq: Once | OROMUCOSAL | Status: AC

## 2023-03-17 MED ORDER — CHLORHEXIDINE GLUCONATE 0.12 % MT SOLN
15.0000 mL | Freq: Once | OROMUCOSAL | Status: AC
Start: 2023-03-17 — End: 2023-03-17
  Administered 2023-03-17: 15 mL via OROMUCOSAL
  Filled 2023-03-17: qty 15

## 2023-03-17 MED ORDER — LACTATED RINGERS IV SOLN: INTRAVENOUS | Status: DC | PRN

## 2023-03-17 MED ORDER — ROCURONIUM BROMIDE 10 MG/ML (PF) SYRINGE
PREFILLED_SYRINGE | INTRAVENOUS | Status: DC | PRN
  Administered 2023-03-17: 20 mg via INTRAVENOUS
  Administered 2023-03-17: 50 mg via INTRAVENOUS

## 2023-03-17 MED ORDER — ACETAMINOPHEN 325 MG PO TABS: 650.0000 mg | ORAL_TABLET | Freq: Four times a day (QID) | ORAL | Status: DC | PRN

## 2023-03-17 MED ORDER — DEXAMETHASONE SODIUM PHOSPHATE 10 MG/ML IJ SOLN
INTRAMUSCULAR | Status: DC | PRN
  Administered 2023-03-17: 5 mg via INTRAVENOUS

## 2023-03-17 MED ORDER — PROPOFOL 1000 MG/100ML IV EMUL
INTRAVENOUS | Status: AC
  Filled 2023-03-17: qty 100

## 2023-03-17 MED ORDER — ONDANSETRON HCL 4 MG/2ML IJ SOLN
INTRAMUSCULAR | Status: AC
  Filled 2023-03-17: qty 2

## 2023-03-17 MED ORDER — INSULIN ASPART 100 UNIT/ML IJ SOLN
0.0000 [IU] | INTRAMUSCULAR | Status: DC | PRN
  Administered 2023-03-17: 2 [IU] via SUBCUTANEOUS
  Filled 2023-03-17: qty 1

## 2023-03-17 SURGICAL SUPPLY — 42 items
APPLICATOR CHLORAPREP 10.5 ORG (MISCELLANEOUS) ×1 IMPLANT
ATTRACTOMAT 16X20 MAGNETIC DRP (DRAPES) ×1 IMPLANT
BAG COUNTER SPONGE SURGICOUNT (BAG) ×1 IMPLANT
BLADE CLIPPER SURG (BLADE) IMPLANT
BLADE SURG 10 STRL SS (BLADE) ×1 IMPLANT
BLADE SURG 15 STRL LF DISP TIS (BLADE) ×1 IMPLANT
CANISTER SUCT 3000ML PPV (MISCELLANEOUS) ×1 IMPLANT
CLIP TI MEDIUM 24 (CLIP) ×1 IMPLANT
CLIP TI WIDE RED SMALL 24 (CLIP) ×1 IMPLANT
CNTNR URN SCR LID CUP LEK RST (MISCELLANEOUS) IMPLANT
COVER SURGICAL LIGHT HANDLE (MISCELLANEOUS) ×1 IMPLANT
DERMABOND ADVANCED .7 DNX12 (GAUZE/BANDAGES/DRESSINGS) ×1 IMPLANT
DRAPE LAPAROTOMY 100X72 PEDS (DRAPES) ×1 IMPLANT
DRAPE UTILITY XL STRL (DRAPES) ×1 IMPLANT
ELECT CAUTERY BLADE 6.4 (BLADE) ×1 IMPLANT
ELECT REM PT RETURN 9FT ADLT (ELECTROSURGICAL) ×1
ELECTRODE REM PT RTRN 9FT ADLT (ELECTROSURGICAL) ×1 IMPLANT
GAUZE 4X4 16PLY ~~LOC~~+RFID DBL (SPONGE) ×1 IMPLANT
GAUZE SPONGE 4X4 12PLY STRL (GAUZE/BANDAGES/DRESSINGS) IMPLANT
GLOVE SURG ORTHO 8.0 STRL STRW (GLOVE) ×1 IMPLANT
GOWN STRL REUS W/ TWL LRG LVL3 (GOWN DISPOSABLE) ×1 IMPLANT
GOWN STRL REUS W/ TWL XL LVL3 (GOWN DISPOSABLE) ×1 IMPLANT
HEMOSTAT ARISTA ABSORB 3G PWDR (HEMOSTASIS) IMPLANT
HEMOSTAT SURGICEL 2X4 FIBR (HEMOSTASIS) ×1 IMPLANT
ILLUMINATOR WAVEGUIDE N/F (MISCELLANEOUS) ×1 IMPLANT
KIT BASIN OR (CUSTOM PROCEDURE TRAY) ×1 IMPLANT
KIT TURNOVER KIT B (KITS) ×1 IMPLANT
NS IRRIG 1000ML POUR BTL (IV SOLUTION) ×1 IMPLANT
PACK SRG BSC III STRL LF ECLPS (CUSTOM PROCEDURE TRAY) ×1 IMPLANT
PAD ARMBOARD 7.5X6 YLW CONV (MISCELLANEOUS) ×1 IMPLANT
PENCIL BUTTON HOLSTER BLD 10FT (ELECTRODE) ×1 IMPLANT
POSITIONER HEAD DONUT 9IN (MISCELLANEOUS) ×1 IMPLANT
SHEARS HARMONIC 9CM CVD (BLADE) ×1 IMPLANT
SPONGE INTESTINAL PEANUT (DISPOSABLE) ×1 IMPLANT
STRIP CLOSURE SKIN 1/2X4 (GAUZE/BANDAGES/DRESSINGS) IMPLANT
SUT MNCRL AB 4-0 PS2 18 (SUTURE) ×1 IMPLANT
SUT SILK 2-0 18XBRD TIE 12 (SUTURE) ×1 IMPLANT
SUT VIC AB 3-0 SH 18 (SUTURE) ×1 IMPLANT
SYR BULB IRRIG 60ML STRL (SYRINGE) ×1 IMPLANT
TOWEL GREEN STERILE (TOWEL DISPOSABLE) ×1 IMPLANT
TOWEL GREEN STERILE FF (TOWEL DISPOSABLE) ×1 IMPLANT
TUBE CONNECTING 12X1/4 (SUCTIONS) ×1 IMPLANT

## 2023-03-17 NOTE — Interval H&P Note (Signed)
 History and Physical Interval Note:  03/17/2023 11:12 AM  Christina Larsen  has presented today for surgery, with the diagnosis of SUBSTERNAL THYROID  GOITER.  The various methods of treatment have been discussed with the patient and family. After consideration of risks, benefits and other options for treatment, the patient has consented to    Procedure(s): LEFT THYROID  LOBECTOMY (Left) as a surgical intervention.    The patient's history has been reviewed, patient examined, no change in status, stable for surgery.  I have reviewed the patient's chart and labs.  Questions were answered to the patient's satisfaction.    Krystal Spinner, MD [facility] practice Office: [phone]   Krystal Spinner

## 2023-03-17 NOTE — Transfer of Care (Signed)
 Immediate Anesthesia Transfer of Care Note  Patient: Christina Larsen  Procedure(s) Performed: LEFT THYROID  LOBECTOMY (Left)  Patient Location: PACU  Anesthesia Type:General  Level of Consciousness: awake, drowsy, patient cooperative, and responds to stimulation  Airway & Oxygen Therapy: Patient Spontanous Breathing and Patient connected to face mask oxygen  Post-op Assessment: Report given to RN and Post -op Vital signs reviewed and stable  Post vital signs: Reviewed and stable  Last Vitals:  Vitals Value Taken Time  BP 177/74 03/17/23 1321  Temp    Pulse 69 03/17/23 1323  Resp 12 03/17/23 1323  SpO2 95 % 03/17/23 1323  Vitals shown include unfiled device data.  Last Pain:  Vitals:   03/17/23 0917  PainSc: 0-No pain         Complications: No notable events documented.

## 2023-03-17 NOTE — Anesthesia Procedure Notes (Signed)
 Procedure Name: Intubation Date/Time: 03/17/2023 11:41 AM  Performed by: Loreda Rodriguez, CRNAPre-anesthesia Checklist: Patient identified, Emergency Drugs available, Suction available and Patient being monitored Patient Re-evaluated:Patient Re-evaluated prior to induction Oxygen Delivery Method: Circle System Utilized Preoxygenation: Pre-oxygenation with 100% oxygen Induction Type: IV induction Ventilation: Mask ventilation without difficulty Laryngoscope Size: Glidescope and 3 Grade View: Grade I Tube type: Oral Tube size: 6.5 mm Number of attempts: 1 Airway Equipment and Method: Stylet and Oral airway Placement Confirmation: ETT inserted through vocal cords under direct vision, positive ETCO2 and breath sounds checked- equal and bilateral Secured at: 22 cm Tube secured with: Tape Dental Injury: Teeth and Oropharynx as per pre-operative assessment

## 2023-03-17 NOTE — Op Note (Signed)
 Procedure Note  Pre-operative Diagnosis:  Left thyroid  nodule, tracheal deviation, substernal goiter  Post-operative Diagnosis:  same  Surgeon:  Krystal Spinner, MD  Assistant:  none   Procedure:  Left thyroid  lobectomy and isthmusectomy with resection of substernal goiter  Anesthesia:  General  Estimated Blood Loss:  25 cc  Drains: none         Specimen: thyroid  lobe to pathology  Indications:  Patient is referred by her primary care provider, Waddell Mon, NP, for surgical evaluation and recommendations regarding newly identified thyroid  nodules. Patient had undergone imaging related to a compression fracture of the thoracic spine. Incidental finding was made of a thyroid  nodule. Patient subsequently underwent an ultrasound on December 13, 2022. This demonstrated a normal right thyroid  lobe containing a small 9 mm nodule and a large inferior left thyroid  nodule measuring 6.5 x 3.0 x 4.3 cm and extending into the upper mediastinum. Fine-needle aspiration biopsy was performed and cytopathology demonstrated a benign follicular nodule, Bethesda category II. Patient has no history of thyroid  disease. She has never been on thyroid  medication. She has had no prior head or neck surgery. Patient has noted some issues with intermittent dysphagia. This is mainly with solid food. She has also noted some changes in her voice quality. Patient has also developed a mild globus sensation. Patient presents today to discuss management. Recent TSH level is normal at 0.76.  Patient also has a history of coronary artery disease and is followed by Dr. Debby Sor.  I reviewed her MRI scan. While the images are somewhat limited, there does appear to be a significant size substernal mass on the left which is causing tracheal deviation and perhaps some degree of tracheal compression.   Procedure Details: Procedure was done in OR #2 at the [facility]. The patient was brought to the operating room and placed in a  supine position on the operating room table. Following administration of general anesthesia, the patient was positioned and then prepped and draped in the usual aseptic fashion. After ascertaining that an adequate level of anesthesia had been achieved, a small Kocher incision was made with #15 blade. Dissection was carried through subcutaneous tissues and platysma. Hemostasis was achieved with the electrocautery. Skin flaps were elevated cephalad and caudad from the thyroid  notch to the sternal notch. A self-retaining retractor was placed for exposure. Strap muscles were incised in the midline and dissection was begun on the left side. Strap muscles were reflected laterally. The left thyroid  lobe was markedly enlarged, firm, and extended into upper mediastinum beneath the left clavicle.  The trachea was deviated to the right approximately 1.5 cm. The lobe was gently mobilized with blunt dissection. Superior pole vessels were dissected out and divided individually between small and medium ligaclips with the harmonic scalpel. The thyroid  lobe was rolled anteriorly. Branches of the inferior thyroid  artery were divided between small ligaclips with the harmonic scalpel. The inferior gland was gently mobilized out of the mediastinum with blunt dissection and delivered into the field.  Inferior venous tributaries were divided between ligaclips. Both the superior and inferior parathyroid glands were identified and preserved on their vascular pedicles. The recurrent laryngeal nerve was identified and preserved along its course. The ligament of Court was released with the electrocautery and the gland was mobilized onto the anterior trachea. Isthmus was mobilized across the midline. There was a small pyramidal lobe present which was resected with the isthmus. The thyroid  parenchyma was transected at the junction of the isthmus and contralateral  thyroid  lobe with the harmonic scalpel. A suture was used to mark the isthmus  margin. The thyroid  lobe and isthmus were submitted to pathology for review.  The entire field was palpated for evidence of lymphadenopathy or extra-thyroidal disease.  No worrisome findings were noted.  No enlarged lymph nodes were identified.  The neck was irrigated with warm saline. Fibrillar was placed throughout the operative field. Strap muscles were approximated in the midline with interrupted 3-0 Vicryl sutures. Platysma was closed with interrupted 3-0 Vicryl sutures. Skin was closed with a running 4-0 Monocryl subcuticular suture.  Wound was washed and dried and Dermabond was applied. The patient was awakened from anesthesia and brought to the recovery room. The patient tolerated the procedure well.   Krystal Spinner, MD [facility] Surgery Office: [phone]

## 2023-03-18 ENCOUNTER — Other Ambulatory Visit (HOSPITAL_BASED_OUTPATIENT_CLINIC_OR_DEPARTMENT_OTHER): Payer: Self-pay

## 2023-03-18 ENCOUNTER — Encounter (HOSPITAL_COMMUNITY): Payer: Self-pay | Admitting: Surgery

## 2023-03-18 DIAGNOSIS — E041 Nontoxic single thyroid nodule: Secondary | ICD-10-CM | POA: Diagnosis not present

## 2023-03-18 LAB — GLUCOSE, CAPILLARY: Glucose-Capillary: 257 mg/dL — ABNORMAL HIGH (ref 70–99)

## 2023-03-18 MED ORDER — OXYCODONE HCL 5 MG PO TABS
5.0000 mg | ORAL_TABLET | Freq: Four times a day (QID) | ORAL | 0 refills | Status: DC | PRN
  Filled 2023-03-18: qty 15, 4d supply, fill #0

## 2023-03-18 NOTE — Progress Notes (Signed)
 Patient discharged to home with friend providing ride. IV removed x2 and paperwork explained. All belongings with patient.

## 2023-03-18 NOTE — Discharge Summary (Signed)
 Physician Discharge Summary   Patient ID: Christina Larsen MRN: [id number] DOB/AGE: Sep 12, 1955 68 y.o.  Admit date: 03/17/2023  Discharge date: 03/18/2023  Discharge Diagnoses:  Principal Problem:   Multiple thyroid  nodules Active Problems:   Substernal thyroid  goiter   Tracheal deviation   Discharged Condition: good  Hospital Course: Patient was admitted for observation following thyroid  lobectomy.  Post op course was uncomplicated.  Pain was well controlled.  Tolerated diet.  Patient was prepared for discharge home on POD#1.  Consults: None  Treatments: surgery: left thyroid  lobectomy and isthmusectomy with resection of substernal goiter  Discharge Exam: Blood pressure 137/83, pulse 87, temperature 97.8 F (36.6 C), temperature source Oral, resp. rate 17, height 5' 5 (1.651 m), weight 70.3 kg, SpO2 98%. HEENT - clear Neck - wound dry and intact; mild STS; voice normal; mild ecchymosis  Disposition: Home  Discharge Instructions     Diet - low sodium heart healthy   Complete by: As directed    Increase activity slowly   Complete by: As directed    No dressing needed   Complete by: As directed       Allergies as of 03/18/2023       Reactions   Hydrocodone-acetaminophen     Pt gets sick- vomiting        Medication List     TAKE these medications    acetaminophen  500 MG tablet Commonly known as: TYLENOL  Take 500-1,000 mg by mouth every 6 (six) hours as needed (pain.).   alendronate  70 MG tablet Commonly known as: FOSAMAX  Take 1 tablet (70 mg total) by mouth once a week. Take with a full glass of water on an empty stomach. What changed: when to take this   aspirin  EC 81 MG tablet Take 81 mg by mouth in the morning. Swallow whole.   atorvastatin  80 MG tablet Commonly known as: LIPITOR  Take 1 tablet (80 mg total) by mouth daily. What changed: when to take this   cholecalciferol 25 MCG (1000 UNIT) tablet Commonly known as: VITAMIN D3 Take 1,000 Units by  mouth in the morning.   clopidogrel  75 MG tablet Commonly known as: PLAVIX  Take 1 tablet (75 mg total) by mouth daily.   gabapentin  300 MG capsule Commonly known as: NEURONTIN  Take 1 capsule (300 mg total) by mouth every 12 (twelve) hours as needed. What changed: when to take this   metFORMIN  1000 MG tablet Commonly known as: GLUCOPHAGE  Take 1 tablet (1,000 mg total) by mouth in the morning and at bedtime.   metoprolol  succinate 50 MG 24 hr tablet Commonly known as: Toprol  XL Take 1 tablet (50 mg total) by mouth daily.   multivitamin tablet Take 1 tablet by mouth in the morning.   nitroGLYCERIN  0.4 MG SL tablet Commonly known as: NITROSTAT  Place 1 tablet (0.4 mg total) under the tongue every 5 (five) minutes x 3 doses as needed. Call 911 after 3rd dose if no relief   oxyCODONE  5 MG immediate release tablet Commonly known as: Oxy IR/ROXICODONE  Take 1 tablet (5 mg total) by mouth every 6 (six) hours as needed for moderate pain (pain score 4-6).   Trulicity  4.5 MG/0.5ML Soaj Generic drug: Dulaglutide  Inject 4.5 mg as directed once a week. What changed: when to take this               Discharge Care Instructions  (From admission, onward)           Start     Ordered  03/18/23 0000  No dressing needed        03/18/23 9140            Follow-up Information     Eletha Boas, MD. Schedule an appointment as soon as possible for a visit in 3 week(s).   Specialty: General Surgery Why: For wound re-check Contact information: [address] [place] [address] [phone]                 Boas Eletha, MD [facility] Office: [phone]   Signed: Boas Eletha 03/18/2023, 8:59 AM

## 2023-03-18 NOTE — Progress Notes (Signed)
 Transition of Care [facility]) - Inpatient Brief Assessment   Patient Details  Name: Christina Larsen MRN: [id number] Date of Birth: 1956-02-15  [facility] of [facility] CM/SW Contact:    Rosaline JONELLE Joe, RN Phone Number: 03/18/2023, 10:49 AM   Clinical Narrative: CM met with the patient at the bedside and patient was provided with Medicare Observation notice prior to discharge to home.  No TOC at this time and patient's family is providing transportation to home via car.  Patient plans to follow up with Dr. Eletha for surgery follow up.   Transition of Care Asessment: Insurance and Status: (P) Insurance coverage has been reviewed Patient has primary care physician: (P) Yes Home environment has been reviewed: (P) from home Prior level of function:: (P) Independent Prior/Current Home Services: (P) No current home services Social Drivers of Health Review: (P) SDOH reviewed interventions complete Readmission risk has been reviewed: (P) Yes Transition of care needs: (P) no transition of care needs at this time

## 2023-03-18 NOTE — Anesthesia Postprocedure Evaluation (Signed)
 Anesthesia Post Note  Patient: Christina Larsen  Procedure(s) Performed: LEFT THYROID  LOBECTOMY (Left)     Patient location during evaluation: PACU Anesthesia Type: General Level of consciousness: awake and alert Pain management: pain level controlled Vital Signs Assessment: post-procedure vital signs reviewed and stable Respiratory status: spontaneous breathing, nonlabored ventilation, respiratory function stable and patient connected to nasal cannula oxygen Cardiovascular status: blood pressure returned to baseline and stable Postop Assessment: no apparent nausea or vomiting Anesthetic complications: no  No notable events documented.  Last Vitals:  Vitals:   03/18/23 0335 03/18/23 0808  BP: (!) 137/93 137/83  Pulse: 90 87  Resp: 18 17  Temp: 36.6 C 36.6 C  SpO2: 95% 98%    Last Pain:  Vitals:   03/18/23 0808  TempSrc: Oral  PainSc:                  Lashell Moffitt L Alexcis Bicking

## 2023-03-18 NOTE — Plan of Care (Signed)
  Problem: Education: Goal: Knowledge of General Education information will improve Description: Including pain rating scale, medication(s)/side effects and non-pharmacologic comfort measures Outcome: Progressing   Problem: Health Behavior/Discharge Planning: Goal: Ability to manage health-related needs will improve Outcome: Progressing   Problem: Clinical Measurements: Goal: Ability to maintain clinical measurements within normal limits will improve Outcome: Progressing Goal: Will remain free from infection Outcome: Progressing Goal: Diagnostic test results will improve Outcome: Progressing Goal: Respiratory complications will improve Outcome: Progressing Goal: Cardiovascular complication will be avoided Outcome: Progressing   Problem: Activity: Goal: Risk for activity intolerance will decrease Outcome: Progressing   Problem: Nutrition: Goal: Adequate nutrition will be maintained Outcome: Progressing   Problem: Coping: Goal: Level of anxiety will decrease Outcome: Progressing   Problem: Elimination: Goal: Will not experience complications related to bowel motility Outcome: Progressing Goal: Will not experience complications related to urinary retention Outcome: Progressing   Problem: Pain Management: Goal: General experience of comfort will improve Outcome: Progressing   Problem: Safety: Goal: Ability to remain free from injury will improve Outcome: Progressing   Problem: Skin Integrity: Goal: Risk for impaired skin integrity will decrease Outcome: Progressing   Problem: Education: Goal: Ability to describe self-care measures that may prevent or decrease complications (Diabetes Survival Skills Education) will improve Outcome: Progressing Goal: Individualized Educational Video(s) Outcome: Progressing   Problem: Coping: Goal: Ability to adjust to condition or change in health will improve Outcome: Progressing   Problem: Fluid Volume: Goal: Ability to  maintain a balanced intake and output will improve Outcome: Progressing   Problem: Health Behavior/Discharge Planning: Goal: Ability to identify and utilize available resources and services will improve Outcome: Progressing Goal: Ability to manage health-related needs will improve Outcome: Progressing   Problem: Metabolic: Goal: Ability to maintain appropriate glucose levels will improve Outcome: Progressing   Problem: Nutritional: Goal: Maintenance of adequate nutrition will improve Outcome: Progressing Goal: Progress toward achieving an optimal weight will improve Outcome: Progressing

## 2023-03-18 NOTE — Discharge Instructions (Signed)

## 2023-03-18 NOTE — Care Management Obs Status (Cosign Needed)
 MEDICARE OBSERVATION STATUS NOTIFICATION   Patient Details  Name: Christina Larsen MRN: [id number] Date of Birth: May 18, 1955   Medicare Observation Status Notification Given:  Yes    Janae Bridgeman, RN 03/18/2023, 10:46 AM

## 2023-03-19 LAB — SURGICAL PATHOLOGY

## 2023-03-19 NOTE — Progress Notes (Signed)
 Pathology is benign, as expected.  Darnell Level, MD [facility] practice Office: [phone]

## 2023-03-31 ENCOUNTER — Other Ambulatory Visit: Payer: Self-pay | Admitting: Family Medicine

## 2023-03-31 ENCOUNTER — Other Ambulatory Visit (HOSPITAL_BASED_OUTPATIENT_CLINIC_OR_DEPARTMENT_OTHER): Payer: Self-pay

## 2023-03-31 DIAGNOSIS — G629 Polyneuropathy, unspecified: Secondary | ICD-10-CM

## 2023-03-31 MED ORDER — GABAPENTIN 300 MG PO CAPS
300.0000 mg | ORAL_CAPSULE | Freq: Two times a day (BID) | ORAL | 3 refills | Status: DC | PRN
  Filled 2023-03-31: qty 30, 15d supply, fill #0
  Filled 2023-04-30: qty 30, 15d supply, fill #1
  Filled 2023-06-01: qty 30, 15d supply, fill #2

## 2023-04-02 DIAGNOSIS — H0102B Squamous blepharitis left eye, upper and lower eyelids: Secondary | ICD-10-CM | POA: Diagnosis not present

## 2023-04-02 DIAGNOSIS — H52223 Regular astigmatism, bilateral: Secondary | ICD-10-CM | POA: Diagnosis not present

## 2023-04-02 DIAGNOSIS — E119 Type 2 diabetes mellitus without complications: Secondary | ICD-10-CM | POA: Diagnosis not present

## 2023-04-02 DIAGNOSIS — H0102A Squamous blepharitis right eye, upper and lower eyelids: Secondary | ICD-10-CM | POA: Diagnosis not present

## 2023-04-02 DIAGNOSIS — H2513 Age-related nuclear cataract, bilateral: Secondary | ICD-10-CM | POA: Diagnosis not present

## 2023-04-02 DIAGNOSIS — H524 Presbyopia: Secondary | ICD-10-CM | POA: Diagnosis not present

## 2023-04-02 LAB — HM DIABETES EYE EXAM

## 2023-04-03 ENCOUNTER — Encounter: Payer: Self-pay | Admitting: Neurology

## 2023-04-09 ENCOUNTER — Encounter: Payer: Self-pay | Admitting: Family Medicine

## 2023-04-14 ENCOUNTER — Other Ambulatory Visit (HOSPITAL_BASED_OUTPATIENT_CLINIC_OR_DEPARTMENT_OTHER): Payer: Self-pay

## 2023-04-14 ENCOUNTER — Other Ambulatory Visit: Payer: Self-pay | Admitting: Family Medicine

## 2023-04-14 DIAGNOSIS — E119 Type 2 diabetes mellitus without complications: Secondary | ICD-10-CM

## 2023-04-14 MED ORDER — METFORMIN HCL 1000 MG PO TABS
1000.0000 mg | ORAL_TABLET | Freq: Two times a day (BID) | ORAL | 3 refills | Status: AC
  Filled 2023-04-14: qty 180, 90d supply, fill #0
  Filled 2023-07-07: qty 180, 90d supply, fill #1
  Filled 2023-10-24: qty 180, 90d supply, fill #2
  Filled 2024-01-21: qty 180, 90d supply, fill #3

## 2023-04-15 ENCOUNTER — Other Ambulatory Visit (HOSPITAL_BASED_OUTPATIENT_CLINIC_OR_DEPARTMENT_OTHER): Payer: Self-pay

## 2023-04-23 ENCOUNTER — Telehealth: Payer: Self-pay | Admitting: *Deleted

## 2023-04-23 DIAGNOSIS — Z9889 Other specified postprocedural states: Secondary | ICD-10-CM

## 2023-04-23 NOTE — Telephone Encounter (Signed)
Per Ladona Ridgel - TSH. Lab ordered. Thanks!

## 2023-04-23 NOTE — Telephone Encounter (Signed)
Pt has appt tomorrow and we are needed lab orders please.

## 2023-04-23 NOTE — Telephone Encounter (Signed)
Not clear what labs she needs, just A1C?

## 2023-04-24 ENCOUNTER — Encounter: Payer: Self-pay | Admitting: Family Medicine

## 2023-04-24 ENCOUNTER — Other Ambulatory Visit (INDEPENDENT_AMBULATORY_CARE_PROVIDER_SITE_OTHER): Payer: PPO

## 2023-04-24 DIAGNOSIS — Z9889 Other specified postprocedural states: Secondary | ICD-10-CM | POA: Diagnosis not present

## 2023-04-24 LAB — TSH: TSH: 2.54 u[IU]/mL (ref 0.35–5.50)

## 2023-04-24 NOTE — Progress Notes (Signed)
TSH is normal.  No need for supplemental thyroid hormone at this time.  Darnell Level, MD [facility] practice Office: [phone]

## 2023-04-24 NOTE — Addendum Note (Signed)
Addended by: Thelma Barge D on: 04/24/2023 10:04 AM   Modules accepted: Orders

## 2023-04-29 ENCOUNTER — Other Ambulatory Visit: Payer: Self-pay | Admitting: Family Medicine

## 2023-04-29 ENCOUNTER — Other Ambulatory Visit (HOSPITAL_BASED_OUTPATIENT_CLINIC_OR_DEPARTMENT_OTHER): Payer: Self-pay

## 2023-04-29 DIAGNOSIS — E785 Hyperlipidemia, unspecified: Secondary | ICD-10-CM

## 2023-04-29 MED ORDER — ATORVASTATIN CALCIUM 80 MG PO TABS
80.0000 mg | ORAL_TABLET | Freq: Every day | ORAL | 0 refills | Status: DC
  Filled 2023-04-29: qty 90, 90d supply, fill #0

## 2023-05-02 ENCOUNTER — Ambulatory Visit: Payer: HMO | Admitting: Family Medicine

## 2023-05-02 ENCOUNTER — Other Ambulatory Visit (HOSPITAL_BASED_OUTPATIENT_CLINIC_OR_DEPARTMENT_OTHER): Payer: Self-pay

## 2023-05-02 ENCOUNTER — Encounter: Payer: Self-pay | Admitting: Family Medicine

## 2023-05-02 VITALS — BP 132/78 | HR 72 | Ht 65.0 in | Wt 164.0 lb

## 2023-05-02 DIAGNOSIS — Z7984 Long term (current) use of oral hypoglycemic drugs: Secondary | ICD-10-CM

## 2023-05-02 DIAGNOSIS — E042 Nontoxic multinodular goiter: Secondary | ICD-10-CM

## 2023-05-02 DIAGNOSIS — E119 Type 2 diabetes mellitus without complications: Secondary | ICD-10-CM

## 2023-05-02 DIAGNOSIS — E785 Hyperlipidemia, unspecified: Secondary | ICD-10-CM

## 2023-05-02 MED ORDER — METFORMIN HCL 500 MG PO TABS
500.0000 mg | ORAL_TABLET | Freq: Every day | ORAL | 3 refills | Status: DC
  Filled 2023-05-02: qty 90, 90d supply, fill #0
  Filled 2023-07-07 – 2023-07-09 (×2): qty 90, 90d supply, fill #1
  Filled 2023-10-20: qty 90, 90d supply, fill #2
  Filled 2024-01-21: qty 90, 90d supply, fill #3

## 2023-05-02 NOTE — Assessment & Plan Note (Signed)
-  Medication management: continue lipitor 80 mg and 1400 mg fish oil daily -Repeat lipids in 2-3 months (just recently started fish oil) -Lifestyle factors for lowering cholesterol include: Diet therapy - heart-healthy diet rich in fruits, veggies, fiber-rich whole grains, lean meats, chicken, fish (at least twice a week), fat-free or 1% dairy products; foods low in saturated/trans fats, cholesterol, sodium, and sugar. Mediterranean diet has shown to be very heart healthy. Regular exercise - recommend at least 30 minutes a day, 5 times per week Weight management

## 2023-05-02 NOTE — Assessment & Plan Note (Signed)
Recent left thyroid lobectomy due to large nodule. Patient reports discomfort and mild difficulty swallowing post-surgery. Follow-up with surgeon scheduled for 05/21/2023. -Continue monitoring symptoms. -Consider checking thyroid levels if not done by surgeon. Most recently stable last week.

## 2023-05-02 NOTE — Progress Notes (Signed)
Established Patient Office Visit  Subjective   Patient ID: Christina Larsen, female    DOB: 04/15/1955  Age: 68 y.o. MRN: [id number]  Chief Complaint  Patient presents with   Medical Management of Chronic Issues    HPI  Patient is for routine follow-up. Reports she has been feeling well overall.  Discussed the use of AI scribe software for clinical note transcription with the patient, who gave verbal consent to proceed.  History of Present Illness   Christina Larsen "Christina Larsen" is a 68 year old female who presents for medication management and follow-up after recent kyphoplasty.  She is managing her diabetes with metformin and Trulicity. She takes 1500 mg of metformin in the morning and 1000 mg at night, requiring her to cut the 1000 mg tablets in half. She recently refilled her prescription but noted that the dosage was not updated to reflect her current regimen. Her blood sugar levels have been fluctuating, with recent readings around 137 to 141 mg/dL, following a post-surgical increase to 180 mg/dL. She is also on Trulicity 4.5 mg, along with other medications including cholesterol medicine, Plavix, metoprolol, and gabapentin.  She underwent kyphoplasty recently and describes the procedure as painful, noting that the sedation was ineffective during the operation. Postoperatively, she experiences discomfort at night when rolling in bed, which she attributes to the procedure.   She recently had left thyroid lobectomy with Dr. Gerrit Friends. The initial biopsy was determined to be non-cancerous but large, so surgical removal was recommended. Post-surgery, she experiences a sensation of her skin stretching when moving her head and occasional croakiness in her voice. She also reports difficulty swallowing at times, which she wonders might be related to her windpipe being pushed to the side during surgery. She is scheduled for a follow-up with Dr. Gerrit Friends in March to assess her thyroid function.  She is currently  taking alendronate for bone health but is concerned about side effects, including sensitive gums. She plans to look into Prolia to see if this is something she might be interested in trying instead.        HYPERLIPIDEMIA - medications: Lipitor 80 mg daily, 1400 mg fish oil - compliance: good - medication SEs: no - Patient saw cardiology on 04/15/22 and they started her on Vascepa 1 g BID with instructions to repeat labs in 3 months and if triglycerides are still higher than 150, increase Vascepa to 2 g BID. Cost was going to be $200 which she could not afford. She contacted their office and they recommended getting 1400 mg fish oil instead and have [place] continue to monitor. Stable at lats check 3 months ago and she is scheduled to see them again next month. Defer recheck today. The ASCVD Risk score (Arnett DK, et al., 2019) failed to calculate for the following reasons:   Risk score cannot be calculated because patient has a medical history suggesting prior/existing ASCVD   DIABETES: - Checking BG at home: rarely - Medications: Trulicity 4.5 mg weekly, metformin 1,500 mg AM and 1,000 mg PM - Compliance: good - Eye exam: pt to schedule - Microalbumin: UTD - Denies symptoms of hypoglycemia, polyuria, polydipsia, numbness extremities, foot ulcers/trauma, wounds that are not healing, medication side effects  Lab Results  Component Value Date   HGBA1C 8.3 (H) 02/25/2023    CAD/NSTEMI: following with cardiology every 6 months - Medications: metoprolol succinate 50 mg daily, Plavix 75mg  daily, Lipitor 80 mg daily - Compliance: good - Checking BP at home: not consistently  -  Denies any SOB, recurrent headaches, CP, vision changes, LE edema, dizziness, palpitations, or medication side effects. - Diet: working on a healthy diet, cut back on carbs, increased veggies - Exercise: going to the gym a few times per week   NEUROPATHY: -Stable on gabapentin         ROS All review of systems  negative except what is listed in the HPI    Objective:     BP 132/78   Pulse 72   Ht 5\' 5"  (1.651 m)   Wt 164 lb (74.4 kg)   SpO2 100%   BMI 27.29 kg/m    Physical Exam Vitals reviewed.  Constitutional:      Appearance: Normal appearance.  Cardiovascular:     Rate and Rhythm: Normal rate and regular rhythm.     Pulses: Normal pulses.     Heart sounds: Normal heart sounds.  Pulmonary:     Effort: Pulmonary effort is normal.     Breath sounds: Normal breath sounds.  Skin:    General: Skin is warm and dry.  Neurological:     Mental Status: She is alert and oriented to person, place, and time.  Psychiatric:        Mood and Affect: Mood normal.        Behavior: Behavior normal.        Thought Content: Thought content normal.        Judgment: Judgment normal.      No results found for any visits on 05/02/23.    The ASCVD Risk score (Arnett DK, et al., 2019) failed to calculate for the following reasons:   Risk score cannot be calculated because patient has a medical history suggesting prior/existing ASCVD    Assessment & Plan:   Problem List Items Addressed This Visit       Active Problems   Type 2 diabetes mellitus (HCC) (Chronic)   Patient is on Metformin 2500mg  daily (1500mg  in the morning, 1000mg  in the evening) and Trulicity 4.5mg . Recent blood glucose levels have been elevated (up to 180), but have started to decrease (137-141) since recovering from recent procedures.  -Continue current medication regimen. -Check A1C after 05/26/2023.      Relevant Medications   metFORMIN (GLUCOPHAGE) 500 MG tablet   Other Relevant Orders   Comprehensive metabolic panel   Lipid panel   Hemoglobin A1c   Hyperlipidemia - Primary (Chronic)   -Medication management: continue lipitor 80 mg and 1400 mg fish oil daily -Repeat lipids in 2-3 months (just recently started fish oil) -Lifestyle factors for lowering cholesterol include: Diet therapy - heart-healthy diet rich in  fruits, veggies, fiber-rich whole grains, lean meats, chicken, fish (at least twice a week), fat-free or 1% dairy products; foods low in saturated/trans fats, cholesterol, sodium, and sugar. Mediterranean diet has shown to be very heart healthy. Regular exercise - recommend at least 30 minutes a day, 5 times per week Weight management        Multiple thyroid nodules   Recent left thyroid lobectomy due to large nodule. Patient reports discomfort and mild difficulty swallowing post-surgery. Follow-up with surgeon scheduled for 05/21/2023. -Continue monitoring symptoms. -Consider checking thyroid levels if not done by surgeon. Most recently stable last week.         Return in about 6 months (around 10/30/2023) for routine follow-up / lab only appointment in 1 month .    Clayborne Dana, NP

## 2023-05-02 NOTE — Assessment & Plan Note (Signed)
Patient is on Metformin 2500mg  daily (1500mg  in the morning, 1000mg  in the evening) and Trulicity 4.5mg . Recent blood glucose levels have been elevated (up to 180), but have started to decrease (137-141) since recovering from recent procedures.  -Continue current medication regimen. -Check A1C after 05/26/2023.

## 2023-05-07 ENCOUNTER — Encounter: Payer: Self-pay | Admitting: Family Medicine

## 2023-05-12 ENCOUNTER — Telehealth: Payer: Self-pay

## 2023-05-12 ENCOUNTER — Encounter: Payer: Self-pay | Admitting: Family Medicine

## 2023-05-12 DIAGNOSIS — M810 Age-related osteoporosis without current pathological fracture: Secondary | ICD-10-CM

## 2023-05-12 DIAGNOSIS — M8580 Other specified disorders of bone density and structure, unspecified site: Secondary | ICD-10-CM

## 2023-05-12 DIAGNOSIS — M439 Deforming dorsopathy, unspecified: Secondary | ICD-10-CM

## 2023-05-12 MED ORDER — DENOSUMAB 60 MG/ML ~~LOC~~ SOSY
60.0000 mg | PREFILLED_SYRINGE | Freq: Once | SUBCUTANEOUS | Status: AC
Start: 2023-05-26 — End: 2023-06-11
  Administered 2023-06-11: 60 mg via SUBCUTANEOUS

## 2023-05-12 NOTE — Telephone Encounter (Signed)
 Per OV note on 05/02/23:    "She is currently taking alendronate for bone health but is concerned about side effects, including sensitive gums. She plans to look into Prolia to see if this is something she might be interested in trying instead. "   We will start referral. Initial Prolia CAM placed.  Please check PA/Cost.

## 2023-05-12 NOTE — Telephone Encounter (Signed)
 Prolia VOB initiated via [email or web address]

## 2023-05-12 NOTE — Telephone Encounter (Signed)
 Per OV note on 05/02/23:   "She is currently taking alendronate for bone health but is concerned about side effects, including sensitive gums. She plans to look into Prolia to see if this is something she might be interested in trying instead. "  We will start referral.

## 2023-05-19 ENCOUNTER — Ambulatory Visit: Payer: PPO | Attending: Cardiovascular Disease | Admitting: Cardiovascular Disease

## 2023-05-19 ENCOUNTER — Encounter: Payer: Self-pay | Admitting: Cardiovascular Disease

## 2023-05-19 DIAGNOSIS — I10 Essential (primary) hypertension: Secondary | ICD-10-CM

## 2023-05-19 DIAGNOSIS — E785 Hyperlipidemia, unspecified: Secondary | ICD-10-CM | POA: Diagnosis not present

## 2023-05-19 DIAGNOSIS — E782 Mixed hyperlipidemia: Secondary | ICD-10-CM

## 2023-05-19 DIAGNOSIS — I214 Non-ST elevation (NSTEMI) myocardial infarction: Secondary | ICD-10-CM

## 2023-05-19 DIAGNOSIS — G478 Other sleep disorders: Secondary | ICD-10-CM

## 2023-05-19 DIAGNOSIS — E118 Type 2 diabetes mellitus with unspecified complications: Secondary | ICD-10-CM | POA: Diagnosis not present

## 2023-05-19 DIAGNOSIS — E049 Nontoxic goiter, unspecified: Secondary | ICD-10-CM

## 2023-05-19 DIAGNOSIS — Z9889 Other specified postprocedural states: Secondary | ICD-10-CM

## 2023-05-19 NOTE — Patient Instructions (Signed)
 Medication Instructions:  No medication changes were made during today's visit.  *If you need a refill on your cardiac medications before your next appointment, please call your pharmacy*   Lab Work: No labs were ordered during today's visit.  If you have labs (blood work) drawn today and your tests are completely normal, you will receive your results only by: MyChart Message (if you have MyChart) OR A paper copy in the mail If you have any lab test that is abnormal or we need to change your treatment, we will call you to review the results.   Testing/Procedures: No procedures were ordered during today's visit.    Follow-Up: At [facility], you and your health needs are our priority.  As part of our continuing mission to provide you with exceptional heart care, we have created designated Provider Care Teams.  These Care Teams include your primary Cardiologist (physician) and Advanced Practice Providers (APPs -  Physician Assistants and Nurse Practitioners) who all work together to provide you with the care you need, when you need it.  We recommend signing up for the patient portal called "MyChart".  Sign up information is provided on this After Visit Summary.  MyChart is used to connect with patients for Virtual Visits (Telemedicine).  Patients are able to view lab/test results, encounter notes, upcoming appointments, etc.  Non-urgent messages can be sent to your provider as well.   To learn more about what you can do with MyChart, go to [email or web address].    Your next appointment:   6 month(s)  Provider:   Dr. Tonny Bollman     Other Instructions        1st Floor: - Lobby - Registration  - Pharmacy  - Lab - Cafe   2nd Floor: - PV Lab - Diagnostic Testing (echo, CT, nuclear med)   3rd Floor: - Vacant   4th Floor: - TCTS (cardiothoracic surgery) - AFib Clinic - Structural Heart Clinic - Vascular Surgery  - Vascular Ultrasound   5th Floor: -  HeartCare Cardiology (general and EP) - Clinical Pharmacy for coumadin, hypertension, lipid, weight-loss medications, and med management appointments      Valet parking services will be available as well.       Thank you for choosing [facility]!

## 2023-05-23 ENCOUNTER — Other Ambulatory Visit (HOSPITAL_COMMUNITY): Payer: Self-pay

## 2023-05-23 NOTE — Telephone Encounter (Signed)
 Pt ready for scheduling for PROLIA on or after : 05/23/23  Option# 1: Buy/Bill (Office supplied medication)  Out-of-pocket cost due at time of clinic visit: $332  Number of injection/visits approved: ---  Primary: [employer] Prolia co-insurance: 20% Admin fee co-insurance: 0%  Secondary: --- Prolia co-insurance:  Admin fee co-insurance:   Medical Benefit Details: Date Benefits were checked: 05/20/23 Deductible: NO/ Coinsurance: 20%/ Admin Fee: 0%  Prior Auth: N/A PA# Expiration Date:   # of doses approved: ----------------------------------------------------------------------- Option# 2- Med Obtained from pharmacy:  Pharmacy benefit: Copay [phone] (Paid to pharmacy) Admin Fee: 0% (Pay at clinic)  Prior Auth: N/A PA# Expiration Date:   # of doses approved:   If patient wants fill through the pharmacy benefit please send prescription to: [employer], and include estimated need by date in rx notes. Pharmacy will ship medication directly to the office.  Patient NOT eligible for Prolia Copay Card. Copay Card can make patient's cost as little as $25. Link to apply: [email or web address]  ** This summary of benefits is an estimation of the patient's out-of-pocket cost. Exact cost may very based on individual plan coverage.

## 2023-05-24 ENCOUNTER — Encounter: Payer: Self-pay | Admitting: Cardiovascular Disease

## 2023-05-24 NOTE — Progress Notes (Signed)
 Cardiology Office Note    Date:  05/24/2023   ID:  Christina Larsen, DOB 1955/11/16, MRN [id number]  PCP:  Clayborne Dana, NP  Cardiologist:  [name], MD   6 month F/U office visit  History of Present Illness:  Christina Larsen is a 68 y.o. female who is originally from [place].  She has a history of diabetes mellitus, hypertension, hyperlipidemia, and tobacco use.  She had undergone initial rotator cuff surgery in June 2021 and underwent a redo surgery at the end of October.  While watching [facility], she developed significant chest pain and presented to [facility] where ECG showed anteroseptal ST depression.  Troponin was elevated to 85 > 860.  She was given aspirin and started on heparin and nitroglycerin.  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  She required aspiration thrombectomy for significant clot burden and underwent successful stenting of her proximal circumflex vessel.  There was distal circumflex disease treated medically.  I had seen her during her hospitalization.  We had a lengthy discussion regarding absolute smoking cessation and since her presentation she has not had any further cigarettes.  She has been undergoing physical therapy for her redo shoulder surgery.  She was seen for initial post hospital follow-up on November 30 by Lynnae Sandhoff, NP.  I saw her for my initial office evaluation on March 14, 2020.  At that time she was feeling well and denied recurrent chest pain symptomatology and was unaware of palpitations.  She continued to be on aspirin and Brilinta for DAPT for minimum of 1 year, and is on metoprolol tartrate 25 mg twice a day.  She was tolerating  atorvastatin 80 mg daily and was on metformin and Trulicity for her diabetes mellitus.  She has peripheral neuropathy and is on gabapentin.    She presented to the emergency room on May 07, 2020 with complaints of intermittent chest tightness.  Her chest pain was intermittent and not exertionally precipitated.  Troponins were negative.  Chest x-ray did not reveal acute abnormalities.  The chest pain was a stabbing pain to the right shoulder and was not felt to be ischemic in etiology.  She was subsequently evaluated by Edd Fabian on May 09, 2020 and remained stable.  I saw her on Jul 24, 2020.  On May 12 while walking her dog she had an episode of vertigo.  She was unaware of palpitations.  She denied any recurrent chest pain symptoms or exertional dyspnea.  She admits to poor sleep.  She also has experienced fatigue and daytime sleepiness.  An Epworth Sleepiness Scale score was therefore calculated in the office today and this endorsed at 16 consistent with excessive daytime sleepiness. She completed cardiac rehab.  She is now participating in Silver sneaker program at the gym.  During her evaluation, I felt she was stable with reference to CAD.  I raise concern for possible sleep apnea which may be contributing to her significant excessive daytime sleepiness and with her poor sleep and cardiovascular comorbidities a sleep study was recommended.  She underwent a diagnostic polysomnogram in October 16, 2020.  Her AHI was 0 but she did have increased respiratory disturbance index of 12.5.  There  was no significant oxygen desaturation.  She had moderate snoring throughout the study.  He was felt most likely that she had increased upper airway resistance syndrome.  She was also noted to have periodic limb movement during sleep with associated arousals with an index of 10.5/h.  I saw on November 02, 2020.  At that time she was continuing to go to the  Silver sneakers program.  She denied any recurrent chest  pain.  She continues to be on DAPT with aspirin/Brilinta she is on atorvastatin 80 mg for aggressive lipid-lowering therapy.  She is diabetic on metformin and Trulicity.  She has neuropathy on gabapentin.  Remotely, she did try medication in 2006 for restless legs.    When I saw her on April 19, 2021 she remained stable and was without anginal symptomatology.  She denied any shortness of breath.  Fortunately she has stayed off tobacco since her presentation in November 2021.  At that time, she was wondering about longevity of continuing Brilinta due to cost and was subsequently transitioned to clopidogrel.  She continued to be on atorvastatin 80 mg for hyperlipidemia.  She was on metoprolol 25 mg twice a day.  At that time I suggested metoprolol to tartrate be discontinued and she was switched to metoprolol succinate 50 mg daily.  With her diabetes she was on metformin in addition to Trulicity.  She was seeing Dr. Jacky Kindle for primary care who was checking laboratory.  I saw her on November 02, 2021. Since her prior evaluation, she has switched her primary care provider to Hyman Hopes, NP at [facility] primary care.  Presently, she continues to be stable and denies any exertional chest pain.  At times she notes an occasional musculoskeletal twinge.  She goes to the gym at least 2-3 times per week where she rides a bike and walks on the treadmill.  She continues to work.  She was recently started on over-the-counter fish oil for elevated triglycerides and has been trying to improve her diet.    I saw her on April 15, 2022.  At that time she remained stable from a cardiovascular standpoint.   She had hurt her back and as result has not been exercising as much as she had in the past.  She had developed an episode of shingles on her head which ultimately resolved and she just recently received an initial shingles vaccination.  She continues to be followed by Hyman Hopes, NP for primary care.  Most recent laboratory  from January 24, 2022 showed an elevated glucose at 186 and hemoglobin A1c at 9.3.  She is on metformin 1000 g twice a day and her dose of Trulicity was doubled to current dose of 3 mg weekly.  Lipid studies revealed total cholesterol 135, triglycerides 191, HDL 52, VLDL 38, and LDL 45.  Renal function is normal with creatinine 0.74.  She continues to be on atorvastatin 80 mg daily for hyperlipidemia.  She has continued long-term DAPT with aspirin/Plavix.  She takes Neurontin for neuropathy.  She continues to be on metoprolol succinate 50 mg daily for blood pressure and CAD.    Since her prior evaluation Christina Larsen has moved.  She was carrying boxes for several days in a row and started to develop some back discomfort.  She subsequently has been diagnosed with a compression fracture at T8 and underwent an MRI today.  She is followed by Hyman Hopes, NP for primary care.  She did not tolerate tramadol.  Currently she is  taking Flexeril with some improvement.  She has been diagnosed with osteopenia in her left arm and also has degenerative spine changes at L2.  I last saw her on November 26, 2022 at which time she denied any chest pain or shortness of breath and was unaware of any arrhythmia.   She continues to be on clopidogrel 75 mg daily for antiplatelet therapy.  She is on atorvastatin 80 mg daily for hyperlipidemia.  She has continued to be on metoprolol succinate 50 mg daily.  She is on Trulicity 4.5 mg injection weekly with her diabetes mellitus in addition to her metformin.  She has neuropathy on gabapentin.  Recent lipid studies in May 2024 were improved with with over-the-counter natures bounty 1400 mg omega-3 fatty acid (980 mg of EPA/DHA) with triglycerides down to 143.  She did not want treatment with Vascepa due to cost.  LDL cholesterol was excellent at 30 with VLDL 28.6 and HDL 44.8.  Presently, she feels well.  She underwent kyphoplasty for compression fracture at T8 on December 25, 2022.  She  underwent surgery by Dr. Darnell Level with left thyroid lobectomy and isthmus ectomy with resection of a substernal goiter on March 17, 2023 which she tolerated well.  She continues to follow with Hyman Hopes, NP for primary care.  She denies any recurrent chest pain symptoms or significant shortness of breath.  She continues to be on aspirin and clopidogrel 75 mg daily, atorvastatin 80 mg, and omega-3 fatty acid daily for mixed hyperlipidemia, and metoprolol succinate 50 mg daily in addition to metformin 1500 mg in the morning and 1000 mg at night as well as Trulicity for diabetes mellitus.  She takes Fosamax weekly and as needed gabapentin for neuropathy.  She presents for evaluation.  Past Medical History:  Diagnosis Date   Allergy    Complication of anesthesia    Coronary artery disease    11/9 NSTEMI, PCI to pLCx with aspiration thrombectomy/DESx1, occluded mRCA with left to right collaterals   Diabetes mellitus without complication (HCC)    type 2   HLD (hyperlipidemia)    Hypertension    Myocardial infarction (HCC)    PONV (postoperative nausea and vomiting)     Past Surgical History:  Procedure Laterality Date   CARDIAC CATHETERIZATION     CHOLECYSTECTOMY  1992   COLONOSCOPY  2018   CORONARY STENT INTERVENTION N/A 01/17/2020   Procedure: CORONARY STENT INTERVENTION;  Surgeon: Yvonne Kendall, MD;  Location: [facility] INVASIVE CV LAB;  Service: Cardiovascular;  Laterality: N/A;   IR KYPHO THORACIC WITH BONE BIOPSY  01/01/2023   LAMINECTOMY  2006   LEFT HEART CATH AND CORONARY ANGIOGRAPHY N/A 01/17/2020   Procedure: LEFT HEART CATH AND CORONARY ANGIOGRAPHY;  Surgeon: Yvonne Kendall, MD;  Location: [facility] INVASIVE CV LAB;  Service: Cardiovascular;  Laterality: N/A;   rotator cuff surgery     THYROID LOBECTOMY Left 03/17/2023   Procedure: LEFT THYROID LOBECTOMY;  Surgeon: Darnell Level, MD;  Location: [facility] OR;  Service: General;  Laterality: Left;    Current Medications: Outpatient  Medications Prior to Visit  Medication Sig Dispense Refill   acetaminophen (TYLENOL) 500 MG tablet Take 500-1,000 mg by mouth every 6 (six) hours as needed (pain.).     alendronate (FOSAMAX) 70 MG tablet Take 1 tablet (70 mg total) by mouth once a week. Take with a full glass of water on an empty stomach. (Patient taking differently: Take 70 mg by mouth every Friday. Take with a full glass of  water on an empty stomach.) 12 tablet 3   aspirin EC 81 MG tablet Take 81 mg by mouth in the morning. Swallow whole.     atorvastatin (LIPITOR) 80 MG tablet Take 1 tablet (80 mg total) by mouth daily. 90 tablet 0   cholecalciferol (VITAMIN D3) 25 MCG (1000 UNIT) tablet Take 1,000 Units by mouth in the morning.     clopidogrel (PLAVIX) 75 MG tablet Take 1 tablet (75 mg total) by mouth daily. 90 tablet 3   Dulaglutide (TRULICITY) 4.5 MG/0.5ML SOPN Inject 4.5 mg as directed once a week. (Patient taking differently: Inject 4.5 mg as directed every Sunday.) 6 mL PRN   gabapentin (NEURONTIN) 300 MG capsule Take 1 capsule (300 mg total) by mouth every 12 (twelve) hours as needed. 30 capsule 3   metFORMIN (GLUCOPHAGE) 1000 MG tablet Take 1 tablet (1,000 mg total) by mouth in the morning and at bedtime. 180 tablet 3   metFORMIN (GLUCOPHAGE) 500 MG tablet Take 1 tablet (500 mg total) by mouth daily with breakfast. Take with the 1,000 mg tabs to total 1,500 mg in the morning and 1,000 mg in the evening. 90 tablet 3   metoprolol succinate (TOPROL XL) 50 MG 24 hr tablet Take 1 tablet (50 mg total) by mouth daily. 90 tablet 3   Multiple Vitamin (MULTIVITAMIN) tablet Take 1 tablet by mouth in the morning.     nitroGLYCERIN (NITROSTAT) 0.4 MG SL tablet Place 1 tablet (0.4 mg total) under the tongue every 5 (five) minutes x 3 doses as needed. Call 911 after 3rd dose if no relief (Patient not taking: Reported on 05/02/2023) 25 tablet 2   Facility-Administered Medications Prior to Visit  Medication Dose Route Frequency Provider  Last Rate Last Admin   [START ON 05/26/2023] denosumab (PROLIA) injection 60 mg  60 mg Subcutaneous Once Clayborne Dana, NP         Allergies:   Hydrocodone-acetaminophen   Social History   Socioeconomic History   Marital status: Widowed    Spouse name: Not on file   Number of children: 1   Years of education: 15   Highest education level: Associate degree: academic program  Occupational History   Occupation: [employer]  Tobacco Use   Smoking status: Former    Current packs/day: 0.00    Types: Cigarettes    Quit date: 01/16/2020    Years since quitting: 3.3   Smokeless tobacco: Never  Vaping Use   Vaping status: Never Used  Substance and Sexual Activity   Alcohol use: Yes    Alcohol/week: 6.0 standard drinks of alcohol    Types: 5 Glasses of wine, 1 Shots of liquor per week   Drug use: Never   Sexual activity: Not Currently    Birth control/protection: Post-menopausal  Other Topics Concern   Not on file  Social History Narrative   Semi Retired   Chief Executive Officer Drivers of Corporate investment banker Strain: Low Risk  (10/22/2022)   Overall Financial Resource Strain (CARDIA)    Difficulty of Paying Living Expenses: Not very hard  Food Insecurity: No Food Insecurity (03/17/2023)   Hunger Vital Sign    Worried About Running Out of Food in the Last Year: Never true    Ran Out of Food in the Last Year: Never true  Transportation Needs: No Transportation Needs (03/17/2023)   PRAPARE - Administrator, Civil Service (Medical): No    Lack of Transportation (Non-Medical): No  Physical Activity: Insufficiently  Active (10/22/2022)   Exercise Vital Sign    Days of Exercise per Week: 2 days    Minutes of Exercise per Session: 20 min  Stress: Stress Concern Present (10/22/2022)   [employer] of Occupational Health - Occupational Stress Questionnaire    Feeling of Stress : To some extent  Social Connections: Unknown (03/17/2023)   Social Connection and Isolation Panel  [NHANES]    Frequency of Communication with Friends and Family: Three times a week    Frequency of Social Gatherings with Friends and Family: Twice a week    Attends Religious Services: Patient declined    Database administrator or Organizations: No    Attends Banker Meetings: Never    Marital Status: Widowed    Socially she lives by herself.  She quit smoking the day of her heart attack.  She had been divorced from her husband who subsequently has died.  She has children.  Family History:  The patient's  family history includes Arthritis in her mother; Cancer in her brother; Colon cancer in her mother; Diabetes in her brother and mother; Heart disease in her father; Hypercholesterolemia in her father; Hyperlipidemia in her brother; Hypertension in her brother; Prostate cancer in her father.   ROS General: Negative; No fevers, chills, or night sweats;  HEENT: Negative; No changes in vision or hearing, sinus congestion, difficulty swallowing Pulmonary: Negative; No cough, wheezing, shortness of breath, hemoptysis Cardiovascular: See HPI GI: Negative; No nausea, vomiting, diarrhea, or abdominal pain GU: Negative; No dysuria, hematuria, or difficulty voiding Musculoskeletal: Negative; no myalgias, joint pain, or weakness Hematologic/Oncology: Negative; no easy bruising, bleeding Endocrine: Negative; no heat/cold intolerance; no diabetes Neuro: Negative; no changes in balance, headaches Skin: Negative; No rashes or skin lesions Psychiatric: Negative; No behavioral problems, depression Sleep: Poor sleep, fatigue, daytime sleepiness, snoring; no bruxism, restless legs, hypnogognic hallucinations, no cataplexy Other comprehensive 14 point system review is negative.   PHYSICAL EXAM:   VS:  BP 130/86   Pulse 84   Ht 5\' 6"  (1.676 m)   Wt 162 lb 3.2 oz (73.6 kg)   SpO2 94%   BMI 26.18 kg/m     Repeat blood pressure by me was 130/80  Wt Readings from Last 3 Encounters:   05/19/23 162 lb 3.2 oz (73.6 kg)  05/02/23 164 lb (74.4 kg)  03/17/23 155 lb (70.3 kg)   General: Alert, oriented, no distress.  Skin: normal turgor, no rashes, warm and dry HEENT: Normocephalic, atraumatic. Pupils equal round and reactive to light; sclera anicteric; extraocular muscles intact; Nose without nasal septal hypertrophy Mouth/Parynx benign; Mallinpatti scale 3 Neck: No JVD, no carotid bruits; normal carotid upstroke Lungs: clear to ausculatation and percussion; no wheezing or rales Chest wall: without tenderness to palpitation Heart: PMI not displaced, RRR, s1 s2 normal, 1/6 systolic murmur, no diastolic murmur, no rubs, gallops, thrills, or heaves Abdomen: soft, nontender; no hepatosplenomehaly, BS+; abdominal aorta nontender and not dilated by palpation. Back: no CVA tenderness Pulses 2+ Musculoskeletal: full range of motion, normal strength, no joint deformities Extremities: no clubbing cyanosis or edema, Homan's sign negative  Neurologic: grossly nonfocal; Cranial nerves grossly wnl Psychologic: Normal mood and affect    Studies/Labs Reviewed:   EKG Interpretation Date/Time:  Monday May 19 2023 10:03:36 EDT Ventricular Rate:  84 PR Interval:  158 QRS Duration:  102 QT Interval:  390 QTC Calculation: 460 R Axis:   -43  Text Interpretation: Normal sinus rhythm Left axis deviation Possible Lateral infarct ,  age undetermined When compared with ECG of 26-Nov-2022 08:25, Nonspecific T wave abnormality, worse in Lateral leads Confirmed by [name] ([id number]) on 05/24/2023 1:30:54 PM    November 26, 2022 ECG (independently read by me): Normal sinus rhythm at 75, incomplete right bundle branch block  April 15, 2022 ECG (independently read by me): Low atrial rhythm at 75, no ST changes, normal intervals   November 02, 2021 ECG (independently read by me): NSR at 73, LAHB  April 19, 2021 ECG (independently read by me):  NSR at 78, IRBBB, no ectopy  November 02, 2020 ECG (independently read by me): Normal sinus rhythm at 72 bpm, left axis deviation.  No ectopy.  No ST segment changes.  Jul 24, 2020 ECG (independently read by me): NSR at 72; Left axis deviation  January 2022 ECG (independently read by me): Normal sinus rhythm at 68 bpm.  Left axis deviation.  Nondiagnostic T wave abnormality.  Recent Labs:    Latest Ref Rng & Units 02/25/2023    7:30 AM 12/16/2022   10:25 AM 07/31/2022    7:23 AM  BMP  Glucose 70 - 99 mg/dL 562  130  865   BUN 6 - 23 mg/dL 10  15  15    Creatinine 0.40 - 1.20 mg/dL 7.84  6.96  2.95   BUN/Creat Ratio 6 - 22 (calc)  SEE NOTE:    Sodium 135 - 145 mEq/L 141  138  140   Potassium 3.5 - 5.1 mEq/L 4.3  4.2  4.4   Chloride 96 - 112 mEq/L 104  105  103   CO2 19 - 32 mEq/L 29  24  28    Calcium 8.4 - 10.5 mg/dL 8.9  9.7  9.8         Latest Ref Rng & Units 02/25/2023    7:30 AM 12/16/2022   10:25 AM 07/31/2022    7:23 AM  Hepatic Function  Total Protein 6.0 - 8.3 g/dL 6.7  7.0  6.9   Albumin 3.5 - 5.2 g/dL 4.1   4.3   AST 0 - 37 U/L 16  18  17    ALT 0 - 35 U/L 18  21  20    Alk Phosphatase 39 - 117 U/L 71   64   Total Bilirubin 0.2 - 1.2 mg/dL 0.7  0.7  0.8        Latest Ref Rng & Units 02/25/2023    7:30 AM 12/16/2022   10:25 AM 10/08/2021    7:11 AM  CBC  WBC 4.0 - 10.5 K/uL 7.0  7.8  7.2   Hemoglobin 12.0 - 15.0 g/dL 28.4  13.2  44.0   Hematocrit 36.0 - 46.0 % 43.6  45.0  42.6   Platelets 150.0 - 400.0 K/uL 216.0  223  192.0    Lab Results  Component Value Date   MCV 92.5 02/25/2023   MCV 92.0 12/16/2022   MCV 92.6 10/08/2021   Lab Results  Component Value Date   TSH 2.54 04/24/2023   Lab Results  Component Value Date   HGBA1C 8.3 (H) 02/25/2023     BNP No results found for: "BNP"  ProBNP No results found for: "PROBNP"   Lipid Panel     Component Value Date/Time   CHOL 103 07/31/2022 0723   CHOL 114 04/20/2021 0811   TRIG 143.0 07/31/2022 0723   HDL 44.80 07/31/2022 0723   HDL 47  04/20/2021 0811   CHOLHDL 2 07/31/2022 0723   VLDL  28.6 07/31/2022 0723   LDLCALC 30 07/31/2022 0723   LDLCALC 38 04/20/2021 0811   LABVLDL 29 04/20/2021 0811     RADIOLOGY: No results found.   Additional studies/ records that were reviewed today include:   01/17/2020 Conclusions: Significant two-vessel coronary artery disease with occlusions of the proximal LCx (acute with heavy thrombus burden) and mid RCA (chronic with left-to-right collaterals).  Moderate, non-obstructive coronary artery disease is also present in the ramus intermedius and distal LCx (codominant vessel). Mid LAD myocardial bridge. Mildly elevated left ventricular filling pressure with lateral wall hypokinesis and mildly reduced contraction. Successful PCI to proximal LCx with aspiration thrombectomy and placement of Resolute Onyx 3.0 x 18 mm drug-eluting stent (postdilated to 3.3 mm) with 0% residual stenosis and TIMI-3 flow.   Recommendations: Dual antiplatelet therapy with aspirin and ticagrelor for at least 12 months. Aggressive secondary prevention, including high-intensity statin therapy and smoking cessation. Medical management of chronic total occlusion of mid RCA as well as moderate ramus/distal LCx disease. Remove right femoral artery sheath with manual compression two hours after discontinuation of bivalirudin  Intervention      ECHO IMPRESSIONS   1. Left ventricular ejection fraction, by estimation, is 55%. The left  ventricle has normal function. The left ventricle has no regional wall  motion abnormalities. Left ventricular diastolic parameters were normal.   2. Right ventricular systolic function is normal. The right ventricular  size is normal.   3. The mitral valve is normal in structure. No evidence of mitral valve  regurgitation. No evidence of mitral stenosis.   4. The aortic valve is normal in structure. Aortic valve regurgitation is  not visualized. Mild aortic valve sclerosis is  present, with no evidence  of aortic valve stenosis.   5. Aortic dilatation noted. There is mild dilatation of the ascending  aorta, measuring 40 mm.   6. The inferior vena cava is normal in size with greater than 50%  respiratory variability, suggesting right atrial pressure of 3 mmHg.    ASSESSMENT:    1. Non-ST elevation (NSTEMI) myocardial infarction [facility]): November 2021   2. Mixed hyperlipidemia   3. Essential hypertension   4. Hyperlipidemia with target LDL less than 55   5. Type 2 diabetes mellitus with complication, without long-term current use of insulin (HCC)   6. UARS (upper airway resistance syndrome)   7. Status post kyphoplasty: T8, December 25, 2022   8. Substernal thyroid goiter resection: 03/17/2023     PLAN:  Christina Larsen is a very pleasant 68 year old female who is originally from [place].  She has a history of previous significant tobacco use, hypertension, diabetes mellitus, as well as hyperlipidemia.  She developed an acute coronary syndrome 1 week following her redo right shoulder surgery and was found to have thrombotic occlusion of her proximal circumflex in November 2021.  Catheterization also revealed a chronic total occlusion of RCA which was well collateralized via septal perforators arising from the LAD.  She also had mild concomitant CAD in her LAD, moderate in the ramus intermediate, and in her proximal RCA.  She has not had any recurrent anginal symptomatology and underwent successful DES stenting after thrombectomy of her proximal circumflex.  Since her initial post hospital office visit with me, she  presented to the emergency room in February 2022 with atypical chest pain that was described as a stabbing sharp discomfort radiating to her right shoulder.  This most likely was musculoskeletal in etiology.  She has not had recurrent symptoms  and has continued to be on DAPT following her STEMI and is tolerating this well.  She was treated with aspirin/Brilinta  which she tolerated well but at her office visit in February 2023 due to cost issues she was transitioned to generic clopidogrel.  She continues to be stable and is without recurrent anginal symptomatology on her current regimen of metoprolol succinate 50 mg daily.  ECG and heart rate are stable.  She has a history of mixed hyperlipidemia but due to cost this is was not started on Vascepa but instead with natures bounty 1400 mg omega-3 fatty acid (980 mg consisting of EPA/DHA).  She continues to be followed by Hyman Hopes, NP for primary care.  I have recommended she undergo follow-up lipid panel.  She has undergone kyphoplasty on December 25, 2022 for compression fracture at T8 which she tolerated well.  On March 17, 2023 she underwent left thyroid lobectomy and isthmus ectomy with resection of a substernal goiter by Dr. Darnell Level and tolerated surgery well.  She is diabetic on metformin and Trulicity.  She takes gabapentin as needed for neuropathy.  I discussed with her my plans for upcoming retirement.  She is aware of Dr. Excell Seltzer, with her interventional history, I will transition her to the care of Dr. Tonny Bollman for follow-up Cardiologic evaluation in approximately 6 months or sooner as needed.    Medication Adjustments/Labs and Tests Ordered: Current medicines are reviewed at length with the patient today.  Concerns regarding medicines are outlined above.  Medication changes, Labs and Tests ordered today are listed in the Patient Instructions below. Patient Instructions  Medication Instructions:  No medication changes were made during today's visit.  *If you need a refill on your cardiac medications before your next appointment, please call your pharmacy*   Lab Work: No labs were ordered during today's visit.  If you have labs (blood work) drawn today and your tests are completely normal, you will receive your results only by: MyChart Message (if you have MyChart) OR A paper copy in the  mail If you have any lab test that is abnormal or we need to change your treatment, we will call you to review the results.   Testing/Procedures: No procedures were ordered during today's visit.    Follow-Up: At [facility], you and your health needs are our priority.  As part of our continuing mission to provide you with exceptional heart care, we have created designated Provider Care Teams.  These Care Teams include your primary Cardiologist (physician) and Advanced Practice Providers (APPs -  Physician Assistants and Nurse Practitioners) who all work together to provide you with the care you need, when you need it.  We recommend signing up for the patient portal called "MyChart".  Sign up information is provided on this After Visit Summary.  MyChart is used to connect with patients for Virtual Visits (Telemedicine).  Patients are able to view lab/test results, encounter notes, upcoming appointments, etc.  Non-urgent messages can be sent to your provider as well.   To learn more about what you can do with MyChart, go to [email or web address].    Your next appointment:   6 month(s)  Provider:   Dr. Tonny Bollman     Other Instructions        1st Floor: - Lobby - Registration  - Pharmacy  - Lab - Cafe   2nd Floor: - PV Lab - Diagnostic Testing (echo, CT, nuclear med)   3rd Floor: - Vacant  4th Floor: - TCTS (cardiothoracic surgery) - AFib Clinic - Structural Heart Clinic - Vascular Surgery  - Vascular Ultrasound   5th Floor: - HeartCare Cardiology (general and EP) - Clinical Pharmacy for coumadin, hypertension, lipid, weight-loss medications, and med management appointments      Valet parking services will be available as well.       Thank you for choosing [facility]!       Signed, [name], MD  05/24/2023 1:48 PM    [facility] [address], [place], [place]  [address] Phone: [phone]

## 2023-05-28 ENCOUNTER — Other Ambulatory Visit: Payer: HMO

## 2023-05-28 NOTE — Addendum Note (Signed)
 Addended by: Hyman Hopes B on: 05/28/2023 12:22 PM   Modules accepted: Orders

## 2023-06-02 ENCOUNTER — Encounter: Payer: Self-pay | Admitting: Physician Assistant

## 2023-06-02 ENCOUNTER — Other Ambulatory Visit (HOSPITAL_BASED_OUTPATIENT_CLINIC_OR_DEPARTMENT_OTHER): Payer: Self-pay

## 2023-06-02 ENCOUNTER — Ambulatory Visit: Admitting: Physician Assistant

## 2023-06-02 ENCOUNTER — Telehealth: Payer: Self-pay | Admitting: Family Medicine

## 2023-06-02 VITALS — Ht 65.0 in | Wt 160.0 lb

## 2023-06-02 DIAGNOSIS — M810 Age-related osteoporosis without current pathological fracture: Secondary | ICD-10-CM | POA: Insufficient documentation

## 2023-06-02 NOTE — Telephone Encounter (Signed)
 Christina Larsen - you referred to Osteoporosis clinic. Please advise.

## 2023-06-02 NOTE — Telephone Encounter (Signed)
 Pt came in office stating is wanting to get Prolia shot, pt would like to have order to be put in for the prolia shot so pt can get the shot in our office.  Please contact pt [phone] so that pt can get schedule for prolia shot once it is in office.

## 2023-06-02 NOTE — Progress Notes (Signed)
 Office Visit Note   Patient: Christina Larsen           Date of Birth: 03-Jul-1955           MRN: [id number] Visit Date: 06/02/2023              Requested by: Clayborne Dana, NP [address] [place],  [place] [address] PCP: Clayborne Dana, NP   Assessment & Plan: Visit Diagnoses:  1. Age-related osteoporosis without current pathological fracture     Plan: Pleasant 68 year old woman comes in today for opinion on osteoporosis treatment.  She recently had a bone density test which was -2.2.  However she did have a fragility fracture last summer at T8 with subsequent kyphoplasty.  She sustained a fracture simply by moving some boxes.  She does have a history of heart disease.  She has had stents.  This was in 2021.  Currently she is taking Fosamax and has had some jaw pain.  She is taking vitamin D and calcium.  She did have thyroid issues but is doing fine now after surgery and has a normal TSH.  She walks but does not do any strength training.  We talked about factor she can change including she is having a diet that is adequate and calcium and vitamin D.  Certainly employing strength training safely through a Silver sneakers program would be helpful as well.  She has been approved for Prolia through her primary care provider.  I think this would be better than Fosamax for her.  I would not recommend Evenity given her cardiac history.  I do not think Tymlos would be covered by her insurance.  Would recommend continue monitoring of her calcium levels vitamin D and get the Prolia with her primary care since is already been approved.  Could have a repeat bone density in 1 year as part of monitoring the medication.  May follow-up with me as needed.  Spent 40 minutes discussing side effects medication options and coverage Follow-Up Instructions: As needed   Orders:  No orders of the defined types were placed in this encounter.  No orders of the defined types were placed in this  encounter.     Procedures: No procedures performed   Clinical Data: No additional findings.   Subjective: Chief Complaint  Patient presents with   Neck - Pain    Patient in today for osteoporosis management, she has a compression fracture and pain in the neck and mid back area  had surgery on lower lumbar in 2006     HPI pleasant 68 year old woman who comes in today in seeking information about osteoporosis medications.  She has a history of T8 fracture last summer treated with kyphoplasty.  She currently is on Fosamax but is having some side effects.  She does have a history of heart disease.  She exercises by walking.  She recently had thyroid surgery but still has a functioning lobe of her thyroid and her recent TSH numbers were normal.  She has been preauthorized by her primary care for Prolia and is wondering if this would be a better medication  Review of Systems  All other systems reviewed and are negative.    Objective: Vital Signs: Ht 5\' 5"  (1.651 m)   Wt 160 lb (72.6 kg)   BMI 26.63 kg/m   Physical Exam Constitutional:      Appearance: Normal appearance.  Pulmonary:     Effort: Pulmonary effort is normal.  Skin:    General: Skin is warm and dry.  Neurological:     General: No focal deficit present.     Mental Status: She is alert and oriented to person, place, and time.  Psychiatric:        Mood and Affect: Mood normal.        Behavior: Behavior normal.       Specialty Comments:  No specialty comments available.  Imaging: No results found.   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   Coronary artery disease 09/27/2021   NSTEMI (non-ST elevated myocardial infarction) (HCC) 01/17/2020   Type 2 diabetes mellitus (HCC) 01/17/2020   Hyperlipidemia 01/17/2020   Partial thickness rotator cuff tear 08/13/2019   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   Past Medical History:  Diagnosis Date   Allergy    Complication of anesthesia    Coronary artery disease    11/9 NSTEMI, PCI to pLCx with aspiration thrombectomy/DESx1, occluded mRCA with left to right collaterals   Diabetes mellitus without complication (HCC)    type 2   HLD (hyperlipidemia)    Hypertension    Myocardial infarction (HCC)    PONV (postoperative nausea and vomiting)     Family History  Problem Relation Age of Onset   Arthritis Mother    Diabetes Mother    Colon cancer Mother    Heart disease Father    Prostate cancer Father    Hypercholesterolemia Father    Cancer Brother    Diabetes Brother    Hyperlipidemia Brother    Hypertension Brother     Past Surgical History:  Procedure Laterality Date   CARDIAC CATHETERIZATION     CHOLECYSTECTOMY  1992   COLONOSCOPY  2018   CORONARY STENT INTERVENTION N/A 01/17/2020   Procedure: CORONARY STENT INTERVENTION;  Surgeon: Yvonne Kendall, MD;  Location: [facility] INVASIVE [facility];  Service: Cardiovascular;  Laterality: N/A;   IR KYPHO THORACIC WITH BONE BIOPSY  01/01/2023   LAMINECTOMY  2006   LEFT HEART CATH AND CORONARY ANGIOGRAPHY N/A 01/17/2020   Procedure: LEFT HEART CATH AND CORONARY ANGIOGRAPHY;  Surgeon: Yvonne Kendall, MD;  Location: [facility] INVASIVE CV LAB;  Service: Cardiovascular;  Laterality: N/A;   rotator cuff surgery     THYROID LOBECTOMY Left 03/17/2023   Procedure: LEFT THYROID LOBECTOMY;  Surgeon: Darnell Level, MD;  Location: [facility] OR;  Service: General;  Laterality: Left;   Social History   Occupational History   Occupation: [employer]  Tobacco Use   Smoking status: Former     Current packs/day: 0.00    Types: Cigarettes    Quit date: 01/16/2020    Years since quitting: 3.3   Smokeless tobacco: Never  Vaping Use   Vaping status: Never Used  Substance and Sexual Activity   Alcohol use: Yes    Alcohol/week: 6.0 standard drinks of alcohol    Types: 5 Glasses of wine, 1 Shots of liquor per week   Drug use: Never   Sexual activity: Not Currently    Birth control/protection: Post-menopausal

## 2023-06-03 ENCOUNTER — Other Ambulatory Visit (HOSPITAL_BASED_OUTPATIENT_CLINIC_OR_DEPARTMENT_OTHER): Payer: Self-pay

## 2023-06-03 ENCOUNTER — Other Ambulatory Visit: Payer: Self-pay | Admitting: Family Medicine

## 2023-06-03 DIAGNOSIS — G629 Polyneuropathy, unspecified: Secondary | ICD-10-CM

## 2023-06-03 MED ORDER — GABAPENTIN 300 MG PO CAPS
300.0000 mg | ORAL_CAPSULE | Freq: Two times a day (BID) | ORAL | 0 refills | Status: DC | PRN
  Filled 2023-06-03 – 2023-06-20 (×2): qty 120, 60d supply, fill #0

## 2023-06-03 NOTE — Telephone Encounter (Signed)
 Looks like she went to the OP clinic and they agreed Prolia would be a good option for her. Their note recommended we give it since it has already been approved by our team. Molli Knock to order/give.

## 2023-06-03 NOTE — Telephone Encounter (Signed)
 Looks like the referral was closed since pt wanted to see osteo clinic first. She does want the Prolia injection now. Do you know how we would go about opening the referral back up for her?

## 2023-06-04 NOTE — Telephone Encounter (Signed)
 Patient scheduled for 06/11/23

## 2023-06-04 NOTE — Telephone Encounter (Signed)
 Anything special that needs done before she is scheduled for Prolia here?

## 2023-06-05 NOTE — Telephone Encounter (Signed)
 Patient scheduled.

## 2023-06-06 NOTE — Progress Notes (Deleted)
 Christina Larsen is a 68 y.o. female presents to the office today for Prolia injection, per physician's orders. Prolia 60 mg SQ , was administered *** arm today. Patient tolerated injection. Patient next injection due: 6 months, appt made: No- will schedule in 5 months after benefits are ran again Initial injection: YES Patient supplied: No Cam placed for next injection  ***, CMA

## 2023-06-11 ENCOUNTER — Ambulatory Visit (INDEPENDENT_AMBULATORY_CARE_PROVIDER_SITE_OTHER): Admitting: *Deleted

## 2023-06-11 DIAGNOSIS — M810 Age-related osteoporosis without current pathological fracture: Secondary | ICD-10-CM | POA: Diagnosis not present

## 2023-06-11 MED ORDER — DENOSUMAB 60 MG/ML ~~LOC~~ SOSY: 60.0000 mg | PREFILLED_SYRINGE | SUBCUTANEOUS | Status: DC

## 2023-06-11 NOTE — Progress Notes (Signed)
 Patient here for Prolia injection per physicians orders  Prolia 60 mg SQ , was administered left arm today. Patient tolerated injection.  Patient next injection due: 6 months, appt made:   No- will schedule in 5 months after benefits are ran again  Initial injection: Yes  Did Prolia come from pharmacy (if yes please select patient supplied): No  CAM placed for next injection

## 2023-06-20 ENCOUNTER — Other Ambulatory Visit (HOSPITAL_BASED_OUTPATIENT_CLINIC_OR_DEPARTMENT_OTHER): Payer: Self-pay

## 2023-07-04 ENCOUNTER — Other Ambulatory Visit (HOSPITAL_BASED_OUTPATIENT_CLINIC_OR_DEPARTMENT_OTHER): Payer: Self-pay

## 2023-07-04 ENCOUNTER — Encounter: Payer: Self-pay | Admitting: Family Medicine

## 2023-07-04 ENCOUNTER — Encounter: Payer: Self-pay | Admitting: Cardiovascular Disease

## 2023-07-04 ENCOUNTER — Other Ambulatory Visit (INDEPENDENT_AMBULATORY_CARE_PROVIDER_SITE_OTHER)

## 2023-07-04 DIAGNOSIS — E119 Type 2 diabetes mellitus without complications: Secondary | ICD-10-CM

## 2023-07-04 LAB — COMPREHENSIVE METABOLIC PANEL WITH GFR
ALT: 24 U/L (ref 0–35)
AST: 17 U/L (ref 0–37)
Albumin: 4.5 g/dL (ref 3.5–5.2)
Alkaline Phosphatase: 65 U/L (ref 39–117)
BUN: 14 mg/dL (ref 6–23)
CO2: 29 meq/L (ref 19–32)
Calcium: 9.8 mg/dL (ref 8.4–10.5)
Chloride: 102 meq/L (ref 96–112)
Creatinine, Ser: 0.71 mg/dL (ref 0.40–1.20)
GFR: 86.52 mL/min (ref >60.00)
Glucose, Bld: 162 mg/dL — ABNORMAL HIGH (ref 70–99)
Potassium: 4.9 meq/L (ref 3.5–5.1)
Sodium: 140 meq/L (ref 135–145)
Total Bilirubin: 0.8 mg/dL (ref 0.2–1.2)
Total Protein: 7.2 g/dL (ref 6.0–8.3)

## 2023-07-04 LAB — LIPID PANEL
Cholesterol: 126 mg/dL (ref 0–200)
HDL: 46.5 mg/dL (ref >39.00)
LDL Cholesterol: 37 mg/dL (ref 0–99)
NonHDL: 79.2
Total CHOL/HDL Ratio: 3
Triglycerides: 210 mg/dL — ABNORMAL HIGH (ref 0.0–149.0)
VLDL: 42 mg/dL — ABNORMAL HIGH (ref 0.0–40.0)

## 2023-07-04 LAB — HEMOGLOBIN A1C: Hgb A1c MFr Bld: 8.5 % — ABNORMAL HIGH (ref 4.6–6.5)

## 2023-07-04 MED ORDER — GLIPIZIDE ER 2.5 MG PO TB24
2.5000 mg | ORAL_TABLET | Freq: Every day | ORAL | 3 refills | Status: DC
  Filled 2023-07-04: qty 90, 90d supply, fill #0
  Filled 2023-09-15 (×2): qty 90, 90d supply, fill #1

## 2023-07-04 NOTE — Addendum Note (Signed)
 Addended by: Minna Amass B on: 07/04/2023 01:45 PM   Modules accepted: Orders

## 2023-07-07 ENCOUNTER — Other Ambulatory Visit (HOSPITAL_BASED_OUTPATIENT_CLINIC_OR_DEPARTMENT_OTHER): Payer: Self-pay

## 2023-07-07 ENCOUNTER — Other Ambulatory Visit: Payer: Self-pay | Admitting: Cardiovascular Disease

## 2023-07-07 ENCOUNTER — Other Ambulatory Visit: Payer: Self-pay

## 2023-07-09 ENCOUNTER — Other Ambulatory Visit (HOSPITAL_BASED_OUTPATIENT_CLINIC_OR_DEPARTMENT_OTHER): Payer: Self-pay

## 2023-07-09 ENCOUNTER — Other Ambulatory Visit: Payer: Self-pay

## 2023-07-09 MED ORDER — METOPROLOL SUCCINATE ER 50 MG PO TB24
50.0000 mg | ORAL_TABLET | Freq: Every day | ORAL | 3 refills | Status: AC
  Filled 2023-07-09: qty 90, 90d supply, fill #0
  Filled 2023-10-24: qty 90, 90d supply, fill #1
  Filled 2024-01-21: qty 90, 90d supply, fill #2
  Filled 2024-04-14: qty 90, 90d supply, fill #3

## 2023-07-11 DIAGNOSIS — M546 Pain in thoracic spine: Secondary | ICD-10-CM | POA: Diagnosis not present

## 2023-07-16 DIAGNOSIS — M546 Pain in thoracic spine: Secondary | ICD-10-CM | POA: Diagnosis not present

## 2023-07-18 DIAGNOSIS — M546 Pain in thoracic spine: Secondary | ICD-10-CM | POA: Diagnosis not present

## 2023-07-21 DIAGNOSIS — M546 Pain in thoracic spine: Secondary | ICD-10-CM | POA: Diagnosis not present

## 2023-07-25 DIAGNOSIS — M546 Pain in thoracic spine: Secondary | ICD-10-CM | POA: Diagnosis not present

## 2023-07-29 ENCOUNTER — Other Ambulatory Visit: Payer: Self-pay

## 2023-07-29 ENCOUNTER — Other Ambulatory Visit: Payer: Self-pay | Admitting: Family Medicine

## 2023-07-29 ENCOUNTER — Other Ambulatory Visit (HOSPITAL_BASED_OUTPATIENT_CLINIC_OR_DEPARTMENT_OTHER): Payer: Self-pay

## 2023-07-29 ENCOUNTER — Other Ambulatory Visit: Payer: Self-pay | Admitting: Cardiovascular Disease

## 2023-07-29 DIAGNOSIS — E785 Hyperlipidemia, unspecified: Secondary | ICD-10-CM

## 2023-07-29 MED ORDER — CLOPIDOGREL BISULFATE 75 MG PO TABS
75.0000 mg | ORAL_TABLET | Freq: Every day | ORAL | 3 refills | Status: AC
  Filled 2023-07-29: qty 90, 90d supply, fill #0
  Filled 2023-10-24: qty 90, 90d supply, fill #1
  Filled 2024-01-21: qty 90, 90d supply, fill #2

## 2023-07-29 MED ORDER — ATORVASTATIN CALCIUM 80 MG PO TABS
80.0000 mg | ORAL_TABLET | Freq: Every day | ORAL | 3 refills | Status: AC
  Filled 2023-07-29: qty 90, 90d supply, fill #0
  Filled 2023-10-24: qty 90, 90d supply, fill #1
  Filled 2024-02-14: qty 90, 90d supply, fill #2

## 2023-08-01 DIAGNOSIS — M546 Pain in thoracic spine: Secondary | ICD-10-CM | POA: Diagnosis not present

## 2023-08-11 DIAGNOSIS — M546 Pain in thoracic spine: Secondary | ICD-10-CM | POA: Diagnosis not present

## 2023-08-13 DIAGNOSIS — M546 Pain in thoracic spine: Secondary | ICD-10-CM | POA: Diagnosis not present

## 2023-08-15 ENCOUNTER — Encounter: Payer: Self-pay | Admitting: Cardiovascular Disease

## 2023-08-16 DIAGNOSIS — M546 Pain in thoracic spine: Secondary | ICD-10-CM | POA: Diagnosis not present

## 2023-08-18 DIAGNOSIS — M546 Pain in thoracic spine: Secondary | ICD-10-CM | POA: Diagnosis not present

## 2023-08-20 DIAGNOSIS — M546 Pain in thoracic spine: Secondary | ICD-10-CM | POA: Diagnosis not present

## 2023-08-25 DIAGNOSIS — M546 Pain in thoracic spine: Secondary | ICD-10-CM | POA: Diagnosis not present

## 2023-08-29 DIAGNOSIS — M546 Pain in thoracic spine: Secondary | ICD-10-CM | POA: Diagnosis not present

## 2023-09-01 ENCOUNTER — Telehealth: Payer: Self-pay | Admitting: Family Medicine

## 2023-09-01 DIAGNOSIS — M546 Pain in thoracic spine: Secondary | ICD-10-CM | POA: Diagnosis not present

## 2023-09-01 NOTE — Telephone Encounter (Signed)
 Copied from CRM [id number]. Topic: Medicare AWV >> Sep 01, 2023 11:22 AM Nathanel DEL wrote: Reason for CRM: LVM 09/01/2023 to schedule AWV. Please schedule Virtual or Telehealth visits ONLY.   Nathanel Paschal; Care Guide Ambulatory Clinical Support [place] [facility] Direct Dial: [phone]

## 2023-09-05 DIAGNOSIS — M546 Pain in thoracic spine: Secondary | ICD-10-CM | POA: Diagnosis not present

## 2023-09-08 DIAGNOSIS — M546 Pain in thoracic spine: Secondary | ICD-10-CM | POA: Diagnosis not present

## 2023-09-10 DIAGNOSIS — M546 Pain in thoracic spine: Secondary | ICD-10-CM | POA: Diagnosis not present

## 2023-09-15 ENCOUNTER — Other Ambulatory Visit: Payer: Self-pay

## 2023-09-15 ENCOUNTER — Other Ambulatory Visit (HOSPITAL_BASED_OUTPATIENT_CLINIC_OR_DEPARTMENT_OTHER): Payer: Self-pay

## 2023-09-17 DIAGNOSIS — M546 Pain in thoracic spine: Secondary | ICD-10-CM | POA: Diagnosis not present

## 2023-09-19 DIAGNOSIS — M546 Pain in thoracic spine: Secondary | ICD-10-CM | POA: Diagnosis not present

## 2023-09-22 DIAGNOSIS — M546 Pain in thoracic spine: Secondary | ICD-10-CM | POA: Diagnosis not present

## 2023-09-26 DIAGNOSIS — M546 Pain in thoracic spine: Secondary | ICD-10-CM | POA: Diagnosis not present

## 2023-09-29 DIAGNOSIS — M546 Pain in thoracic spine: Secondary | ICD-10-CM | POA: Diagnosis not present

## 2023-10-01 DIAGNOSIS — M546 Pain in thoracic spine: Secondary | ICD-10-CM | POA: Diagnosis not present

## 2023-10-06 DIAGNOSIS — M546 Pain in thoracic spine: Secondary | ICD-10-CM | POA: Diagnosis not present

## 2023-10-09 DIAGNOSIS — M546 Pain in thoracic spine: Secondary | ICD-10-CM | POA: Diagnosis not present

## 2023-10-17 DIAGNOSIS — M546 Pain in thoracic spine: Secondary | ICD-10-CM | POA: Diagnosis not present

## 2023-10-20 ENCOUNTER — Other Ambulatory Visit: Payer: Self-pay | Admitting: Family Medicine

## 2023-10-20 ENCOUNTER — Other Ambulatory Visit (HOSPITAL_BASED_OUTPATIENT_CLINIC_OR_DEPARTMENT_OTHER): Payer: Self-pay

## 2023-10-20 DIAGNOSIS — M546 Pain in thoracic spine: Secondary | ICD-10-CM | POA: Diagnosis not present

## 2023-10-20 DIAGNOSIS — G629 Polyneuropathy, unspecified: Secondary | ICD-10-CM

## 2023-10-20 MED ORDER — GABAPENTIN 300 MG PO CAPS
300.0000 mg | ORAL_CAPSULE | Freq: Two times a day (BID) | ORAL | 0 refills | Status: DC | PRN
  Filled 2023-10-20: qty 60, 30d supply, fill #0

## 2023-10-22 DIAGNOSIS — M546 Pain in thoracic spine: Secondary | ICD-10-CM | POA: Diagnosis not present

## 2023-10-27 DIAGNOSIS — M546 Pain in thoracic spine: Secondary | ICD-10-CM | POA: Diagnosis not present

## 2023-10-31 DIAGNOSIS — M546 Pain in thoracic spine: Secondary | ICD-10-CM | POA: Diagnosis not present

## 2023-11-03 ENCOUNTER — Encounter: Payer: Self-pay | Admitting: Family Medicine

## 2023-11-03 DIAGNOSIS — Z1211 Encounter for screening for malignant neoplasm of colon: Secondary | ICD-10-CM

## 2023-11-04 DIAGNOSIS — M546 Pain in thoracic spine: Secondary | ICD-10-CM | POA: Diagnosis not present

## 2023-11-05 ENCOUNTER — Other Ambulatory Visit (HOSPITAL_BASED_OUTPATIENT_CLINIC_OR_DEPARTMENT_OTHER): Payer: Self-pay

## 2023-11-06 ENCOUNTER — Other Ambulatory Visit (HOSPITAL_BASED_OUTPATIENT_CLINIC_OR_DEPARTMENT_OTHER): Payer: Self-pay

## 2023-11-06 MED ORDER — PREVIDENT 5000 BOOSTER PLUS 1.1 % DT PSTE
1.0000 | PASTE | Freq: Two times a day (BID) | DENTAL | 5 refills | Status: AC
  Filled 2023-11-06: qty 100, 25d supply, fill #0

## 2023-11-07 ENCOUNTER — Telehealth: Payer: Self-pay

## 2023-11-07 DIAGNOSIS — M546 Pain in thoracic spine: Secondary | ICD-10-CM | POA: Diagnosis not present

## 2023-11-07 NOTE — Telephone Encounter (Signed)
 Prolia  VOB initiated via [email or web address]  Next Prolia  inj DUE: 12/11/23

## 2023-11-11 ENCOUNTER — Other Ambulatory Visit (HOSPITAL_COMMUNITY): Payer: Self-pay

## 2023-11-11 NOTE — Telephone Encounter (Signed)
   Effective 11/10/23, Prolia  is moving to non-formulary status on [employer] Part D. Jubbonti is now formulary (Tier 4) with a quantity limit of 2 ml per 365 days. Jubbonti is interchangeable with Prolia .

## 2023-11-11 NOTE — Telephone Encounter (Addendum)
 Pt ready for scheduling for PROLIA  on or after : 12/11/23   Option# 2- Med Obtained from pharmacy:  Pharmacy benefit: Copay [id number] (Paid to pharmacy) Admin Fee: 0% (Pay at clinic)  Prior Auth: N/A PA# Expiration Date:   # of doses approved:   If patient wants fill through the pharmacy benefit please send prescription to: [facility], and include estimated need by date in rx notes. Pharmacy will ship medication directly to the office.  Patient NOT eligible for Prolia  Copay Card. Copay Card can make patient's cost as little as $25. Link to apply: https://www.amgensupportplus.com/copay  ** This summary of benefits is an estimation of the patient's out-of-pocket cost. Exact cost may very based on individual plan coverage.

## 2023-11-12 ENCOUNTER — Telehealth: Payer: Self-pay | Admitting: Gastroenterology

## 2023-11-12 DIAGNOSIS — M546 Pain in thoracic spine: Secondary | ICD-10-CM | POA: Diagnosis not present

## 2023-11-12 NOTE — Telephone Encounter (Signed)
 Good Morning Dr San   Supervising MD am  We received a referral for patient to be seen for a colonoscopy. Previous procedure available for review in epic. Please review and advise on scheduling.   Thank you

## 2023-11-13 ENCOUNTER — Other Ambulatory Visit (HOSPITAL_COMMUNITY): Payer: Self-pay

## 2023-11-14 DIAGNOSIS — M546 Pain in thoracic spine: Secondary | ICD-10-CM | POA: Diagnosis not present

## 2023-11-18 DIAGNOSIS — M546 Pain in thoracic spine: Secondary | ICD-10-CM | POA: Diagnosis not present

## 2023-11-19 ENCOUNTER — Encounter: Payer: Self-pay | Admitting: Gastroenterology

## 2023-11-20 ENCOUNTER — Encounter: Payer: Self-pay | Admitting: Family Medicine

## 2023-11-20 NOTE — Telephone Encounter (Signed)
Patient scheduled for pre visit and procedure.

## 2023-11-21 DIAGNOSIS — M546 Pain in thoracic spine: Secondary | ICD-10-CM | POA: Diagnosis not present

## 2023-11-24 DIAGNOSIS — M546 Pain in thoracic spine: Secondary | ICD-10-CM | POA: Diagnosis not present

## 2023-11-26 DIAGNOSIS — M546 Pain in thoracic spine: Secondary | ICD-10-CM | POA: Diagnosis not present

## 2023-11-28 ENCOUNTER — Ambulatory Visit: Attending: Cardiovascular Disease | Admitting: Cardiovascular Disease

## 2023-11-28 ENCOUNTER — Encounter: Payer: Self-pay | Admitting: Cardiovascular Disease

## 2023-11-28 VITALS — BP 130/84 | HR 84 | Ht 65.0 in | Wt 165.0 lb

## 2023-11-28 DIAGNOSIS — E118 Type 2 diabetes mellitus with unspecified complications: Secondary | ICD-10-CM

## 2023-11-28 DIAGNOSIS — E782 Mixed hyperlipidemia: Secondary | ICD-10-CM

## 2023-11-28 DIAGNOSIS — I10 Essential (primary) hypertension: Secondary | ICD-10-CM | POA: Diagnosis not present

## 2023-11-28 DIAGNOSIS — I2510 Atherosclerotic heart disease of native coronary artery without angina pectoris: Secondary | ICD-10-CM | POA: Diagnosis not present

## 2023-11-28 NOTE — Assessment & Plan Note (Signed)
 The patient is clinically stable now 4 years out from her acute coronary syndrome.  I recommended that she stop aspirin  today.  She will remain on clopidogrel  for antiplatelet therapy.  She will continue on a high intensity statin drug with atorvastatin  80 mg daily.  We discussed the mixed data regarding calcium  supplementation in patients with coronary artery disease.  Some studies have raised concern about cardiovascular risk in postmenopausal otherwise healthy women.  Others have shown no increased risk.  Due to her primary concern being cardiac health, she has decided to avoid calcium  supplementation at this time.  Follow-up 1 year.

## 2023-11-28 NOTE — Patient Instructions (Signed)
 Medication Instructions:  STOP Aspirin   *If you need a refill on your cardiac medications before your next appointment, please call your pharmacy*  Lab Work: None ordered today. If you have labs (blood work) drawn today and your tests are completely normal, you will receive your results only by: MyChart Message (if you have MyChart) OR A paper copy in the mail If you have any lab test that is abnormal or we need to change your treatment, we will call you to review the results.  Testing/Procedures: None ordered today.  Follow-Up: At [facility], you and your health needs are our priority.  As part of our continuing mission to provide you with exceptional heart care, our providers are all part of one team.  This team includes your primary Cardiologist (physician) and Advanced Practice Providers or APPs (Physician Assistants and Nurse Practitioners) who all work together to provide you with the care you need, when you need it.  Your next appointment:   1 year(s)  Provider:   Ozell Fell, MD

## 2023-11-28 NOTE — Progress Notes (Signed)
 Cardiology Office Note:    Date:  11/28/2023   ID:  Christina Larsen, DOB 1955-07-13, MRN [id number]  PCP:  Almarie Waddell NOVAK, NP   [facility] Providers Cardiologist:  Debby Sor, MD (Inactive)     Referring MD: Almarie Waddell NOVAK, NP   Chief Complaint  Patient presents with   Coronary Artery Disease    History of Present Illness:    Christina Larsen is a 68 y.o. female with a hx of coronary artery disease, presenting for follow-up evaluation.  She has been previously followed by Dr. Sor.  She presented with non-STEMI in 2021 and cardiac catheterization demonstrated chronic occlusion of the RCA and acute thrombotic occlusion of the circumflex.  She was treated with aspiration thrombectomy and stenting of the proximal circumflex.  She had moderate stenosis of the ramus intermedius and nonobstructive LAD stenosis.  Comorbid conditions include diabetes, hypertension, hyperlipidemia, and tobacco abuse.  LV function has been preserved with LVEF 55%.  The patient is here alone today. States that her coronary event occurred after her second rotator cuff surgery. She's done well since that time, with no recurrent ischemic events. She reports no chest pain, chest pressure, or shortness of breath. She walks regularly and has no symptoms with a 30 minute walk.  She inquires about taking calcium  supplementation.  She states that she has osteopenia but not osteoporosis.  She is mostly concerned about her cardiac risk.  Current Medications: Current Meds  Medication Sig   acetaminophen  (TYLENOL ) 500 MG tablet Take 500-1,000 mg by mouth every 6 (six) hours as needed (pain.).   atorvastatin  (LIPITOR ) 80 MG tablet Take 1 tablet (80 mg total) by mouth daily.   cholecalciferol (VITAMIN D3) 25 MCG (1000 UNIT) tablet Take 1,000 Units by mouth in the morning.   clopidogrel  (PLAVIX ) 75 MG tablet Take 1 tablet (75 mg total) by mouth daily.   Dulaglutide  (TRULICITY ) 4.5 MG/0.5ML SOPN Inject 4.5 mg as directed  once a week.   gabapentin  (NEURONTIN ) 300 MG capsule Take 1 capsule (300 mg total) by mouth every 12 (twelve) hours as needed. APPT FOR REFILLS   glipiZIDE  (GLUCOTROL  XL) 2.5 MG 24 hr tablet Take 1 tablet (2.5 mg total) by mouth daily with breakfast.   metFORMIN  (GLUCOPHAGE ) 1000 MG tablet Take 1 tablet (1,000 mg total) by mouth in the morning and at bedtime.   metFORMIN  (GLUCOPHAGE ) 500 MG tablet Take 1 tablet (500 mg total) by mouth daily with breakfast. Take with the 1,000 mg tabs to total 1,500 mg in the morning and 1,000 mg in the evening.   metoprolol  succinate (TOPROL  XL) 50 MG 24 hr tablet Take 1 tablet (50 mg total) by mouth daily.   Multiple Vitamin (MULTIVITAMIN) tablet Take 1 tablet by mouth in the morning.   nitroGLYCERIN  (NITROSTAT ) 0.4 MG SL tablet Place 1 tablet (0.4 mg total) under the tongue every 5 (five) minutes x 3 doses as needed. Call 911 after 3rd dose if no relief   Sodium Fluoride  (PREVIDENT  5000 BOOSTER PLUS) 1.1 % PSTE Apply a small amount to teeth twice a day. Use pea-sized amount 2 times daily. Brush for two minutes. Do not eat/drink for 30 minutes post brushing.   [DISCONTINUED] aspirin  EC 81 MG tablet Take 81 mg by mouth in the morning. Swallow whole.   Current Facility-Administered Medications for the 11/28/23 encounter (Office Visit) with Wonda Sharper, MD  Medication   [START ON 12/08/2023] denosumab  (PROLIA ) injection 60 mg     Allergies:   Hydrocodone-acetaminophen   ROS:   Please see the history of present illness.    All other systems reviewed and are negative.  EKGs/Labs/Other Studies Reviewed:    The following studies were reviewed today: Cardiac Studies & Procedures   ______________________________________________________________________________________________ CARDIAC CATHETERIZATION  CARDIAC CATHETERIZATION 01/17/2020  Conclusion Conclusions: 1. Significant two-vessel coronary artery disease with occlusions of the proximal LCx (acute with  heavy thrombus burden) and mid RCA (chronic with left-to-right collaterals).  Moderate, non-obstructive coronary artery disease is also present in the ramus intermedius and distal LCx (codominant vessel). 2. Mid LAD myocardial bridge. 3. Mildly elevated left ventricular filling pressure with lateral wall hypokinesis and mildly reduced contraction. 4. Successful PCI to proximal LCx with aspiration thrombectomy and placement of Resolute Onyx 3.0 x 18 mm drug-eluting stent (postdilated to 3.3 mm) with 0% residual stenosis and TIMI-3 flow.  Recommendations: 1. Dual antiplatelet therapy with aspirin  and ticagrelor  for at least 12 months. 2. Aggressive secondary prevention, including high-intensity statin therapy and smoking cessation. 3. Medical management of chronic total occlusion of mid RCA as well as moderate ramus/distal LCx disease. 4. Remove right femoral artery sheath with manual compression two hours after discontinuation of bivalirudin .  Lonni Hanson, MD [facility]  Findings Coronary Findings Diagnostic  Dominance: Co-dominant  Left Main Vessel is large.  Left Anterior Descending There is mid myocardial bridging present. Mid LAD lesion is 20% stenosed.  First Diagonal Branch Vessel is small in size.  Ramus Intermedius Vessel is large. Ramus lesion is 60% stenosed.  Left Circumflex Vessel is large. Ost Cx to Prox Cx lesion is 20% stenosed. Prox Cx lesion is 100% stenosed. The lesion is heavily thrombotic. Dist Cx lesion is 50% stenosed with 50% stenosed side branch in LPAV.  First Obtuse Marginal Branch Vessel is moderate in size.  Second Obtuse Marginal Branch Vessel is small in size. 2nd Mrg lesion is 90% stenosed.  First Left Posterolateral Branch Vessel is small in size.  Second Left Posterolateral Branch Vessel is small in size.  Right Coronary Artery Vessel is moderate in size. Prox RCA-1 lesion is 50% stenosed. Prox RCA-2 lesion is 50%  stenosed. Mid RCA lesion is 100% stenosed. The lesion is chronically occluded with left-to-right collateral flow.  Right Posterior Descending Artery Collaterals RPDA filled by collaterals from 1st Sept.  Collaterals RPDA filled by collaterals from 2nd Sept.  Intervention  Prox Cx lesion Thrombectomy CATH LAUNCHER 6FR EBU 3.75 guide catheter was inserted. WIRE PT2 MS 185 guidewire was used to cross lesion. Aspiration thrombectomy performed using a CATH EXTRAC PRONTO 5.56F 138CM. 2 passes taken. Stent CATH LAUNCHER 6FR EBU 3.75 guide catheter was inserted. Lesion crossed with guidewire using a WIRE PT2 MS 185. Pre-stent angioplasty was performed using a BALLOON EMERGE MR 2.5X12. Maximum pressure:  12 atm. A drug-eluting stent was successfully placed using a STENT RESOLUTE ONYX 3.0X18. Maximum pressure: 16 atm. Post-stent angioplasty was performed using a BALLOON SAPPHIRE Bell 3.25X15. Maximum pressure:  16 atm. Post-Intervention Lesion Assessment The intervention was successful. Pre-interventional TIMI flow is 0. Post-intervention TIMI flow is 3. No complications occurred at this lesion. There is a 0% residual stenosis post intervention.     ECHOCARDIOGRAM  ECHOCARDIOGRAM COMPLETE 01/17/2020  Narrative ECHOCARDIOGRAM REPORT    Patient Name:   AKYAH LAGRANGE Date of Exam: 01/17/2020 Medical Rec #:  [id number]     Height:       65.0 in Accession #:    [id number]    Weight:       149.0 lb Date  of Birth:  1955-07-05      BSA:          1.746 m Patient Age:    64 years      BP:           102/72 mmHg Patient Gender: F             HR:           51 bpm. Exam Location:  Inpatient  Procedure: 2D Echo, Cardiac Doppler, Color Doppler and Intracardiac Opacification Agent  Indications:    NSTEMI I21.4  History:        Patient has no prior history of Echocardiogram examinations. Risk Factors:Diabetes, Dyslipidemia and Current Smoker.  Sonographer:    Recardo Hedge RDCS Referring Phys: [id number]  SCOTT W ROSE  IMPRESSIONS   1. Left ventricular ejection fraction, by estimation, is 55%. The left ventricle has normal function. The left ventricle has no regional wall motion abnormalities. Left ventricular diastolic parameters were normal. 2. Right ventricular systolic function is normal. The right ventricular size is normal. 3. The mitral valve is normal in structure. No evidence of mitral valve regurgitation. No evidence of mitral stenosis. 4. The aortic valve is normal in structure. Aortic valve regurgitation is not visualized. Mild aortic valve sclerosis is present, with no evidence of aortic valve stenosis. 5. Aortic dilatation noted. There is mild dilatation of the ascending aorta, measuring 40 mm. 6. The inferior vena cava is normal in size with greater than 50% respiratory variability, suggesting right atrial pressure of 3 mmHg.  FINDINGS Left Ventricle: Left ventricular ejection fraction, by estimation, is 55%. The left ventricle has normal function. The left ventricle has no regional wall motion abnormalities. Definity  contrast agent was given IV to delineate the left ventricular endocardial borders. The left ventricular internal cavity size was normal in size. There is no left ventricular hypertrophy. Left ventricular diastolic parameters were normal.  Right Ventricle: The right ventricular size is normal. No increase in right ventricular wall thickness. Right ventricular systolic function is normal.  Left Atrium: Left atrial size was normal in size.  Right Atrium: Right atrial size was normal in size.  Pericardium: There is no evidence of pericardial effusion.  Mitral Valve: The mitral valve is normal in structure. No evidence of mitral valve regurgitation. No evidence of mitral valve stenosis.  Tricuspid Valve: The tricuspid valve is normal in structure. Tricuspid valve regurgitation is not demonstrated. No evidence of tricuspid stenosis.  Aortic Valve: The aortic valve is  normal in structure. Aortic valve regurgitation is not visualized. Mild aortic valve sclerosis is present, with no evidence of aortic valve stenosis.  Pulmonic Valve: The pulmonic valve was normal in structure. Pulmonic valve regurgitation is not visualized. No evidence of pulmonic stenosis.  Aorta: The aortic root is normal in size and structure and aortic dilatation noted. There is mild dilatation of the ascending aorta, measuring 40 mm.  Venous: The inferior vena cava is normal in size with greater than 50% respiratory variability, suggesting right atrial pressure of 3 mmHg.  IAS/Shunts: No atrial level shunt detected by color flow Doppler.   LEFT VENTRICLE PLAX 2D LVIDd:         4.50 cm      Diastology LVIDs:         3.50 cm      LV e' medial:    5.98 cm/s LV PW:         0.90 cm      LV E/e' medial:  11.3 LV IVS:        0.90 cm      LV e' lateral:   5.57 cm/s LVOT diam:     2.20 cm      LV E/e' lateral: 12.1 LV SV:         72 LV SV Index:   41 LVOT Area:     3.80 cm  LV Volumes (MOD) LV vol d, MOD A2C: 129.0 ml LV vol d, MOD A4C: 143.0 ml LV vol s, MOD A2C: 70.1 ml LV vol s, MOD A4C: 62.1 ml LV SV MOD A2C:     58.9 ml LV SV MOD A4C:     143.0 ml LV SV MOD BP:      64.9 ml  RIGHT VENTRICLE RV S prime:     10.20 cm/s TAPSE (M-mode): 2.1 cm  LEFT ATRIUM             Index       RIGHT ATRIUM           Index LA diam:        2.80 cm 1.60 cm/m  RA Area:     10.60 cm LA Vol (A2C):   34.4 ml 19.71 ml/m RA Volume:   24.90 ml  14.27 ml/m LA Vol (A4C):   33.3 ml 19.08 ml/m LA Biplane Vol: 37.7 ml 21.60 ml/m AORTIC VALVE LVOT Vmax:   87.70 cm/s LVOT Vmean:  57.700 cm/s LVOT VTI:    0.190 m  AORTA Ao Root diam: 3.60 cm Ao Asc diam:  4.00 cm  MITRAL VALVE MV Area (PHT): 3.74 cm    SHUNTS MV Decel Time: 203 msec    Systemic VTI:  0.19 m MV E velocity: 67.30 cm/s  Systemic Diam: 2.20 cm MV A velocity: 52.30 cm/s MV E/A ratio:  1.29  Maude Emmer MD Electronically  signed by Maude Emmer MD Signature Date/Time: 01/17/2020/9:34:52 AM    Final          ______________________________________________________________________________________________      EKG:        Recent Labs: 02/25/2023: Hemoglobin 14.5; Platelets 216.0 04/24/2023: TSH 2.54 07/04/2023: ALT 24; BUN 14; Creatinine, Ser 0.71; Potassium 4.9; Sodium 140  Recent Lipid Panel    Component Value Date/Time   CHOL 126 07/04/2023 0713   CHOL 114 04/20/2021 0811   TRIG 210.0 (H) 07/04/2023 0713   HDL 46.50 07/04/2023 0713   HDL 47 04/20/2021 0811   CHOLHDL 3 07/04/2023 0713   VLDL 42.0 (H) 07/04/2023 0713   LDLCALC 37 07/04/2023 0713   LDLCALC 38 04/20/2021 0811     Risk Assessment/Calculations:                Physical Exam:    VS:  BP 130/84   Pulse 84   Ht 5' 5 (1.651 m)   Wt 165 lb (74.8 kg)   SpO2 95%   BMI 27.46 kg/m     Wt Readings from Last 3 Encounters:  11/28/23 165 lb (74.8 kg)  06/02/23 160 lb (72.6 kg)  05/19/23 162 lb 3.2 oz (73.6 kg)     GEN:  Well nourished, well developed in no acute distress HEENT: Normal NECK: No JVD; No carotid bruits LYMPHATICS: No lymphadenopathy CARDIAC: RRR, no murmurs, rubs, gallops RESPIRATORY:  Clear to auscultation without rales, wheezing or rhonchi  ABDOMEN: Soft, non-tender, non-distended MUSCULOSKELETAL:  No edema; No deformity  SKIN: Warm and dry NEUROLOGIC:  Alert and oriented x 3 PSYCHIATRIC:  Normal affect  Assessment & Plan Coronary artery disease involving native coronary artery of native heart without angina pectoris The patient is clinically stable now 4 years out from her acute coronary syndrome.  I recommended that she stop aspirin  today.  She will remain on clopidogrel  for antiplatelet therapy.  She will continue on a high intensity statin drug with atorvastatin  80 mg daily.  We discussed the mixed data regarding calcium  supplementation in patients with coronary artery disease.  Some studies have  raised concern about cardiovascular risk in postmenopausal otherwise healthy women.  Others have shown no increased risk.  Due to her primary concern being cardiac health, she has decided to avoid calcium  supplementation at this time.  Follow-up 1 year. Mixed hyperlipidemia Treated with atorvastatin .  LDL cholesterol is 37.  Continue current management. Essential hypertension Blood pressure is well-controlled on metoprolol  succinate. Type 2 diabetes mellitus with complication, without long-term current use of insulin  (HCC) Treated with Trulicity , metformin , and glipizide .  Followed by her primary care physician.      Medication Adjustments/Labs and Tests Ordered: Current medicines are reviewed at length with the patient today.  Concerns regarding medicines are outlined above.  No orders of the defined types were placed in this encounter.  No orders of the defined types were placed in this encounter.   Patient Instructions  Medication Instructions:  STOP Aspirin    *If you need a refill on your cardiac medications before your next appointment, please call your pharmacy*  Lab Work: None ordered today. If you have labs (blood work) drawn today and your tests are completely normal, you will receive your results only by: MyChart Message (if you have MyChart) OR A paper copy in the mail If you have any lab test that is abnormal or we need to change your treatment, we will call you to review the results.  Testing/Procedures: None ordered today.  Follow-Up: At [facility], you and your health needs are our priority.  As part of our continuing mission to provide you with exceptional heart care, our providers are all part of one team.  This team includes your primary Cardiologist (physician) and Advanced Practice Providers or APPs (Physician Assistants and Nurse Practitioners) who all work together to provide you with the care you need, when you need it.  Your next appointment:    1 year(s)  Provider:   Ozell Fell, MD      Signed, Ozell Fell, MD  11/28/2023 11:08 AM    [facility]

## 2023-11-28 NOTE — Assessment & Plan Note (Signed)
 Treated with atorvastatin .  LDL cholesterol is 37.  Continue current management.

## 2023-12-01 DIAGNOSIS — M546 Pain in thoracic spine: Secondary | ICD-10-CM | POA: Diagnosis not present

## 2023-12-04 DIAGNOSIS — M546 Pain in thoracic spine: Secondary | ICD-10-CM | POA: Diagnosis not present

## 2023-12-05 NOTE — Telephone Encounter (Signed)
 Dr. San I called this patient and confirmed she is TAKING PLAVIX .  Do you prefer her to have an OV prior to her procedure or are you okay with her direct colon for surveillance as you had previously advised? (Protocol states she needs an OV-however, taking your recommendation into advisement) Please advise Thank you Bre, PV RN

## 2023-12-08 DIAGNOSIS — M546 Pain in thoracic spine: Secondary | ICD-10-CM | POA: Diagnosis not present

## 2023-12-08 NOTE — Telephone Encounter (Signed)
 Patient has been cleared for direct procedure at [facility] per Dr. San. Patient is on Plavix , which requires cardiac clearance prior to her procedure scheduled for 01/09/24.   RN contacting Dr. Rennis RN to obtain cardiac clearance for this patient.

## 2023-12-09 ENCOUNTER — Telehealth: Payer: Self-pay

## 2023-12-09 NOTE — Telephone Encounter (Signed)
 Yes she is at low risk of holding plavix  5 days for the procedure. OK to do this. thanks

## 2023-12-09 NOTE — Telephone Encounter (Signed)
 [facility] Pre-operative Risk Assessment     Request for surgical clearance:     Endoscopy Procedure  What type of surgery is being performed?     Colonoscopy  When is this surgery scheduled?     01/09/2024  What type of clearance is required ?   Pharmacy/Medical  Are there any medications that need to be held prior to surgery and how long? Plavix / 5 days  Practice name and name of physician performing surgery?       Gastroenterology/ Dr. San  What is your office phone and fax number?      Phone- [phone]  Fax- [phone]  Anesthesia type (None, local, MAC, general) ?       MAC   Please route your response to Elspeth Munroe RN

## 2023-12-09 NOTE — Telephone Encounter (Signed)
 Medical/ Pharmacy clearance requested through epic.

## 2023-12-09 NOTE — Telephone Encounter (Signed)
 Dr. Wonda, he recently seen this patient about 2 weeks ago.  She was doing well at the time and there was taking 30-minute walks.  Her last PCI was over 4 years ago in November 2021 at which time she received DES to proximal left circumflex artery, she also had CTO of mid RCA with left-to-right collaterals.  She has mid LAD myocardial bridging.  Would you be okay to clear her for the upcoming colonoscopy and allow her to hold Plavix  for 5 days prior to the procedure  Please forward your response to P CV DIV PREOP

## 2023-12-09 NOTE — Telephone Encounter (Signed)
   Primary Cardiologist: Debby Sor, MD (Inactive)  Chart reviewed as part of pre-operative protocol coverage. Given past medical history and time since last visit, based on ACC/AHA guidelines, Christina Larsen would be at acceptable risk for the planned procedure without further cardiovascular testing.   Patient should contact our office if she is having new symptoms that are concerning from a cardiac perspective to arrange a follow-up appointment.    Per office protocol, she may hold Plavix  for 5 days prior to procedure and should resume as soon as hemodynamically stable postoperatively.  I will route this recommendation to the requesting party via Epic fax function and remove from pre-op pool.  Please call with questions.  [name], NP-C 12/09/2023, 12:47 PM [address] [place], [place] [address] Office [phone] Fax [phone]

## 2023-12-10 ENCOUNTER — Ambulatory Visit: Admitting: Family Medicine

## 2023-12-10 ENCOUNTER — Other Ambulatory Visit (HOSPITAL_BASED_OUTPATIENT_CLINIC_OR_DEPARTMENT_OTHER): Payer: Self-pay

## 2023-12-10 ENCOUNTER — Encounter: Payer: Self-pay | Admitting: Family Medicine

## 2023-12-10 VITALS — BP 136/77 | HR 80 | Ht 65.0 in | Wt 167.0 lb

## 2023-12-10 DIAGNOSIS — Z9009 Acquired absence of other part of head and neck: Secondary | ICD-10-CM | POA: Diagnosis not present

## 2023-12-10 DIAGNOSIS — E785 Hyperlipidemia, unspecified: Secondary | ICD-10-CM | POA: Diagnosis not present

## 2023-12-10 DIAGNOSIS — I2510 Atherosclerotic heart disease of native coronary artery without angina pectoris: Secondary | ICD-10-CM | POA: Diagnosis not present

## 2023-12-10 DIAGNOSIS — Z23 Encounter for immunization: Secondary | ICD-10-CM

## 2023-12-10 DIAGNOSIS — E119 Type 2 diabetes mellitus without complications: Secondary | ICD-10-CM | POA: Diagnosis not present

## 2023-12-10 DIAGNOSIS — Z7984 Long term (current) use of oral hypoglycemic drugs: Secondary | ICD-10-CM | POA: Diagnosis not present

## 2023-12-10 DIAGNOSIS — R5383 Other fatigue: Secondary | ICD-10-CM | POA: Diagnosis not present

## 2023-12-10 DIAGNOSIS — Z7985 Long-term (current) use of injectable non-insulin antidiabetic drugs: Secondary | ICD-10-CM | POA: Diagnosis not present

## 2023-12-10 MED ORDER — COMIRNATY 30 MCG/0.3ML IM SUSY
0.3000 mL | PREFILLED_SYRINGE | Freq: Once | INTRAMUSCULAR | 0 refills | Status: AC
  Filled 2023-12-10: qty 0.3, 1d supply, fill #0

## 2023-12-10 NOTE — Assessment & Plan Note (Signed)
 Surgeon requested we recheck TSH at this visit. Asymptomatic other than mild fatigue.

## 2023-12-10 NOTE — Assessment & Plan Note (Signed)
-  Medication management: continue lipitor 80 mg and 1400 mg fish oil daily -Repeat lipids in 2-3 months (just recently started fish oil) -Lifestyle factors for lowering cholesterol include: Diet therapy - heart-healthy diet rich in fruits, veggies, fiber-rich whole grains, lean meats, chicken, fish (at least twice a week), fat-free or 1% dairy products; foods low in saturated/trans fats, cholesterol, sodium, and sugar. Mediterranean diet has shown to be very heart healthy. Regular exercise - recommend at least 30 minutes a day, 5 times per week Weight management

## 2023-12-10 NOTE — Progress Notes (Signed)
 Established Patient Office Visit  Subjective   Patient ID: Christina Larsen, female    DOB: 1956-01-13  Age: 68 y.o. MRN: [id number]  No chief complaint on file.   HPI  Patient is in for routine follow-up. Reports she has been feeling well overall.    HYPERLIPIDEMIA: - medications: Lipitor  80 mg daily, 1400 mg fish oil - compliance: good - medication SEs: no - Patient saw cardiology on 04/15/22 and they started her on Vascepa  1 g BID with instructions to repeat labs in 3 months and if triglycerides are still higher than 150, increase Vascepa  to 2 g BID. Cost was going to be $200 which she could not afford. She contacted their office and they recommended getting 1400 mg fish oil instead and have us  continue to monitor. Stable at lats check 3 months ago and she is scheduled to see them again next month. Defer recheck today. The ASCVD Risk score (Arnett DK, et al., 2019) failed to calculate for the following reasons:   Risk score cannot be calculated because patient has a medical history suggesting prior/existing ASCVD   DIABETES: - Checking BG at home: rarely - Medications: Trulicity  4.5 mg weekly, metformin  1,500 mg AM and 1,000 mg PM, glipizide  XR 2.5 mg daily - Compliance: good - Eye exam: pt to schedule - Microalbumin: UTD - Denies symptoms of hypoglycemia, polyuria, polydipsia, numbness extremities, foot ulcers/trauma, wounds that are not healing, medication side effects  Lab Results  Component Value Date   HGBA1C 8.5 (H) 07/04/2023     CAD/NSTEMI: following with cardiology every 6 months - Medications: metoprolol  succinate 50 mg daily, Plavix  75mg  daily, Lipitor  80 mg daily - Compliance: good - Checking BP at home: not consistently  - Denies any SOB, recurrent headaches, CP, vision changes, LE edema, dizziness, palpitations, or medication side effects. - Diet: working on a healthy diet, cut back on carbs, increased veggies - Exercise: going to the gym a few times per  week   NEUROPATHY: -Stable on gabapentin  nightly        ROS All review of systems negative except what is listed in the HPI    Objective:     BP 136/77   Pulse 80   Ht 5' 5 (1.651 m)   Wt 167 lb (75.8 kg)   SpO2 99%   BMI 27.79 kg/m    Physical Exam Vitals reviewed.  Constitutional:      Appearance: Normal appearance.  Cardiovascular:     Rate and Rhythm: Normal rate and regular rhythm.     Pulses: Normal pulses.     Heart sounds: Normal heart sounds.  Pulmonary:     Effort: Pulmonary effort is normal.     Breath sounds: Normal breath sounds.  Skin:    General: Skin is warm and dry.  Neurological:     Mental Status: She is alert and oriented to person, place, and time.  Psychiatric:        Mood and Affect: Mood normal.        Behavior: Behavior normal.        Thought Content: Thought content normal.        Judgment: Judgment normal.      No results found for any visits on 12/10/23.    The ASCVD Risk score (Arnett DK, et al., 2019) failed to calculate for the following reasons:   Risk score cannot be calculated because patient has a medical history suggesting prior/existing ASCVD    Assessment & Plan:  Problem List Items Addressed This Visit       Active Problems   Type 2 diabetes mellitus (HCC) - Primary (Chronic)   Type 2 diabetes with suboptimal control, A1c at 8.5%. Left foot neuropathy managed with gabapentin . - Order A1c test. - Continue metformin , Trulicity , and glipizide . Adjust meds pending labs. - Provide diabetic diet handout. - Encourage blood glucose monitoring. - Continue gabapentin  for neuropathy.      Relevant Orders   Microalbumin / creatinine urine ratio   Comprehensive metabolic panel with GFR   Hemoglobin A1c   Hyperlipidemia (Chronic)   -Medication management: continue lipitor  80 mg and 1400 mg fish oil daily -Repeat lipids in 2-3 months (just recently started fish oil) -Lifestyle factors for lowering cholesterol  include: Diet therapy - heart-healthy diet rich in fruits, veggies, fiber-rich whole grains, lean meats, chicken, fish (at least twice a week), fat-free or 1% dairy products; foods low in saturated/trans fats, cholesterol, sodium, and sugar. Mediterranean diet has shown to be very heart healthy. Regular exercise - recommend at least 30 minutes a day, 5 times per week Weight management        Coronary artery disease (Chronic)   Following with cardiology. No acute concerns today. Continue current regimen.       History of lobectomy of thyroid  (Chronic)   Surgeon requested we recheck TSH at this visit. Asymptomatic other than mild fatigue.       Relevant Orders   Comprehensive metabolic panel with GFR   TSH   Other Visit Diagnoses       Immunization due       Relevant Orders   Flu vaccine HIGH DOSE PF(Fluzone Trivalent) (Completed)     Fatigue, unspecified type       Relevant Orders   CBC with Differential/Platelet   B12 and Folate Panel   IBC + Ferritin           Return in about 3 months (around 03/11/2024) for chronic disease management; schedule AWV.    Waddell KATHEE Mon, NP

## 2023-12-10 NOTE — Assessment & Plan Note (Signed)
Following with cardiology. No acute concerns today. Continue current regimen.  

## 2023-12-10 NOTE — Assessment & Plan Note (Signed)
 Type 2 diabetes with suboptimal control, A1c at 8.5%. Left foot neuropathy managed with gabapentin . - Order A1c test. - Continue metformin , Trulicity , and glipizide . Adjust meds pending labs. - Provide diabetic diet handout. - Encourage blood glucose monitoring. - Continue gabapentin  for neuropathy.

## 2023-12-11 LAB — MICROALBUMIN / CREATININE URINE RATIO
Creatinine,U: 91.2 mg/dL
Microalb Creat Ratio: 12.4 mg/g (ref 0.0–30.0)
Microalb, Ur: 1.1 mg/dL (ref 0.0–1.9)

## 2023-12-11 LAB — CBC WITH DIFFERENTIAL/PLATELET
Basophils Absolute: 0.1 K/uL (ref 0.0–0.1)
Basophils Relative: 1.2 % (ref 0.0–3.0)
Eosinophils Absolute: 0.2 K/uL (ref 0.0–0.7)
Eosinophils Relative: 2.6 % (ref 0.0–5.0)
HCT: 46 % (ref 36.0–46.0)
Hemoglobin: 15.1 g/dL — ABNORMAL HIGH (ref 12.0–15.0)
Lymphocytes Relative: 28.2 % (ref 12.0–46.0)
Lymphs Abs: 2.2 K/uL (ref 0.7–4.0)
MCHC: 32.9 g/dL (ref 30.0–36.0)
MCV: 91.4 fl (ref 78.0–100.0)
Monocytes Absolute: 0.6 K/uL (ref 0.1–1.0)
Monocytes Relative: 8.2 % (ref 3.0–12.0)
Neutro Abs: 4.8 K/uL (ref 1.4–7.7)
Neutrophils Relative %: 59.8 % (ref 43.0–77.0)
Platelets: 219 K/uL (ref 150.0–400.0)
RBC: 5.03 Mil/uL (ref 3.87–5.11)
RDW: 14.8 % (ref 11.5–15.5)
WBC: 8 K/uL (ref 4.0–10.5)

## 2023-12-11 LAB — COMPREHENSIVE METABOLIC PANEL WITH GFR
ALT: 21 U/L (ref 0–35)
AST: 18 U/L (ref 0–37)
Albumin: 4.6 g/dL (ref 3.5–5.2)
Alkaline Phosphatase: 56 U/L (ref 39–117)
BUN: 15 mg/dL (ref 6–23)
CO2: 28 meq/L (ref 19–32)
Calcium: 10.1 mg/dL (ref 8.4–10.5)
Chloride: 102 meq/L (ref 96–112)
Creatinine, Ser: 0.73 mg/dL (ref 0.40–1.20)
GFR: 83.43 mL/min (ref >60.00)
Glucose, Bld: 127 mg/dL — ABNORMAL HIGH (ref 70–99)
Potassium: 4.5 meq/L (ref 3.5–5.1)
Sodium: 143 meq/L (ref 135–145)
Total Bilirubin: 0.7 mg/dL (ref 0.2–1.2)
Total Protein: 7.6 g/dL (ref 6.0–8.3)

## 2023-12-11 LAB — HEMOGLOBIN A1C: Hgb A1c MFr Bld: 7.5 % — ABNORMAL HIGH (ref 4.6–6.5)

## 2023-12-11 LAB — B12 AND FOLATE PANEL
Folate: >22.9 ng/mL (ref >5.9)
Vitamin B-12: 240 pg/mL (ref 211–911)

## 2023-12-11 LAB — IBC + FERRITIN
Ferritin: 11.4 ng/mL (ref 10.0–291.0)
Iron: 105 ug/dL (ref 42–145)
Saturation Ratios: 21.6 % (ref 20.0–50.0)
TIBC: 487.2 ug/dL — ABNORMAL HIGH (ref 250.0–450.0)
Transferrin: 348 mg/dL (ref 212.0–360.0)

## 2023-12-11 LAB — TSH: TSH: 1.77 u[IU]/mL (ref 0.35–5.50)

## 2023-12-12 ENCOUNTER — Other Ambulatory Visit (HOSPITAL_BASED_OUTPATIENT_CLINIC_OR_DEPARTMENT_OTHER): Payer: Self-pay

## 2023-12-12 ENCOUNTER — Ambulatory Visit: Payer: Self-pay | Admitting: Family Medicine

## 2023-12-12 DIAGNOSIS — M546 Pain in thoracic spine: Secondary | ICD-10-CM | POA: Diagnosis not present

## 2023-12-12 DIAGNOSIS — E119 Type 2 diabetes mellitus without complications: Secondary | ICD-10-CM

## 2023-12-12 MED ORDER — GLIPIZIDE ER 5 MG PO TB24
5.0000 mg | ORAL_TABLET | Freq: Every day | ORAL | 1 refills | Status: AC
  Filled 2023-12-12: qty 90, 90d supply, fill #0
  Filled 2024-02-21: qty 90, 90d supply, fill #1

## 2023-12-17 DIAGNOSIS — M546 Pain in thoracic spine: Secondary | ICD-10-CM | POA: Diagnosis not present

## 2023-12-18 ENCOUNTER — Other Ambulatory Visit: Payer: Self-pay | Admitting: *Deleted

## 2023-12-18 DIAGNOSIS — M810 Age-related osteoporosis without current pathological fracture: Secondary | ICD-10-CM

## 2023-12-18 MED ORDER — DENOSUMAB-BBDZ 60 MG/ML ~~LOC~~ SOSY: 60.0000 mg | PREFILLED_SYRINGE | Freq: Once | SUBCUTANEOUS | Status: DC

## 2023-12-22 DIAGNOSIS — M546 Pain in thoracic spine: Secondary | ICD-10-CM | POA: Diagnosis not present

## 2023-12-23 ENCOUNTER — Telehealth: Payer: Self-pay

## 2023-12-23 ENCOUNTER — Other Ambulatory Visit (HOSPITAL_COMMUNITY): Payer: Self-pay

## 2023-12-23 NOTE — Telephone Encounter (Signed)
 Christina Larsen is now preferred for patient's insurance (pharmacy benefit). No PA is required and patient's copay is $594.04.

## 2023-12-24 ENCOUNTER — Other Ambulatory Visit: Payer: Self-pay

## 2023-12-24 ENCOUNTER — Ambulatory Visit

## 2023-12-24 ENCOUNTER — Other Ambulatory Visit (HOSPITAL_BASED_OUTPATIENT_CLINIC_OR_DEPARTMENT_OTHER): Payer: Self-pay

## 2023-12-24 VITALS — Ht 65.0 in | Wt 167.0 lb

## 2023-12-24 DIAGNOSIS — Z860100 Personal history of colon polyps, unspecified: Secondary | ICD-10-CM

## 2023-12-24 MED ORDER — NA SULFATE-K SULFATE-MG SULF 17.5-3.13-1.6 GM/177ML PO SOLN
1.0000 | Freq: Once | ORAL | 0 refills | Status: AC
  Filled 2023-12-24: qty 354, 1d supply, fill #0

## 2023-12-24 NOTE — Progress Notes (Signed)
 Denies allergies to eggs or soy products. Denies complication of anesthesia or sedation. Denies use of weight loss medication. Denies use of O2.   Emmi instructions given for colonoscopy.

## 2023-12-25 ENCOUNTER — Ambulatory Visit: Admitting: Cardiovascular Disease

## 2023-12-25 DIAGNOSIS — M546 Pain in thoracic spine: Secondary | ICD-10-CM | POA: Diagnosis not present

## 2023-12-29 ENCOUNTER — Other Ambulatory Visit: Payer: Self-pay | Admitting: Family Medicine

## 2023-12-29 ENCOUNTER — Other Ambulatory Visit (HOSPITAL_BASED_OUTPATIENT_CLINIC_OR_DEPARTMENT_OTHER): Payer: Self-pay

## 2023-12-29 DIAGNOSIS — M546 Pain in thoracic spine: Secondary | ICD-10-CM | POA: Diagnosis not present

## 2023-12-29 DIAGNOSIS — G629 Polyneuropathy, unspecified: Secondary | ICD-10-CM

## 2023-12-29 MED ORDER — GABAPENTIN 300 MG PO CAPS
300.0000 mg | ORAL_CAPSULE | Freq: Two times a day (BID) | ORAL | 0 refills | Status: AC | PRN
  Filled 2023-12-29: qty 180, 90d supply, fill #0

## 2023-12-30 ENCOUNTER — Encounter: Payer: Self-pay | Admitting: Gastroenterology

## 2023-12-31 ENCOUNTER — Telehealth: Payer: Self-pay

## 2023-12-31 ENCOUNTER — Other Ambulatory Visit (HOSPITAL_COMMUNITY): Payer: Self-pay

## 2023-12-31 ENCOUNTER — Telehealth: Payer: Self-pay | Admitting: Family Medicine

## 2023-12-31 DIAGNOSIS — M546 Pain in thoracic spine: Secondary | ICD-10-CM | POA: Diagnosis not present

## 2023-12-31 NOTE — Telephone Encounter (Signed)
 Pt dropped off papers to be filled out by pcp. Placed papers in pcp box. Please call or my chart pt when papers have been faxed and pt may want to pick up was unsure.

## 2023-12-31 NOTE — Telephone Encounter (Signed)
 Pt ready for scheduling for PROLIA  on or after : 12/31/23  Option# 1: Buy/Bill (Office supplied medication)  Out-of-pocket cost due at time of clinic visit: $332  Number of injection/visits approved: ---  Primary: [employer] Prolia  co-insurance: 20% Admin fee co-insurance: 0%  Secondary: --- Prolia  co-insurance:  Admin fee co-insurance:   Medical Benefit Details: Date Benefits were checked: 12/31/23 Deductible: NO/ Coinsurance: 20%/ Admin Fee: 0%  Prior Auth: N/A PA# Expiration Date:   # of doses approved: ----------------------------------------------------------------------- Option# 2- Med Obtained from pharmacy: Prolia  is no longer preferred for pharmacy benefit. Jubbonti is now preferred. PRICING IS FOR JUBBONTI  Pharmacy benefit: Copay $594.04 (Paid to pharmacy) Admin Fee: 0% (Pay at clinic)  Prior Auth: N/A PA# Expiration Date:   # of doses approved:   If patient wants fill through the pharmacy benefit please send prescription to: [facility], and include estimated need by date in rx notes. Pharmacy will ship medication directly to the office.  Patient NOT eligible for Prolia  Copay Card. Copay Card can make patient's cost as little as $25. Link to apply: https://www.amgensupportplus.com/copay  ** This summary of benefits is an estimation of the patient's out-of-pocket cost. Exact cost may very based on individual plan coverage.

## 2024-01-01 NOTE — Telephone Encounter (Signed)
 Forms completed and faxed to [facility]. Copy of forms at the front for patient pick up per her request. LVM letting patient know. Copy to scan/Copy to hold.

## 2024-01-02 ENCOUNTER — Ambulatory Visit (INDEPENDENT_AMBULATORY_CARE_PROVIDER_SITE_OTHER)

## 2024-01-02 DIAGNOSIS — M810 Age-related osteoporosis without current pathological fracture: Secondary | ICD-10-CM

## 2024-01-02 MED ORDER — DENOSUMAB 60 MG/ML ~~LOC~~ SOSY
60.0000 mg | PREFILLED_SYRINGE | Freq: Once | SUBCUTANEOUS | Status: AC
  Administered 2024-01-02: 60 mg via SUBCUTANEOUS

## 2024-01-02 MED ORDER — DENOSUMAB 60 MG/ML ~~LOC~~ SOSY: 60.0000 mg | PREFILLED_SYRINGE | SUBCUTANEOUS | Status: AC

## 2024-01-02 NOTE — Addendum Note (Signed)
 Addended by: ESTELLE GILLIS D on: 01/02/2024 02:26 PM   Modules accepted: Orders

## 2024-01-02 NOTE — Telephone Encounter (Signed)
 Pt scheduled for today.

## 2024-01-02 NOTE — Progress Notes (Signed)
 Pt was in office today for her Prolia  injection, injection was given subcutaneous in L arm. Pt tolerated well, pt was also informed she will be called to schedule next Prolia  injection.

## 2024-01-05 ENCOUNTER — Encounter: Admitting: Gastroenterology

## 2024-01-08 NOTE — Progress Notes (Signed)
 Christina Larsen                                          MRN: [id number]   01/08/2024   The [facility] Quality Team Specialist reviewed this patient medical record for the purposes of chart review for care gap closure. The following were reviewed: abstraction for care gap closure-kidney health evaluation for diabetes:eGFR  and uACR.    VBCI Quality Team

## 2024-01-09 ENCOUNTER — Encounter: Payer: Self-pay | Admitting: Gastroenterology

## 2024-01-09 ENCOUNTER — Ambulatory Visit (AMBULATORY_SURGERY_CENTER): Admitting: Gastroenterology

## 2024-01-09 VITALS — BP 128/84 | HR 80 | Temp 97.9°F | Resp 22 | Ht 65.0 in | Wt 167.0 lb

## 2024-01-09 DIAGNOSIS — D175 Benign lipomatous neoplasm of intra-abdominal organs: Secondary | ICD-10-CM

## 2024-01-09 DIAGNOSIS — K635 Polyp of colon: Secondary | ICD-10-CM | POA: Diagnosis not present

## 2024-01-09 DIAGNOSIS — Z860101 Personal history of adenomatous and serrated colon polyps: Secondary | ICD-10-CM | POA: Diagnosis not present

## 2024-01-09 DIAGNOSIS — K640 First degree hemorrhoids: Secondary | ICD-10-CM | POA: Diagnosis not present

## 2024-01-09 DIAGNOSIS — I2510 Atherosclerotic heart disease of native coronary artery without angina pectoris: Secondary | ICD-10-CM | POA: Diagnosis not present

## 2024-01-09 DIAGNOSIS — D125 Benign neoplasm of sigmoid colon: Secondary | ICD-10-CM | POA: Diagnosis not present

## 2024-01-09 DIAGNOSIS — Z1211 Encounter for screening for malignant neoplasm of colon: Secondary | ICD-10-CM | POA: Diagnosis not present

## 2024-01-09 DIAGNOSIS — Z860100 Personal history of colon polyps, unspecified: Secondary | ICD-10-CM

## 2024-01-09 DIAGNOSIS — K5730 Diverticulosis of large intestine without perforation or abscess without bleeding: Secondary | ICD-10-CM

## 2024-01-09 DIAGNOSIS — D123 Benign neoplasm of transverse colon: Secondary | ICD-10-CM

## 2024-01-09 DIAGNOSIS — D128 Benign neoplasm of rectum: Secondary | ICD-10-CM | POA: Diagnosis not present

## 2024-01-09 DIAGNOSIS — D124 Benign neoplasm of descending colon: Secondary | ICD-10-CM

## 2024-01-09 DIAGNOSIS — K529 Noninfective gastroenteritis and colitis, unspecified: Secondary | ICD-10-CM

## 2024-01-09 DIAGNOSIS — I10 Essential (primary) hypertension: Secondary | ICD-10-CM | POA: Diagnosis not present

## 2024-01-09 DIAGNOSIS — E119 Type 2 diabetes mellitus without complications: Secondary | ICD-10-CM | POA: Diagnosis not present

## 2024-01-09 MED ORDER — SODIUM CHLORIDE 0.9 % IV SOLN: 500.0000 mL | Freq: Once | INTRAVENOUS | Status: DC

## 2024-01-09 NOTE — Op Note (Signed)
 [facility] Patient Name: Christina Larsen Procedure Date: 01/09/2024 11:36 AM MRN: [id number] Endoscopist: Sandor Flatter , MD, [id number] Age: 68 Referring MD:  Date of Birth: [birth year] Gender: Female Account #: [id number] Procedure:                Colonoscopy Indications:              Surveillance: Personal history of adenomatous                            polyps on last colonoscopy 5 years ago                           Last colonoscopy was 09/2016 by Dr. Trudy at                            [facility] and notable for 5 mm rectal                            hyperplastic polyp and otherwise normal.                            Recommended repeat in 5 years due to prior history                            of adenomatous.                           Prior to that, Colonoscopy in [place] in 2010,                            4mm TA sigmoid, 7yr follow-up recommended.                           Mother had CRC at age 15                           Separately, has been having intermittent loose,                            non-bloody stools. Medicines:                Monitored Anesthesia Care Procedure:                Pre-Anesthesia Assessment:                           - Prior to the procedure, a History and Physical                            was performed, and patient medications and                            allergies were reviewed. The patient's tolerance of                            previous anesthesia was also  reviewed. The risks                            and benefits of the procedure and the sedation                            options and risks were discussed with the patient.                            All questions were answered, and informed consent                            was obtained. Prior Anticoagulants: The patient has                            taken Plavix  (clopidogrel ), last dose was 5 days                            prior to procedure. ASA Grade Assessment:  III - A                            patient with severe systemic disease. After                            reviewing the risks and benefits, the patient was                            deemed in satisfactory condition to undergo the                            procedure.                           After obtaining informed consent, the colonoscope                            was passed under direct vision. Throughout the                            procedure, the patient's blood pressure, pulse, and                            oxygen saturations were monitored continuously. The                            CF [id number] #[id number] was introduced through the anus                            and advanced to the the terminal ileum. The                            colonoscopy was performed without difficulty. The  patient tolerated the procedure well. The quality                            of the bowel preparation was good. The terminal                            ileum, ileocecal valve, appendiceal orifice, and                            rectum were photographed. Scope In: 11:48:27 AM Scope Out: 12:27:09 PM Scope Withdrawal Time: 0 hours 30 minutes 36 seconds  Total Procedure Duration: 0 hours 38 minutes 42 seconds  Findings:                 The perianal and digital rectal examinations were                            normal.                           The ileocecal valve was moderately lipomatous.                            Biopsies were taken with a cold forceps for                            histology. Estimated blood loss was minimal.                           Three sessile polyps were found in the proximal                            sigmoid colon, descending colon and transverse                            colon. The polyps were 3 to 5 mm in size. These                            polyps were removed with a cold snare. Resection                            and retrieval were  complete. Estimated blood loss                            was minimal.                           Three sessile polyps were found in the sigmoid                            colon. The polyps were 3 to 5 mm in size. These                            polyps were removed with a cold snare. Resection  and retrieval were complete. Estimated blood loss                            was minimal.                           Five sessile polyps were found in the rectum. The                            polyps were 3 to 8 mm in size. These polyps were                            removed with a cold snare. Resection and retrieval                            were complete. Estimated blood loss was minimal.                           Multiple small-mouthed diverticula were found in                            the sigmoid colon, descending colon, transverse                            colon and ascending colon.                           Normal mucosa was found in the entire colon.                            Biopsies for histology were taken with a cold                            forceps from the right colon and left colon for                            evaluation of microscopic colitis. Estimated blood                            loss was minimal.                           Non-bleeding internal hemorrhoids were found during                            retroflexion. The hemorrhoids were small and Grade                            I (internal hemorrhoids that do not prolapse).                           The terminal ileum appeared normal. Complications:            No immediate complications. Estimated Blood Loss:     Estimated blood loss was minimal.  Impression:               - Lipomatous ileocecal valve. Biopsied.                           - Three 3 to 5 mm polyps in the proximal sigmoid                            colon, in the descending colon and in the                            transverse  colon, removed with a cold snare.                            Resected and retrieved.                           - Three 3 to 5 mm polyps in the sigmoid colon,                            removed with a cold snare. Resected and retrieved.                           - Five 3 to 8 mm polyps in the rectum, removed with                            a cold snare. Resected and retrieved.                           - Diverticulosis in the sigmoid colon, in the                            descending colon, in the transverse colon and in                            the ascending colon.                           - Otherwise, normal appearing mucosa throughout the                            colon. Biopsied.                           - Non-bleeding internal hemorrhoids.                           - The examined portion of the ileum was normal. Recommendation:           - Patient has a contact number available for                            emergencies. The signs and symptoms of potential  delayed complications were discussed with the                            patient. Return to normal activities tomorrow.                            Written discharge instructions were provided to the                            patient.                           - Resume previous diet.                           - Continue present medications.                           - Resume Plavix  (clopidogrel ) at prior dose                            tomorrow.                           - Use fiber, for example Citrucel, Fibercon, Konsyl                            or Metamucil.                           - Repeat colonoscopy for surveillance based on                            pathology results.                           - Return to GI office PRN. Sandor Flatter, MD 01/09/2024 12:35:21 PM

## 2024-01-09 NOTE — Progress Notes (Signed)
 Transferred to PACU via stretcher, arousing, VSS.

## 2024-01-09 NOTE — Progress Notes (Signed)
 Called to room to assist during endoscopic procedure.  Patient ID and intended procedure confirmed with present staff. Received instructions for my participation in the procedure from the performing physician.

## 2024-01-09 NOTE — Progress Notes (Signed)
 Pt's states no medical or surgical changes since previsit or office visit.

## 2024-01-09 NOTE — Patient Instructions (Addendum)
 Continue present medications. Resume Plavix  (clopidogrel ) at prior dose tomorrow. Use fiber, for example Citrucel, Fibercon, Konsyl or Metamucil. Repeat colonoscopy for surveillance based on pathology results. Return to GI office as needed  Please read over handouts YOU HAD AN ENDOSCOPIC PROCEDURE TODAY AT THE [facility]:   Refer to the procedure report that was given to you for any specific questions about what was found during the examination.  If the procedure report does not answer your questions, please call your gastroenterologist to clarify.  If you requested that your care partner not be given the details of your procedure findings, then the procedure report has been included in a sealed envelope for you to review at your convenience later.  YOU SHOULD EXPECT: Some feelings of bloating in the abdomen. Passage of more gas than usual.  Walking can help get rid of the air that was put into your GI tract during the procedure and reduce the bloating. If you had a lower endoscopy (such as a colonoscopy or flexible sigmoidoscopy) you may notice spotting of blood in your stool or on the toilet paper. If you underwent a bowel prep for your procedure, you may not have a normal bowel movement for a few days.  Please Note:  You might notice some irritation and congestion in your nose or some drainage.  This is from the oxygen used during your procedure.  There is no need for concern and it should clear up in a day or so.  SYMPTOMS TO REPORT IMMEDIATELY:  Following lower endoscopy (colonoscopy or flexible sigmoidoscopy):  Excessive amounts of blood in the stool  Significant tenderness or worsening of abdominal pains  Swelling of the abdomen that is new, acute  Fever of 100F or higher  For urgent or emergent issues, a gastroenterologist can be reached at any hour by calling [phone]. Do not use MyChart messaging for urgent concerns.    DIET:  We do recommend a small meal at  first, but then you may proceed to your regular diet.  Drink plenty of fluids but you should avoid alcoholic beverages for 24 hours.  ACTIVITY:  You should plan to take it easy for the rest of today and you should NOT DRIVE or use heavy machinery until tomorrow (because of the sedation medicines used during the test).    FOLLOW UP: Our staff will call the number listed on your records the next business day following your procedure.  We will call around 7:15- 8:00 am to check on you and address any questions or concerns that you may have regarding the information given to you following your procedure. If we do not reach you, we will leave a message.     If any biopsies were taken you will be contacted by phone or by letter within the next 1-3 weeks.  Please call us  at [phone] if you have not heard about the biopsies in 3 weeks.    SIGNATURES/CONFIDENTIALITY: You and/or your care partner have signed paperwork which will be entered into your electronic medical record.  These signatures attest to the fact that that the information above on your After Visit Summary has been reviewed and is understood.  Full responsibility of the confidentiality of this discharge information lies with you and/or your care-partner.

## 2024-01-09 NOTE — Progress Notes (Signed)
 GASTROENTEROLOGY PROCEDURE H&P NOTE   Primary Care Physician: Almarie Waddell NOVAK, NP    Reason for Procedure:  Colon polyp surveillance  Plan:    Colonoscopy  Patient is appropriate for endoscopic procedure(s) in the ambulatory ([facility]) setting.  The nature of the procedure, as well as the risks, benefits, and alternatives were carefully and thoroughly reviewed with the patient. Ample time for discussion and questions allowed. The patient understood, was satisfied, and agreed to proceed.     HPI: Christina Larsen is a 68 y.o. female who presents for colonoscopy for ongoing colon polyp surveillance and colon cancer screening.  No active GI symptoms.  No known family history of colon cancer or related malignancy.  Patient is otherwise without complaints or active issues today.  Last colonoscopy was 09/2016 by Dr. Trudy at [facility] and notable for 5 mm rectal hyperplastic polyp and otherwise normal.  Recommended repeat in 5 years due to prior history of adenomatous.   Prior to that, Colonoscopy in [place] in 2010, 4mm TA sigmoid, 41yr follow-up recommended.   Mother had CRC at age 18 and maternal grandfather with colon cancer.   Separately, has been having intermittent loose, non-bloody stools.  Has been holding Plavix  x 5 days for procedure today.  Past Medical History:  Diagnosis Date   Allergy    Complication of anesthesia    Coronary artery disease    11/9 NSTEMI, PCI to pLCx with aspiration thrombectomy/DESx1, occluded mRCA with left to right collaterals   Diabetes mellitus without complication (HCC)    type 2   GERD (gastroesophageal reflux disease)    HLD (hyperlipidemia)    Hypertension    Myocardial infarction (HCC)    PONV (postoperative nausea and vomiting)     Past Surgical History:  Procedure Laterality Date   CARDIAC CATHETERIZATION     CHOLECYSTECTOMY  1992   COLONOSCOPY  2018   CORONARY STENT INTERVENTION N/A 01/17/2020   Procedure: CORONARY STENT  INTERVENTION;  Surgeon: Mady Bruckner, MD;  Location: [facility];  Service: Cardiovascular;  Laterality: N/A;   IR KYPHO THORACIC WITH BONE BIOPSY  01/01/2023   LAMINECTOMY  2006   LEFT HEART CATH AND CORONARY ANGIOGRAPHY N/A 01/17/2020   Procedure: LEFT HEART CATH AND CORONARY ANGIOGRAPHY;  Surgeon: Mady Bruckner, MD;  Location: [facility];  Service: Cardiovascular;  Laterality: N/A;   rotator cuff surgery     THYROID  LOBECTOMY Left 03/17/2023   Procedure: LEFT THYROID  LOBECTOMY;  Surgeon: Eletha Boas, MD;  Location: [facility] OR;  Service: General;  Laterality: Left;    Prior to Admission medications   Medication Sig Start Date End Date Taking? Authorizing Provider  acetaminophen  (TYLENOL ) 500 MG tablet Take 500-1,000 mg by mouth every 6 (six) hours as needed (pain.).    [provider]  atorvastatin  (LIPITOR ) 80 MG tablet Take 1 tablet (80 mg total) by mouth daily. 07/29/23   Almarie Waddell NOVAK, NP  cholecalciferol (VITAMIN D3) 25 MCG (1000 UNIT) tablet Take 1,000 Units by mouth in the morning.    [provider]  clopidogrel  (PLAVIX ) 75 MG tablet Take 1 tablet (75 mg total) by mouth daily. 07/29/23   Burnard Debby LABOR, MD  Dulaglutide  (TRULICITY ) 4.5 MG/0.5ML SOPN Inject 4.5 mg as directed once a week. 10/16/22   Almarie Waddell NOVAK, NP  gabapentin  (NEURONTIN ) 300 MG capsule Take 1 capsule (300 mg total) by mouth every 12 (twelve) hours as needed. 12/29/23   Almarie Waddell NOVAK, NP  glipiZIDE  (GLUCOTROL   XL) 5 MG 24 hr tablet Take 1 tablet (5 mg total) by mouth daily with breakfast. 12/12/23   Almarie Waddell NOVAK, NP  metFORMIN  (GLUCOPHAGE ) 1000 MG tablet Take 1 tablet (1,000 mg total) by mouth in the morning and at bedtime. 04/14/23   Almarie Waddell NOVAK, NP  metFORMIN  (GLUCOPHAGE ) 500 MG tablet Take 1 tablet (500 mg total) by mouth daily with breakfast. Take with the 1,000 mg tabs to total 1,500 mg in the morning and 1,000 mg in the evening. 05/02/23   Almarie Waddell NOVAK, NP  metoprolol  succinate  (TOPROL  XL) 50 MG 24 hr tablet Take 1 tablet (50 mg total) by mouth daily. 07/09/23   Burnard Debby LABOR, MD  Multiple Vitamin (MULTIVITAMIN) tablet Take 1 tablet by mouth in the morning.    [provider]  nitroGLYCERIN  (NITROSTAT ) 0.4 MG SL tablet Place 1 tablet (0.4 mg total) under the tongue every 5 (five) minutes x 3 doses as needed. Call 911 after 3rd dose if no relief 04/10/22 12/24/23  Burnard Debby LABOR, MD  Sodium Fluoride  (PREVIDENT  5000 BOOSTER PLUS) 1.1 % PSTE Apply a small amount to teeth twice a day. Use pea-sized amount 2 times daily. Brush for two minutes. Do not eat/drink for 30 minutes post brushing. 11/06/23       Current Outpatient Medications  Medication Sig Dispense Refill   acetaminophen  (TYLENOL ) 500 MG tablet Take 500-1,000 mg by mouth every 6 (six) hours as needed (pain.).     atorvastatin  (LIPITOR ) 80 MG tablet Take 1 tablet (80 mg total) by mouth daily. 90 tablet 3   cholecalciferol (VITAMIN D3) 25 MCG (1000 UNIT) tablet Take 1,000 Units by mouth in the morning.     clopidogrel  (PLAVIX ) 75 MG tablet Take 1 tablet (75 mg total) by mouth daily. 90 tablet 3   Dulaglutide  (TRULICITY ) 4.5 MG/0.5ML SOPN Inject 4.5 mg as directed once a week. 6 mL PRN   gabapentin  (NEURONTIN ) 300 MG capsule Take 1 capsule (300 mg total) by mouth every 12 (twelve) hours as needed. 180 capsule 0   glipiZIDE  (GLUCOTROL  XL) 5 MG 24 hr tablet Take 1 tablet (5 mg total) by mouth daily with breakfast. 90 tablet 1   metFORMIN  (GLUCOPHAGE ) 1000 MG tablet Take 1 tablet (1,000 mg total) by mouth in the morning and at bedtime. 180 tablet 3   metFORMIN  (GLUCOPHAGE ) 500 MG tablet Take 1 tablet (500 mg total) by mouth daily with breakfast. Take with the 1,000 mg tabs to total 1,500 mg in the morning and 1,000 mg in the evening. 90 tablet 3   metoprolol  succinate (TOPROL  XL) 50 MG 24 hr tablet Take 1 tablet (50 mg total) by mouth daily. 90 tablet 3   Multiple Vitamin (MULTIVITAMIN) tablet Take 1 tablet by  mouth in the morning.     nitroGLYCERIN  (NITROSTAT ) 0.4 MG SL tablet Place 1 tablet (0.4 mg total) under the tongue every 5 (five) minutes x 3 doses as needed. Call 911 after 3rd dose if no relief 25 tablet 2   Sodium Fluoride  (PREVIDENT  5000 BOOSTER PLUS) 1.1 % PSTE Apply a small amount to teeth twice a day. Use pea-sized amount 2 times daily. Brush for two minutes. Do not eat/drink for 30 minutes post brushing. 100 mL 5   Current Facility-Administered Medications  Medication Dose Route Frequency Provider Last Rate Last Admin   [START ON 06/30/2024] denosumab  (PROLIA ) injection 60 mg  60 mg Subcutaneous Q6 months Almarie Waddell NOVAK, NP  Allergies as of 01/09/2024 - Review Complete 12/24/2023  Allergen Reaction Noted   Hydrocodone-acetaminophen  Nausea And Vomiting 01/16/2020    Family History  Problem Relation Age of Onset   Arthritis Mother    Diabetes Mother    Colon cancer Mother    Heart disease Father    Prostate cancer Father    Hypercholesterolemia Father    Cancer Brother    Diabetes Brother    Hyperlipidemia Brother    Hypertension Brother    Stomach cancer Maternal Grandmother    Colon cancer Maternal Grandfather    Esophageal cancer Neg Hx    Rectal cancer Neg Hx     Social History   Socioeconomic History   Marital status: Widowed    Spouse name: Not on file   Number of children: 1   Years of education: 15   Highest education level: Associate degree: occupational, scientist, product/process development, or vocational program  Occupational History   Occupation: [employer]  Tobacco Use   Smoking status: Former    Current packs/day: 0.00    Types: Cigarettes    Quit date: 01/16/2020    Years since quitting: 3.9   Smokeless tobacco: Never  Vaping Use   Vaping status: Never Used  Substance and Sexual Activity   Alcohol  use: Yes    Alcohol /week: 6.0 standard drinks of alcohol     Types: 5 Glasses of wine, 1 Shots of liquor per week    Comment: Occasional wine   Drug use: Never    Sexual activity: Not Currently    Birth control/protection: Post-menopausal  Other Topics Concern   Not on file  Social History Narrative   Semi Retired   Social Drivers of Health   Financial Resource Strain: Patient Declined (12/03/2023)   Overall Financial Resource Strain (CARDIA)    Difficulty of Paying Living Expenses: Patient declined  Food Insecurity: Patient Declined (12/03/2023)   Hunger Vital Sign    Worried About Running Out of Food in the Last Year: Patient declined    Ran Out of Food in the Last Year: Patient declined  Transportation Needs: Unknown (12/03/2023)   PRAPARE - Administrator, Civil Service (Medical): Patient declined    Lack of Transportation (Non-Medical): No  Physical Activity: Insufficiently Active (12/03/2023)   Exercise Vital Sign    Days of Exercise per Week: 3 days    Minutes of Exercise per Session: 30 min  Stress: No Stress Concern Present (12/03/2023)   [employer] of Occupational Health - Occupational Stress Questionnaire    Feeling of Stress: Only a little  Social Connections: Socially Isolated (12/03/2023)   Social Connection and Isolation Panel    Frequency of Communication with Friends and Family: Once a week    Frequency of Social Gatherings with Friends and Family: Once a week    Attends Religious Services: Patient declined    Database Administrator or Organizations: Yes    Attends Banker Meetings: Patient declined    Marital Status: Widowed  Intimate Partner Violence: Not At Risk (03/17/2023)   Humiliation, Afraid, Rape, and Kick questionnaire    Fear of Current or Ex-Partner: No    Emotionally Abused: No    Physically Abused: No    Sexually Abused: No    Physical Exam: Vital signs in last 24 hours: @BP  (!) 144/85   Pulse 94   Temp 97.9 F (36.6 C)   Ht 5' 5 (1.651 m)   Wt 167 lb (75.8 kg)   SpO2 94%  BMI 27.79 kg/m  GEN: NAD EYE: Sclerae anicteric ENT: MMM CV: Non-tachycardic Pulm: CTA  b/l GI: Soft, NT/ND NEURO:  Alert & Oriented x 3   Sandor Flatter, DO [facility]   01/09/2024 11:09 AM

## 2024-01-12 ENCOUNTER — Encounter: Payer: Self-pay | Admitting: Radiology

## 2024-01-12 ENCOUNTER — Telehealth: Payer: Self-pay

## 2024-01-12 DIAGNOSIS — M546 Pain in thoracic spine: Secondary | ICD-10-CM | POA: Diagnosis not present

## 2024-01-12 NOTE — Telephone Encounter (Signed)
  Follow up Call-     01/09/2024   11:01 AM  Call back number  Post procedure Call Back phone  # [phone]  Permission to leave phone message Yes     Patient questions:  Do you have a fever, pain , or abdominal swelling? No. Pain Score  0 *  Have you tolerated food without any problems? Yes.    Have you been able to return to your normal activities? Yes.    Do you have any questions about your discharge instructions: Diet   No. Medications  No. Follow up visit  No.  Do you have questions or concerns about your Care? No.  Actions: * If pain score is 4 or above: No action needed, pain <4.

## 2024-01-13 LAB — SURGICAL PATHOLOGY

## 2024-01-14 ENCOUNTER — Ambulatory Visit: Payer: Self-pay | Admitting: Gastroenterology

## 2024-01-14 DIAGNOSIS — M546 Pain in thoracic spine: Secondary | ICD-10-CM | POA: Diagnosis not present

## 2024-01-19 ENCOUNTER — Other Ambulatory Visit: Payer: Self-pay | Admitting: Cardiovascular Disease

## 2024-01-19 ENCOUNTER — Telehealth: Payer: Self-pay | Admitting: Family Medicine

## 2024-01-19 ENCOUNTER — Other Ambulatory Visit (HOSPITAL_BASED_OUTPATIENT_CLINIC_OR_DEPARTMENT_OTHER): Payer: Self-pay

## 2024-01-19 DIAGNOSIS — I214 Non-ST elevation (NSTEMI) myocardial infarction: Secondary | ICD-10-CM

## 2024-01-19 DIAGNOSIS — M546 Pain in thoracic spine: Secondary | ICD-10-CM | POA: Diagnosis not present

## 2024-01-19 MED ORDER — NITROGLYCERIN 0.4 MG SL SUBL
0.4000 mg | SUBLINGUAL_TABLET | SUBLINGUAL | 2 refills | Status: AC | PRN
  Filled 2024-01-19: qty 25, 7d supply, fill #0

## 2024-01-19 NOTE — Telephone Encounter (Signed)
 Copied from CRM [id number]. Topic: Medicare AWV >> Jan 19, 2024 10:34 AM Nathanel DEL wrote: Called LVM 01/19/2024 to schedule AWV. Please schedule office or virtual visits  Nathanel Paschal; Care Guide Ambulatory Clinical Support [place] [facility] Direct Dial: [phone]

## 2024-01-21 ENCOUNTER — Other Ambulatory Visit: Payer: Self-pay

## 2024-01-21 ENCOUNTER — Other Ambulatory Visit (HOSPITAL_BASED_OUTPATIENT_CLINIC_OR_DEPARTMENT_OTHER): Payer: Self-pay

## 2024-01-21 DIAGNOSIS — M546 Pain in thoracic spine: Secondary | ICD-10-CM | POA: Diagnosis not present

## 2024-01-26 DIAGNOSIS — M546 Pain in thoracic spine: Secondary | ICD-10-CM | POA: Diagnosis not present

## 2024-01-28 DIAGNOSIS — M546 Pain in thoracic spine: Secondary | ICD-10-CM | POA: Diagnosis not present

## 2024-02-02 DIAGNOSIS — M546 Pain in thoracic spine: Secondary | ICD-10-CM | POA: Diagnosis not present

## 2024-02-09 DIAGNOSIS — M546 Pain in thoracic spine: Secondary | ICD-10-CM | POA: Diagnosis not present

## 2024-02-12 DIAGNOSIS — M546 Pain in thoracic spine: Secondary | ICD-10-CM | POA: Diagnosis not present

## 2024-02-16 DIAGNOSIS — M546 Pain in thoracic spine: Secondary | ICD-10-CM | POA: Diagnosis not present

## 2024-02-18 DIAGNOSIS — Z1231 Encounter for screening mammogram for malignant neoplasm of breast: Secondary | ICD-10-CM | POA: Diagnosis not present

## 2024-02-19 DIAGNOSIS — M546 Pain in thoracic spine: Secondary | ICD-10-CM | POA: Diagnosis not present

## 2024-02-23 ENCOUNTER — Encounter: Payer: Self-pay | Admitting: Family Medicine

## 2024-02-23 ENCOUNTER — Ambulatory Visit: Admitting: Family Medicine

## 2024-02-23 ENCOUNTER — Other Ambulatory Visit (HOSPITAL_BASED_OUTPATIENT_CLINIC_OR_DEPARTMENT_OTHER): Payer: Self-pay

## 2024-02-23 VITALS — BP 126/82 | HR 93 | Temp 98.0°F | Resp 16 | Ht 65.0 in | Wt 174.0 lb

## 2024-02-23 DIAGNOSIS — L089 Local infection of the skin and subcutaneous tissue, unspecified: Secondary | ICD-10-CM

## 2024-02-23 DIAGNOSIS — L729 Follicular cyst of the skin and subcutaneous tissue, unspecified: Secondary | ICD-10-CM

## 2024-02-23 MED ORDER — TRAMADOL HCL 50 MG PO TABS
25.0000 mg | ORAL_TABLET | Freq: Two times a day (BID) | ORAL | 0 refills | Status: AC | PRN
  Filled 2024-02-23: qty 10, 5d supply, fill #0

## 2024-02-23 MED ORDER — DOXYCYCLINE HYCLATE 100 MG PO TABS
100.0000 mg | ORAL_TABLET | Freq: Two times a day (BID) | ORAL | 0 refills | Status: AC
  Filled 2024-02-23: qty 14, 7d supply, fill #0

## 2024-02-23 NOTE — Progress Notes (Signed)
 Chief Complaint  Patient presents with   Cyst    Cyst    Christina Larsen is a 68 y.o. female here for a skin complaint.  Duration: 1 week Location: L side of abd wall Pruritic? No Painful? No- uncomfortable Drainage? No Sick contacts? No Other associated symptoms: redness last week, steadily larger over time (years ago) Therapies tried thus far: none This happened last year and she required I&D  Past Medical History:  Diagnosis Date   Allergy    Complication of anesthesia    Coronary artery disease    11/9 NSTEMI, PCI to pLCx with aspiration thrombectomy/DESx1, occluded mRCA with left to right collaterals   Diabetes mellitus without complication (HCC)    type 2   GERD (gastroesophageal reflux disease)    HLD (hyperlipidemia)    Hypertension    Myocardial infarction (HCC)    PONV (postoperative nausea and vomiting)     BP 126/82 (BP Location: Left Arm, Patient Position: Sitting)   Pulse 93   Temp 98 F (36.7 C) (Oral)   Resp 16   Ht 5' 5 (1.651 m)   Wt 174 lb (78.9 kg)   SpO2 95%   BMI 28.96 kg/m  Gen: awake, alert, appearing stated age Lungs: No accessory muscle use Skin: Just left of the midline there is an optically indurated area with overlying erythema.  Minimal fluctuance.  Mild TTP. No drainage, excoriation Psych: Age appropriate judgment and insight  Procedure note; incision and drainage Informed consent obtained. The area was cleaned with alcohol . The area was anesthetized with 5 mL of 1% lidocaine  with epinephrine. Once adequate anesthesia was obtained, a vertically linear incision was made with 11 blade scalpel. Approximately 5 mL of purulent material with blood was expressed. Loculations were interrupted with a curved hemostat. The area was packed with approximately 2 inches cm of 1/2 in plain gauze. The area was then dressed with gauze. There were no complications noted. The patient tolerated the procedure well.  Infected cyst of skin - Plan:  doxycycline  (VIBRA -TABS) 100 MG tablet, traMADol  (ULTRAM ) 50 MG tablet, PR INCISION & DRAINAGE ABSCESS SIMPLE/SINGLE  I&D today, tramadol  as needed for pain, doxycycline  for 7 days.  Remove packing in 1 week. Warning signs and symptoms verbalized and written down in AVS.  The patient voiced understanding and agreement to the plan.  Mabel Mt [place], DO 02/23/2024 2:47 PM

## 2024-02-23 NOTE — Patient Instructions (Addendum)
 Do not shower for the rest of the day. When you do wash it, use only soap and water. Do not vigorously scrub.  Keep the area clean and dry.   Things to look out for: increasing pain not relieved by acetaminophen , fevers, spreading redness, drainage of pus, or foul odor.  Let us  know if you need anything.

## 2024-03-01 ENCOUNTER — Ambulatory Visit: Admitting: Family Medicine

## 2024-03-01 ENCOUNTER — Encounter: Payer: Self-pay | Admitting: Family Medicine

## 2024-03-01 VITALS — BP 128/78 | HR 86 | Temp 98.0°F | Resp 16 | Ht 65.0 in | Wt 174.0 lb

## 2024-03-01 DIAGNOSIS — Z5189 Encounter for other specified aftercare: Secondary | ICD-10-CM

## 2024-03-01 NOTE — Patient Instructions (Addendum)
 Keep the area clean and dry overall.   Triple antibiotic ointment starting tomorrow.   Things to look out for: increasing pain not relieved by ibuprofen/acetaminophen , fevers, spreading redness, drainage of pus, or foul odor.  Let us  know if you need anything.

## 2024-03-01 NOTE — Progress Notes (Signed)
 Chief Complaint  Patient presents with   Follow-up    Follow Up    Subjective: Patient is a 68 y.o. female here for wound check.  Had I&D 1 week ago. Here for packing removal. Doing well overall. No fevers, spreading redness, excessive drainage. Packing still in place. Pain better. One dose of doxy left, was compliant otherwise.   Past Medical History:  Diagnosis Date   Allergy    Complication of anesthesia    Coronary artery disease    11/9 NSTEMI, PCI to pLCx with aspiration thrombectomy/DESx1, occluded mRCA with left to right collaterals   Diabetes mellitus without complication (HCC)    type 2   GERD (gastroesophageal reflux disease)    HLD (hyperlipidemia)    Hypertension    Myocardial infarction (HCC)    PONV (postoperative nausea and vomiting)     Objective: BP 128/78 (BP Location: Left Arm, Patient Position: Sitting)   Pulse 86   Temp 98 F (36.7 C) (Oral)   Resp 16   Ht 5' 5 (1.651 m)   Wt 174 lb (78.9 kg)   SpO2 96%   BMI 28.96 kg/m  General: Awake, appears stated age Abd: Small opening with surrounding erythema; no fluctuance, drainage, excessive warmth; mild ttp Lungs: No accessory muscle use Psych: Age appropriate judgment and insight, normal affect and mood  Assessment and Plan: Wound check, abscess  Packing pulled today. Finish abx. Continuing care discussed. Let me know if there are issues.  The patient voiced understanding and agreement to the plan.  Mabel Mt [place], DO 03/01/2024  3:52 PM

## 2024-03-23 ENCOUNTER — Encounter: Payer: Self-pay | Admitting: Family Medicine

## 2024-03-23 DIAGNOSIS — M439 Deforming dorsopathy, unspecified: Secondary | ICD-10-CM

## 2024-03-23 NOTE — Telephone Encounter (Signed)
"  Referral pended   "

## 2024-03-29 ENCOUNTER — Encounter: Payer: Self-pay | Admitting: Family Medicine

## 2024-04-14 ENCOUNTER — Other Ambulatory Visit (HOSPITAL_BASED_OUTPATIENT_CLINIC_OR_DEPARTMENT_OTHER): Payer: Self-pay

## 2024-04-14 ENCOUNTER — Other Ambulatory Visit: Payer: Self-pay | Admitting: Family Medicine

## 2024-04-14 DIAGNOSIS — E119 Type 2 diabetes mellitus without complications: Secondary | ICD-10-CM

## 2024-04-14 MED ORDER — METFORMIN HCL 500 MG PO TABS
500.0000 mg | ORAL_TABLET | Freq: Every day | ORAL | 3 refills | Status: AC
  Filled 2024-04-14: qty 90, 90d supply, fill #0
